# Patient Record
Sex: Male | Born: 1935 | Race: Black or African American | Hispanic: No | Smoking: Never smoker
Health system: Southern US, Community
[De-identification: ages and names within clinical notes are randomized; demographics above are authoritative.]

## PROBLEM LIST (undated history)

## (undated) DIAGNOSIS — Z8546 Personal history of malignant neoplasm of prostate: Secondary | ICD-10-CM

## (undated) DIAGNOSIS — E119 Type 2 diabetes mellitus without complications: Secondary | ICD-10-CM

## (undated) DIAGNOSIS — L97909 Non-pressure chronic ulcer of unspecified part of unspecified lower leg with unspecified severity: Secondary | ICD-10-CM

## (undated) DIAGNOSIS — R5381 Other malaise: Secondary | ICD-10-CM

## (undated) DIAGNOSIS — K219 Gastro-esophageal reflux disease without esophagitis: Secondary | ICD-10-CM

## (undated) DIAGNOSIS — K122 Cellulitis and abscess of mouth: Secondary | ICD-10-CM

## (undated) DIAGNOSIS — Z950 Presence of cardiac pacemaker: Secondary | ICD-10-CM

## (undated) DIAGNOSIS — R972 Elevated prostate specific antigen [PSA]: Secondary | ICD-10-CM

## (undated) DIAGNOSIS — R339 Retention of urine, unspecified: Secondary | ICD-10-CM

## (undated) DIAGNOSIS — R110 Nausea: Secondary | ICD-10-CM

## (undated) DIAGNOSIS — M549 Dorsalgia, unspecified: Secondary | ICD-10-CM

## (undated) DIAGNOSIS — K0500 Acute gingivitis, plaque induced: Secondary | ICD-10-CM

## (undated) DIAGNOSIS — I10 Essential (primary) hypertension: Secondary | ICD-10-CM

## (undated) DIAGNOSIS — L0292 Furuncle, unspecified: Secondary | ICD-10-CM

## (undated) DIAGNOSIS — I219 Acute myocardial infarction, unspecified: Secondary | ICD-10-CM

## (undated) DIAGNOSIS — J309 Allergic rhinitis, unspecified: Secondary | ICD-10-CM

## (undated) DIAGNOSIS — L03317 Cellulitis of buttock: Secondary | ICD-10-CM

## (undated) DIAGNOSIS — Z95810 Presence of automatic (implantable) cardiac defibrillator: Secondary | ICD-10-CM

## (undated) DIAGNOSIS — K5730 Diverticulosis of large intestine without perforation or abscess without bleeding: Secondary | ICD-10-CM

## (undated) DIAGNOSIS — Z86010 Personal history of colonic polyps: Secondary | ICD-10-CM

## (undated) DIAGNOSIS — Q2733 Arteriovenous malformation of digestive system vessel: Secondary | ICD-10-CM

## (undated) DIAGNOSIS — K209 Esophagitis, unspecified: Secondary | ICD-10-CM

## (undated) DIAGNOSIS — F22 Delusional disorders: Secondary | ICD-10-CM

## (undated) DIAGNOSIS — L0231 Cutaneous abscess of buttock: Secondary | ICD-10-CM

## (undated) DIAGNOSIS — N259 Disorder resulting from impaired renal tubular function, unspecified: Secondary | ICD-10-CM

## (undated) DIAGNOSIS — L0293 Carbuncle, unspecified: Secondary | ICD-10-CM

## (undated) DIAGNOSIS — N390 Urinary tract infection, site not specified: Secondary | ICD-10-CM

## (undated) DIAGNOSIS — I2510 Atherosclerotic heart disease of native coronary artery without angina pectoris: Secondary | ICD-10-CM

## (undated) DIAGNOSIS — R5383 Other fatigue: Secondary | ICD-10-CM

## (undated) DIAGNOSIS — I209 Angina pectoris, unspecified: Secondary | ICD-10-CM

## (undated) DIAGNOSIS — I739 Peripheral vascular disease, unspecified: Secondary | ICD-10-CM

## (undated) DIAGNOSIS — I252 Old myocardial infarction: Secondary | ICD-10-CM

## (undated) DIAGNOSIS — D130 Benign neoplasm of esophagus: Secondary | ICD-10-CM

## (undated) DIAGNOSIS — E785 Hyperlipidemia, unspecified: Secondary | ICD-10-CM

## (undated) DIAGNOSIS — B3781 Candidal esophagitis: Secondary | ICD-10-CM

## (undated) DIAGNOSIS — R209 Unspecified disturbances of skin sensation: Secondary | ICD-10-CM

## (undated) DIAGNOSIS — N400 Enlarged prostate without lower urinary tract symptoms: Secondary | ICD-10-CM

## (undated) DIAGNOSIS — J13 Pneumonia due to Streptococcus pneumoniae: Secondary | ICD-10-CM

## (undated) DIAGNOSIS — R300 Dysuria: Secondary | ICD-10-CM

## (undated) DIAGNOSIS — E039 Hypothyroidism, unspecified: Secondary | ICD-10-CM

## (undated) DIAGNOSIS — R079 Chest pain, unspecified: Secondary | ICD-10-CM

## (undated) DIAGNOSIS — I5022 Chronic systolic (congestive) heart failure: Secondary | ICD-10-CM

## (undated) DIAGNOSIS — T18108A Unspecified foreign body in esophagus causing other injury, initial encounter: Secondary | ICD-10-CM

## (undated) DIAGNOSIS — G4710 Hypersomnia, unspecified: Secondary | ICD-10-CM

## (undated) DIAGNOSIS — S98919A Complete traumatic amputation of unspecified foot, level unspecified, initial encounter: Secondary | ICD-10-CM

## (undated) HISTORY — DX: Acute myocardial infarction, unspecified: I21.9

## (undated) HISTORY — DX: Urinary tract infection, site not specified: N39.0

## (undated) HISTORY — DX: Cellulitis of buttock: L03.317

## (undated) HISTORY — DX: Diverticulosis of large intestine without perforation or abscess without bleeding: K57.30

## (undated) HISTORY — DX: Benign prostatic hyperplasia without lower urinary tract symptoms: N40.0

## (undated) HISTORY — DX: Complete traumatic amputation of unspecified foot, level unspecified, initial encounter: S98.919A

## (undated) HISTORY — DX: Peripheral vascular disease, unspecified: I73.9

## (undated) HISTORY — DX: Candidal esophagitis: B37.81

## (undated) HISTORY — DX: Chronic systolic (congestive) heart failure: I50.22

## (undated) HISTORY — DX: Nausea: R11.0

## (undated) HISTORY — DX: Retention of urine, unspecified: R33.9

## (undated) HISTORY — PX: OTHER SURGICAL HISTORY: SHX169

## (undated) HISTORY — PX: CATARACT EXTRACTION: SUR2

## (undated) HISTORY — DX: Dorsalgia, unspecified: M54.9

## (undated) HISTORY — DX: Benign neoplasm of esophagus: D13.0

## (undated) HISTORY — DX: Hypersomnia, unspecified: G47.10

## (undated) HISTORY — DX: Dysuria: R30.0

## (undated) HISTORY — DX: Personal history of malignant neoplasm of prostate: Z85.46

## (undated) HISTORY — DX: Hypothyroidism, unspecified: E03.9

## (undated) HISTORY — DX: Old myocardial infarction: I25.2

## (undated) HISTORY — DX: Disorder resulting from impaired renal tubular function, unspecified: N25.9

## (undated) HISTORY — DX: Pneumonia due to Streptococcus pneumoniae: J13

## (undated) HISTORY — DX: Delusional disorders: F22

## (undated) HISTORY — DX: Arteriovenous malformation of digestive system vessel: Q27.33

## (undated) HISTORY — DX: Cutaneous abscess of buttock: L02.31

## (undated) HISTORY — DX: Elevated prostate specific antigen (PSA): R97.20

## (undated) HISTORY — DX: Unspecified foreign body in esophagus causing other injury, initial encounter: T18.108A

## (undated) HISTORY — DX: Type 2 diabetes mellitus without complications: E11.9

## (undated) HISTORY — DX: Other fatigue: R53.83

## (undated) HISTORY — DX: Esophagitis, unspecified: K20.9

## (undated) HISTORY — DX: Unspecified disturbances of skin sensation: R20.9

## (undated) HISTORY — PX: CORONARY ANGIOPLASTY WITH STENT PLACEMENT: SHX49

## (undated) HISTORY — DX: Personal history of colonic polyps: Z86.010

## (undated) HISTORY — DX: Other malaise: R53.81

## (undated) HISTORY — PX: INSERT / REPLACE / REMOVE PACEMAKER: SUR710

## (undated) HISTORY — DX: Chest pain, unspecified: R07.9

## (undated) HISTORY — DX: Hyperlipidemia, unspecified: E78.5

## (undated) HISTORY — DX: Essential (primary) hypertension: I10

## (undated) HISTORY — DX: Presence of automatic (implantable) cardiac defibrillator: Z95.810

## (undated) HISTORY — DX: Gastro-esophageal reflux disease without esophagitis: K21.9

## (undated) HISTORY — DX: Acute gingivitis, plaque induced: K05.00

## (undated) HISTORY — DX: Carbuncle, unspecified: L02.93

## (undated) HISTORY — DX: Cellulitis and abscess of mouth: K12.2

## (undated) HISTORY — DX: Atherosclerotic heart disease of native coronary artery without angina pectoris: I25.10

## (undated) HISTORY — DX: Furuncle, unspecified: L02.92

## (undated) HISTORY — DX: Allergic rhinitis, unspecified: J30.9

## (undated) HISTORY — DX: Non-pressure chronic ulcer of unspecified part of unspecified lower leg with unspecified severity: L97.909

---

## 1967-11-30 HISTORY — PX: LAPAROTOMY: SHX154

## 1998-03-11 ENCOUNTER — Observation Stay (HOSPITAL_COMMUNITY): Admission: EM | Admit: 1998-03-11 | Discharge: 1998-03-12 | Payer: Self-pay | Admitting: Emergency Medicine

## 1998-06-28 ENCOUNTER — Ambulatory Visit (HOSPITAL_COMMUNITY): Admission: RE | Admit: 1998-06-28 | Discharge: 1998-06-28 | Payer: Self-pay | Admitting: Internal Medicine

## 1998-07-14 ENCOUNTER — Ambulatory Visit (HOSPITAL_COMMUNITY): Admission: RE | Admit: 1998-07-14 | Discharge: 1998-07-14 | Payer: Self-pay | Admitting: Internal Medicine

## 1999-02-15 ENCOUNTER — Encounter: Payer: Self-pay | Admitting: Emergency Medicine

## 1999-02-15 ENCOUNTER — Inpatient Hospital Stay (HOSPITAL_COMMUNITY): Admission: EM | Admit: 1999-02-15 | Discharge: 1999-02-22 | Payer: Self-pay | Admitting: Emergency Medicine

## 1999-02-16 ENCOUNTER — Encounter: Payer: Self-pay | Admitting: Emergency Medicine

## 1999-02-17 ENCOUNTER — Encounter: Payer: Self-pay | Admitting: Internal Medicine

## 1999-03-19 ENCOUNTER — Emergency Department (HOSPITAL_COMMUNITY): Admission: EM | Admit: 1999-03-19 | Discharge: 1999-03-19 | Payer: Self-pay | Admitting: Emergency Medicine

## 1999-04-09 ENCOUNTER — Emergency Department (HOSPITAL_COMMUNITY): Admission: EM | Admit: 1999-04-09 | Discharge: 1999-04-09 | Payer: Self-pay | Admitting: Emergency Medicine

## 1999-04-12 ENCOUNTER — Inpatient Hospital Stay (HOSPITAL_COMMUNITY): Admission: EM | Admit: 1999-04-12 | Discharge: 1999-04-20 | Payer: Self-pay | Admitting: Emergency Medicine

## 1999-04-15 ENCOUNTER — Encounter (HOSPITAL_BASED_OUTPATIENT_CLINIC_OR_DEPARTMENT_OTHER): Payer: Self-pay | Admitting: General Surgery

## 1999-05-18 ENCOUNTER — Inpatient Hospital Stay (HOSPITAL_COMMUNITY): Admission: RE | Admit: 1999-05-18 | Discharge: 1999-05-19 | Payer: Self-pay | Admitting: General Surgery

## 2000-02-11 ENCOUNTER — Encounter: Payer: Self-pay | Admitting: Emergency Medicine

## 2000-02-11 ENCOUNTER — Inpatient Hospital Stay (HOSPITAL_COMMUNITY): Admission: EM | Admit: 2000-02-11 | Discharge: 2000-02-13 | Payer: Self-pay | Admitting: Emergency Medicine

## 2000-05-25 ENCOUNTER — Emergency Department (HOSPITAL_COMMUNITY): Admission: EM | Admit: 2000-05-25 | Discharge: 2000-05-25 | Payer: Self-pay | Admitting: Emergency Medicine

## 2000-06-11 ENCOUNTER — Emergency Department (HOSPITAL_COMMUNITY): Admission: EM | Admit: 2000-06-11 | Discharge: 2000-06-11 | Payer: Self-pay | Admitting: *Deleted

## 2000-06-19 ENCOUNTER — Encounter: Payer: Self-pay | Admitting: *Deleted

## 2000-06-19 ENCOUNTER — Encounter: Payer: Self-pay | Admitting: Cardiology

## 2000-06-19 ENCOUNTER — Ambulatory Visit (HOSPITAL_COMMUNITY): Admission: RE | Admit: 2000-06-19 | Discharge: 2000-06-19 | Payer: Self-pay | Admitting: Cardiology

## 2000-06-19 ENCOUNTER — Inpatient Hospital Stay (HOSPITAL_COMMUNITY): Admission: EM | Admit: 2000-06-19 | Discharge: 2000-06-23 | Payer: Self-pay | Admitting: Internal Medicine

## 2000-06-20 ENCOUNTER — Encounter: Payer: Self-pay | Admitting: Cardiology

## 2000-06-21 ENCOUNTER — Encounter: Payer: Self-pay | Admitting: Internal Medicine

## 2000-07-13 ENCOUNTER — Encounter: Payer: Self-pay | Admitting: Emergency Medicine

## 2000-07-14 ENCOUNTER — Inpatient Hospital Stay (HOSPITAL_COMMUNITY): Admission: EM | Admit: 2000-07-14 | Discharge: 2000-07-14 | Payer: Self-pay | Admitting: Emergency Medicine

## 2000-08-13 ENCOUNTER — Encounter: Payer: Self-pay | Admitting: *Deleted

## 2000-08-13 ENCOUNTER — Emergency Department (HOSPITAL_COMMUNITY): Admission: EM | Admit: 2000-08-13 | Discharge: 2000-08-13 | Payer: Self-pay | Admitting: Emergency Medicine

## 2000-08-15 ENCOUNTER — Inpatient Hospital Stay (HOSPITAL_COMMUNITY): Admission: EM | Admit: 2000-08-15 | Discharge: 2000-08-17 | Payer: Self-pay | Admitting: Emergency Medicine

## 2000-09-25 ENCOUNTER — Emergency Department (HOSPITAL_COMMUNITY): Admission: EM | Admit: 2000-09-25 | Discharge: 2000-09-25 | Payer: Self-pay | Admitting: Emergency Medicine

## 2000-10-25 ENCOUNTER — Encounter: Admission: RE | Admit: 2000-10-25 | Discharge: 2000-10-25 | Payer: Self-pay | Admitting: Urology

## 2000-10-25 ENCOUNTER — Encounter: Payer: Self-pay | Admitting: Urology

## 2000-10-27 ENCOUNTER — Encounter (INDEPENDENT_AMBULATORY_CARE_PROVIDER_SITE_OTHER): Payer: Self-pay | Admitting: *Deleted

## 2000-10-27 ENCOUNTER — Ambulatory Visit (HOSPITAL_BASED_OUTPATIENT_CLINIC_OR_DEPARTMENT_OTHER): Admission: RE | Admit: 2000-10-27 | Discharge: 2000-10-27 | Payer: Self-pay | Admitting: Urology

## 2000-11-29 HISTORY — PX: BELOW KNEE LEG AMPUTATION: SUR23

## 2001-04-01 ENCOUNTER — Emergency Department (HOSPITAL_COMMUNITY): Admission: EM | Admit: 2001-04-01 | Discharge: 2001-04-01 | Payer: Self-pay | Admitting: *Deleted

## 2001-04-04 ENCOUNTER — Encounter (INDEPENDENT_AMBULATORY_CARE_PROVIDER_SITE_OTHER): Payer: Self-pay | Admitting: Specialist

## 2001-04-04 ENCOUNTER — Inpatient Hospital Stay (HOSPITAL_COMMUNITY): Admission: RE | Admit: 2001-04-04 | Discharge: 2001-04-06 | Payer: Self-pay | Admitting: General Surgery

## 2001-04-20 ENCOUNTER — Encounter: Payer: Self-pay | Admitting: Emergency Medicine

## 2001-04-21 ENCOUNTER — Inpatient Hospital Stay (HOSPITAL_COMMUNITY): Admission: EM | Admit: 2001-04-21 | Discharge: 2001-04-24 | Payer: Self-pay | Admitting: Emergency Medicine

## 2001-06-04 ENCOUNTER — Emergency Department (HOSPITAL_COMMUNITY): Admission: EM | Admit: 2001-06-04 | Discharge: 2001-06-04 | Payer: Self-pay | Admitting: Emergency Medicine

## 2001-07-01 ENCOUNTER — Emergency Department (HOSPITAL_COMMUNITY): Admission: EM | Admit: 2001-07-01 | Discharge: 2001-07-01 | Payer: Self-pay | Admitting: Emergency Medicine

## 2001-07-04 ENCOUNTER — Encounter (HOSPITAL_BASED_OUTPATIENT_CLINIC_OR_DEPARTMENT_OTHER): Payer: Self-pay | Admitting: General Surgery

## 2001-07-04 ENCOUNTER — Inpatient Hospital Stay (HOSPITAL_COMMUNITY): Admission: EM | Admit: 2001-07-04 | Discharge: 2001-07-08 | Payer: Self-pay | Admitting: Emergency Medicine

## 2001-07-04 ENCOUNTER — Encounter (INDEPENDENT_AMBULATORY_CARE_PROVIDER_SITE_OTHER): Payer: Self-pay | Admitting: *Deleted

## 2001-07-18 ENCOUNTER — Encounter (INDEPENDENT_AMBULATORY_CARE_PROVIDER_SITE_OTHER): Payer: Self-pay | Admitting: *Deleted

## 2001-07-18 ENCOUNTER — Inpatient Hospital Stay (HOSPITAL_COMMUNITY): Admission: AD | Admit: 2001-07-18 | Discharge: 2001-08-12 | Payer: Self-pay | Admitting: General Surgery

## 2001-07-19 ENCOUNTER — Encounter (HOSPITAL_BASED_OUTPATIENT_CLINIC_OR_DEPARTMENT_OTHER): Payer: Self-pay | Admitting: General Surgery

## 2001-08-12 ENCOUNTER — Inpatient Hospital Stay (HOSPITAL_COMMUNITY)
Admission: RE | Admit: 2001-08-12 | Discharge: 2001-08-18 | Payer: Self-pay | Admitting: Physical Medicine & Rehabilitation

## 2001-08-26 ENCOUNTER — Encounter: Payer: Self-pay | Admitting: Emergency Medicine

## 2001-08-26 ENCOUNTER — Inpatient Hospital Stay (HOSPITAL_COMMUNITY): Admission: EM | Admit: 2001-08-26 | Discharge: 2001-09-01 | Payer: Self-pay | Admitting: Emergency Medicine

## 2001-09-26 ENCOUNTER — Encounter
Admission: RE | Admit: 2001-09-26 | Discharge: 2001-12-25 | Payer: Self-pay | Admitting: Physical Medicine & Rehabilitation

## 2001-10-19 ENCOUNTER — Ambulatory Visit (HOSPITAL_COMMUNITY): Admission: RE | Admit: 2001-10-19 | Discharge: 2001-10-19 | Payer: Self-pay | Admitting: Gastroenterology

## 2001-10-19 ENCOUNTER — Encounter (INDEPENDENT_AMBULATORY_CARE_PROVIDER_SITE_OTHER): Payer: Self-pay | Admitting: *Deleted

## 2002-02-07 ENCOUNTER — Encounter: Payer: Self-pay | Admitting: Cardiology

## 2002-02-07 ENCOUNTER — Ambulatory Visit (HOSPITAL_COMMUNITY): Admission: RE | Admit: 2002-02-07 | Discharge: 2002-02-07 | Payer: Self-pay | Admitting: Cardiology

## 2002-03-01 ENCOUNTER — Inpatient Hospital Stay (HOSPITAL_COMMUNITY): Admission: EM | Admit: 2002-03-01 | Discharge: 2002-03-02 | Payer: Self-pay | Admitting: Emergency Medicine

## 2002-03-01 ENCOUNTER — Encounter: Payer: Self-pay | Admitting: Emergency Medicine

## 2002-03-02 ENCOUNTER — Encounter: Payer: Self-pay | Admitting: Cardiology

## 2002-05-09 ENCOUNTER — Encounter: Payer: Self-pay | Admitting: Emergency Medicine

## 2002-05-09 ENCOUNTER — Inpatient Hospital Stay (HOSPITAL_COMMUNITY): Admission: EM | Admit: 2002-05-09 | Discharge: 2002-05-12 | Payer: Self-pay | Admitting: Emergency Medicine

## 2002-05-11 ENCOUNTER — Encounter: Payer: Self-pay | Admitting: Cardiology

## 2002-06-07 ENCOUNTER — Ambulatory Visit (HOSPITAL_COMMUNITY): Admission: RE | Admit: 2002-06-07 | Discharge: 2002-06-07 | Payer: Self-pay | Admitting: Cardiology

## 2002-07-31 ENCOUNTER — Emergency Department (HOSPITAL_COMMUNITY): Admission: EM | Admit: 2002-07-31 | Discharge: 2002-07-31 | Payer: Self-pay | Admitting: Emergency Medicine

## 2002-07-31 ENCOUNTER — Encounter: Payer: Self-pay | Admitting: Emergency Medicine

## 2002-10-04 ENCOUNTER — Emergency Department (HOSPITAL_COMMUNITY): Admission: EM | Admit: 2002-10-04 | Discharge: 2002-10-04 | Payer: Self-pay

## 2002-10-04 ENCOUNTER — Encounter: Payer: Self-pay | Admitting: Emergency Medicine

## 2003-01-07 ENCOUNTER — Inpatient Hospital Stay (HOSPITAL_COMMUNITY): Admission: EM | Admit: 2003-01-07 | Discharge: 2003-01-11 | Payer: Self-pay | Admitting: Emergency Medicine

## 2003-01-07 ENCOUNTER — Encounter: Payer: Self-pay | Admitting: Emergency Medicine

## 2003-01-09 ENCOUNTER — Encounter: Payer: Self-pay | Admitting: Cardiology

## 2003-04-17 ENCOUNTER — Emergency Department (HOSPITAL_COMMUNITY): Admission: EM | Admit: 2003-04-17 | Discharge: 2003-04-17 | Payer: Self-pay | Admitting: Emergency Medicine

## 2003-08-13 ENCOUNTER — Emergency Department (HOSPITAL_COMMUNITY): Admission: EM | Admit: 2003-08-13 | Discharge: 2003-08-13 | Payer: Self-pay

## 2003-09-02 ENCOUNTER — Encounter: Payer: Self-pay | Admitting: Cardiology

## 2003-09-02 ENCOUNTER — Ambulatory Visit (HOSPITAL_COMMUNITY): Admission: RE | Admit: 2003-09-02 | Discharge: 2003-09-02 | Payer: Self-pay | Admitting: Cardiology

## 2003-09-10 ENCOUNTER — Inpatient Hospital Stay (HOSPITAL_COMMUNITY): Admission: RE | Admit: 2003-09-10 | Discharge: 2003-09-13 | Payer: Self-pay | Admitting: Cardiology

## 2003-10-26 ENCOUNTER — Emergency Department (HOSPITAL_COMMUNITY): Admission: EM | Admit: 2003-10-26 | Discharge: 2003-10-26 | Payer: Self-pay | Admitting: Emergency Medicine

## 2003-11-19 ENCOUNTER — Ambulatory Visit (HOSPITAL_COMMUNITY): Admission: RE | Admit: 2003-11-19 | Discharge: 2003-11-20 | Payer: Self-pay | Admitting: Cardiology

## 2004-07-02 ENCOUNTER — Emergency Department (HOSPITAL_COMMUNITY): Admission: EM | Admit: 2004-07-02 | Discharge: 2004-07-02 | Payer: Self-pay | Admitting: Emergency Medicine

## 2004-07-18 ENCOUNTER — Emergency Department (HOSPITAL_COMMUNITY): Admission: EM | Admit: 2004-07-18 | Discharge: 2004-07-19 | Payer: Self-pay | Admitting: Emergency Medicine

## 2004-09-04 ENCOUNTER — Inpatient Hospital Stay (HOSPITAL_COMMUNITY): Admission: EM | Admit: 2004-09-04 | Discharge: 2004-09-05 | Payer: Self-pay

## 2004-09-22 ENCOUNTER — Ambulatory Visit (HOSPITAL_COMMUNITY): Admission: RE | Admit: 2004-09-22 | Discharge: 2004-09-22 | Payer: Self-pay | Admitting: Gastroenterology

## 2004-09-22 ENCOUNTER — Encounter (INDEPENDENT_AMBULATORY_CARE_PROVIDER_SITE_OTHER): Payer: Self-pay | Admitting: *Deleted

## 2004-09-22 LAB — HM COLONOSCOPY

## 2005-01-28 ENCOUNTER — Ambulatory Visit: Payer: Self-pay | Admitting: Emergency Medicine

## 2005-01-28 ENCOUNTER — Emergency Department (HOSPITAL_COMMUNITY): Admission: EM | Admit: 2005-01-28 | Discharge: 2005-01-29 | Payer: Self-pay | Admitting: Emergency Medicine

## 2005-01-29 DIAGNOSIS — T18108A Unspecified foreign body in esophagus causing other injury, initial encounter: Secondary | ICD-10-CM

## 2005-01-29 HISTORY — DX: Unspecified foreign body in esophagus causing other injury, initial encounter: T18.108A

## 2005-02-10 ENCOUNTER — Ambulatory Visit: Payer: Self-pay | Admitting: Gastroenterology

## 2005-02-16 ENCOUNTER — Emergency Department (HOSPITAL_COMMUNITY): Admission: EM | Admit: 2005-02-16 | Discharge: 2005-02-16 | Payer: Self-pay | Admitting: *Deleted

## 2005-02-19 ENCOUNTER — Encounter (INDEPENDENT_AMBULATORY_CARE_PROVIDER_SITE_OTHER): Payer: Self-pay | Admitting: Specialist

## 2005-02-19 ENCOUNTER — Ambulatory Visit (HOSPITAL_COMMUNITY): Admission: RE | Admit: 2005-02-19 | Discharge: 2005-02-19 | Payer: Self-pay | Admitting: Gastroenterology

## 2005-05-22 ENCOUNTER — Emergency Department (HOSPITAL_COMMUNITY): Admission: EM | Admit: 2005-05-22 | Discharge: 2005-05-22 | Payer: Self-pay | Admitting: Emergency Medicine

## 2005-06-15 ENCOUNTER — Encounter: Admission: RE | Admit: 2005-06-15 | Discharge: 2005-06-15 | Payer: Self-pay | Admitting: General Surgery

## 2005-07-16 ENCOUNTER — Emergency Department (HOSPITAL_COMMUNITY): Admission: EM | Admit: 2005-07-16 | Discharge: 2005-07-16 | Payer: Self-pay | Admitting: Emergency Medicine

## 2005-07-21 ENCOUNTER — Ambulatory Visit: Payer: Self-pay | Admitting: Gastroenterology

## 2005-07-31 ENCOUNTER — Inpatient Hospital Stay (HOSPITAL_COMMUNITY): Admission: EM | Admit: 2005-07-31 | Discharge: 2005-08-04 | Payer: Self-pay | Admitting: Emergency Medicine

## 2005-08-09 ENCOUNTER — Ambulatory Visit (HOSPITAL_COMMUNITY): Admission: RE | Admit: 2005-08-09 | Discharge: 2005-08-09 | Payer: Self-pay | Admitting: *Deleted

## 2006-01-24 ENCOUNTER — Observation Stay (HOSPITAL_COMMUNITY): Admission: EM | Admit: 2006-01-24 | Discharge: 2006-01-25 | Payer: Self-pay | Admitting: Emergency Medicine

## 2006-01-24 ENCOUNTER — Encounter (INDEPENDENT_AMBULATORY_CARE_PROVIDER_SITE_OTHER): Payer: Self-pay | Admitting: Interventional Cardiology

## 2006-03-14 ENCOUNTER — Emergency Department (HOSPITAL_COMMUNITY): Admission: EM | Admit: 2006-03-14 | Discharge: 2006-03-15 | Payer: Self-pay | Admitting: Emergency Medicine

## 2006-04-07 ENCOUNTER — Encounter: Admission: RE | Admit: 2006-04-07 | Discharge: 2006-04-07 | Payer: Self-pay | Admitting: Cardiology

## 2006-04-11 ENCOUNTER — Ambulatory Visit (HOSPITAL_COMMUNITY): Admission: RE | Admit: 2006-04-11 | Discharge: 2006-04-11 | Payer: Self-pay | Admitting: Cardiology

## 2006-05-25 ENCOUNTER — Ambulatory Visit (HOSPITAL_COMMUNITY): Admission: RE | Admit: 2006-05-25 | Discharge: 2006-05-26 | Payer: Self-pay | Admitting: Cardiology

## 2006-10-11 ENCOUNTER — Emergency Department (HOSPITAL_COMMUNITY): Admission: EM | Admit: 2006-10-11 | Discharge: 2006-10-11 | Payer: Self-pay | Admitting: Emergency Medicine

## 2006-10-12 ENCOUNTER — Emergency Department (HOSPITAL_COMMUNITY): Admission: EM | Admit: 2006-10-12 | Discharge: 2006-10-12 | Payer: Self-pay | Admitting: Emergency Medicine

## 2006-12-10 ENCOUNTER — Emergency Department (HOSPITAL_COMMUNITY): Admission: EM | Admit: 2006-12-10 | Discharge: 2006-12-10 | Payer: Self-pay | Admitting: Emergency Medicine

## 2006-12-11 ENCOUNTER — Emergency Department (HOSPITAL_COMMUNITY): Admission: EM | Admit: 2006-12-11 | Discharge: 2006-12-12 | Payer: Self-pay | Admitting: Emergency Medicine

## 2006-12-20 ENCOUNTER — Ambulatory Visit: Payer: Self-pay | Admitting: Gastroenterology

## 2006-12-27 ENCOUNTER — Ambulatory Visit: Payer: Self-pay | Admitting: Psychiatry

## 2006-12-27 ENCOUNTER — Inpatient Hospital Stay (HOSPITAL_COMMUNITY): Admission: AD | Admit: 2006-12-27 | Discharge: 2006-12-28 | Payer: Self-pay | Admitting: Psychiatry

## 2006-12-29 ENCOUNTER — Ambulatory Visit (HOSPITAL_COMMUNITY): Admission: RE | Admit: 2006-12-29 | Discharge: 2006-12-29 | Payer: Self-pay | Admitting: Gastroenterology

## 2006-12-29 ENCOUNTER — Encounter (INDEPENDENT_AMBULATORY_CARE_PROVIDER_SITE_OTHER): Payer: Self-pay | Admitting: *Deleted

## 2006-12-29 DIAGNOSIS — D130 Benign neoplasm of esophagus: Secondary | ICD-10-CM

## 2006-12-29 HISTORY — DX: Benign neoplasm of esophagus: D13.0

## 2007-01-02 ENCOUNTER — Ambulatory Visit: Payer: Self-pay | Admitting: Gastroenterology

## 2007-01-25 ENCOUNTER — Ambulatory Visit: Payer: Self-pay | Admitting: Gastroenterology

## 2007-02-13 ENCOUNTER — Ambulatory Visit: Payer: Self-pay | Admitting: Internal Medicine

## 2007-02-13 LAB — CONVERTED CEMR LAB
ALT: 29 units/L (ref 0–40)
Albumin: 3.8 g/dL (ref 3.5–5.2)
Alkaline Phosphatase: 55 units/L (ref 39–117)
BUN: 19 mg/dL (ref 6–23)
Basophils Absolute: 0.1 10*3/uL (ref 0.0–0.1)
Basophils Relative: 1.5 % — ABNORMAL HIGH (ref 0.0–1.0)
Bilirubin Urine: NEGATIVE
CO2: 31 meq/L (ref 19–32)
Calcium: 9.3 mg/dL (ref 8.4–10.5)
Creatinine, Ser: 1.4 mg/dL (ref 0.4–1.5)
GFR calc Af Amer: 63 mL/min
HDL: 42 mg/dL (ref >39.0)
Hemoglobin, Urine: NEGATIVE
Ketones, ur: NEGATIVE mg/dL
LDL Cholesterol: 89 mg/dL (ref 0–99)
MCHC: 35.4 g/dL (ref 30.0–36.0)
Microalb Creat Ratio: 31.6 mg/g — ABNORMAL HIGH (ref 0.0–30.0)
Microalb, Ur: 6.3 mg/dL — ABNORMAL HIGH (ref 0.0–1.9)
Monocytes Absolute: 0.3 10*3/uL (ref 0.2–0.7)
Monocytes Relative: 8.7 % (ref 3.0–11.0)
PSA: 5.4 ng/mL — ABNORMAL HIGH (ref 0.10–4.00)
Platelets: 155 10*3/uL (ref 150–400)
Potassium: 3.5 meq/L (ref 3.5–5.1)
RDW: 12.2 % (ref 11.5–14.6)
Specific Gravity, Urine: 1.025 (ref 1.000–1.03)
TSH: 3.05 microintl units/mL (ref 0.35–5.50)
Total Bilirubin: 0.9 mg/dL (ref 0.3–1.2)
Total CHOL/HDL Ratio: 3.5
Total Protein: 7.5 g/dL (ref 6.0–8.3)
Triglycerides: 71 mg/dL (ref 0–149)
Urine Glucose: 500 mg/dL — AB
VLDL: 14 mg/dL (ref 0–40)
pH: 6 (ref 5.0–8.0)

## 2007-03-28 ENCOUNTER — Ambulatory Visit: Payer: Self-pay | Admitting: Internal Medicine

## 2007-03-28 LAB — CONVERTED CEMR LAB
BUN: 19 mg/dL (ref 6–23)
CO2: 32 meq/L (ref 19–32)
Calcium: 9.2 mg/dL (ref 8.4–10.5)
Cholesterol: 117 mg/dL (ref 0–200)
Creatinine, Ser: 1.4 mg/dL (ref 0.4–1.5)
Glucose, Bld: 174 mg/dL — ABNORMAL HIGH (ref 70–99)

## 2007-04-06 ENCOUNTER — Ambulatory Visit: Payer: Self-pay | Admitting: Cardiovascular Disease

## 2007-05-18 ENCOUNTER — Ambulatory Visit: Payer: Self-pay | Admitting: Internal Medicine

## 2007-06-08 ENCOUNTER — Ambulatory Visit: Payer: Self-pay | Admitting: Internal Medicine

## 2007-06-11 DIAGNOSIS — I219 Acute myocardial infarction, unspecified: Secondary | ICD-10-CM | POA: Insufficient documentation

## 2007-06-11 DIAGNOSIS — I2510 Atherosclerotic heart disease of native coronary artery without angina pectoris: Secondary | ICD-10-CM

## 2007-06-11 DIAGNOSIS — I10 Essential (primary) hypertension: Secondary | ICD-10-CM | POA: Insufficient documentation

## 2007-06-11 DIAGNOSIS — F22 Delusional disorders: Secondary | ICD-10-CM | POA: Insufficient documentation

## 2007-06-11 DIAGNOSIS — K219 Gastro-esophageal reflux disease without esophagitis: Secondary | ICD-10-CM

## 2007-06-11 DIAGNOSIS — S98919A Complete traumatic amputation of unspecified foot, level unspecified, initial encounter: Secondary | ICD-10-CM

## 2007-06-11 DIAGNOSIS — E785 Hyperlipidemia, unspecified: Secondary | ICD-10-CM

## 2007-06-11 DIAGNOSIS — E119 Type 2 diabetes mellitus without complications: Secondary | ICD-10-CM

## 2007-06-11 HISTORY — DX: Acute myocardial infarction, unspecified: I21.9

## 2007-06-11 HISTORY — DX: Atherosclerotic heart disease of native coronary artery without angina pectoris: I25.10

## 2007-06-11 HISTORY — DX: Type 2 diabetes mellitus without complications: E11.9

## 2007-06-11 HISTORY — DX: Complete traumatic amputation of unspecified foot, level unspecified, initial encounter: S98.919A

## 2007-06-11 HISTORY — DX: Hyperlipidemia, unspecified: E78.5

## 2007-06-11 HISTORY — DX: Delusional disorders: F22

## 2007-06-11 HISTORY — DX: Gastro-esophageal reflux disease without esophagitis: K21.9

## 2007-06-11 HISTORY — DX: Essential (primary) hypertension: I10

## 2007-06-16 ENCOUNTER — Ambulatory Visit: Payer: Self-pay | Admitting: Internal Medicine

## 2007-06-27 ENCOUNTER — Ambulatory Visit: Payer: Self-pay | Admitting: Internal Medicine

## 2007-06-27 LAB — CONVERTED CEMR LAB
Chloride: 104 meq/L (ref 96–112)
Cholesterol: 122 mg/dL (ref 0–200)
GFR calc Af Amer: 76 mL/min
GFR calc non Af Amer: 63 mL/min
Glucose, Bld: 110 mg/dL — ABNORMAL HIGH (ref 70–99)
HDL: 31.9 mg/dL — ABNORMAL LOW (ref >39.0)
LDL Cholesterol: 78 mg/dL (ref 0–99)
Potassium: 3.8 meq/L (ref 3.5–5.1)
Sodium: 139 meq/L (ref 135–145)
Total CHOL/HDL Ratio: 3.8
Triglycerides: 61 mg/dL (ref 0–149)
VLDL: 12 mg/dL (ref 0–40)

## 2007-07-15 ENCOUNTER — Emergency Department (HOSPITAL_COMMUNITY): Admission: EM | Admit: 2007-07-15 | Discharge: 2007-07-15 | Payer: Self-pay | Admitting: Emergency Medicine

## 2007-07-23 ENCOUNTER — Inpatient Hospital Stay (HOSPITAL_COMMUNITY): Admission: EM | Admit: 2007-07-23 | Discharge: 2007-07-26 | Payer: Self-pay | Admitting: Emergency Medicine

## 2007-07-23 ENCOUNTER — Ambulatory Visit: Payer: Self-pay | Admitting: Internal Medicine

## 2007-08-04 ENCOUNTER — Encounter (HOSPITAL_BASED_OUTPATIENT_CLINIC_OR_DEPARTMENT_OTHER): Admission: RE | Admit: 2007-08-04 | Discharge: 2007-08-29 | Payer: Self-pay | Admitting: Internal Medicine

## 2007-08-16 ENCOUNTER — Ambulatory Visit: Payer: Self-pay | Admitting: Vascular Surgery

## 2007-08-25 ENCOUNTER — Ambulatory Visit: Payer: Self-pay | Admitting: Vascular Surgery

## 2007-08-25 ENCOUNTER — Ambulatory Visit (HOSPITAL_COMMUNITY): Admission: RE | Admit: 2007-08-25 | Discharge: 2007-08-25 | Payer: Self-pay | Admitting: Vascular Surgery

## 2007-08-29 ENCOUNTER — Encounter (HOSPITAL_BASED_OUTPATIENT_CLINIC_OR_DEPARTMENT_OTHER): Admission: RE | Admit: 2007-08-29 | Discharge: 2007-11-27 | Payer: Self-pay | Admitting: Surgery

## 2007-08-30 ENCOUNTER — Emergency Department (HOSPITAL_COMMUNITY): Admission: EM | Admit: 2007-08-30 | Discharge: 2007-08-30 | Payer: Self-pay | Admitting: Emergency Medicine

## 2007-09-01 ENCOUNTER — Ambulatory Visit: Payer: Self-pay | Admitting: Internal Medicine

## 2007-09-01 LAB — CONVERTED CEMR LAB
Crystals: NEGATIVE
Hemoglobin, Urine: NEGATIVE
Mucus, UA: NEGATIVE
Total Protein, Urine: NEGATIVE mg/dL
Urine Glucose: 100 mg/dL — AB
Urobilinogen, UA: 1 (ref 0.0–1.0)

## 2007-09-28 ENCOUNTER — Telehealth: Payer: Self-pay | Admitting: Internal Medicine

## 2007-10-04 ENCOUNTER — Ambulatory Visit: Payer: Self-pay | Admitting: Internal Medicine

## 2007-10-04 ENCOUNTER — Emergency Department (HOSPITAL_COMMUNITY): Admission: EM | Admit: 2007-10-04 | Discharge: 2007-10-04 | Payer: Self-pay | Admitting: Emergency Medicine

## 2007-10-24 ENCOUNTER — Encounter: Payer: Self-pay | Admitting: Internal Medicine

## 2007-10-26 ENCOUNTER — Encounter: Payer: Self-pay | Admitting: Internal Medicine

## 2007-10-26 DIAGNOSIS — Q2733 Arteriovenous malformation of digestive system vessel: Secondary | ICD-10-CM

## 2007-10-26 DIAGNOSIS — I252 Old myocardial infarction: Secondary | ICD-10-CM

## 2007-10-26 DIAGNOSIS — Z860100 Personal history of colon polyps, unspecified: Secondary | ICD-10-CM | POA: Insufficient documentation

## 2007-10-26 DIAGNOSIS — B3781 Candidal esophagitis: Secondary | ICD-10-CM

## 2007-10-26 DIAGNOSIS — J309 Allergic rhinitis, unspecified: Secondary | ICD-10-CM | POA: Insufficient documentation

## 2007-10-26 DIAGNOSIS — K5730 Diverticulosis of large intestine without perforation or abscess without bleeding: Secondary | ICD-10-CM

## 2007-10-26 DIAGNOSIS — I739 Peripheral vascular disease, unspecified: Secondary | ICD-10-CM

## 2007-10-26 DIAGNOSIS — K222 Esophageal obstruction: Secondary | ICD-10-CM | POA: Insufficient documentation

## 2007-10-26 DIAGNOSIS — N259 Disorder resulting from impaired renal tubular function, unspecified: Secondary | ICD-10-CM

## 2007-10-26 DIAGNOSIS — R972 Elevated prostate specific antigen [PSA]: Secondary | ICD-10-CM

## 2007-10-26 DIAGNOSIS — E039 Hypothyroidism, unspecified: Secondary | ICD-10-CM | POA: Insufficient documentation

## 2007-10-26 HISTORY — DX: Old myocardial infarction: I25.2

## 2007-10-26 HISTORY — DX: Personal history of colonic polyps: Z86.010

## 2007-10-26 HISTORY — DX: Hypothyroidism, unspecified: E03.9

## 2007-10-26 HISTORY — DX: Elevated prostate specific antigen (PSA): R97.20

## 2007-10-26 HISTORY — DX: Arteriovenous malformation of digestive system vessel: Q27.33

## 2007-10-26 HISTORY — DX: Disorder resulting from impaired renal tubular function, unspecified: N25.9

## 2007-10-26 HISTORY — DX: Personal history of colon polyps, unspecified: Z86.0100

## 2007-10-26 HISTORY — DX: Allergic rhinitis, unspecified: J30.9

## 2007-10-26 HISTORY — DX: Candidal esophagitis: B37.81

## 2007-10-26 HISTORY — DX: Peripheral vascular disease, unspecified: I73.9

## 2007-10-26 HISTORY — DX: Diverticulosis of large intestine without perforation or abscess without bleeding: K57.30

## 2007-10-27 ENCOUNTER — Ambulatory Visit: Payer: Self-pay | Admitting: Internal Medicine

## 2007-10-27 DIAGNOSIS — L97909 Non-pressure chronic ulcer of unspecified part of unspecified lower leg with unspecified severity: Secondary | ICD-10-CM

## 2007-10-27 HISTORY — DX: Non-pressure chronic ulcer of unspecified part of unspecified lower leg with unspecified severity: L97.909

## 2007-10-27 LAB — CONVERTED CEMR LAB
Calcium: 8.9 mg/dL (ref 8.4–10.5)
Cholesterol: 111 mg/dL (ref 0–200)
GFR calc Af Amer: 76 mL/min
GFR calc non Af Amer: 63 mL/min
Glucose, Bld: 162 mg/dL — ABNORMAL HIGH (ref 70–99)
HDL: 29.9 mg/dL — ABNORMAL LOW (ref >39.0)
LDL Cholesterol: 72 mg/dL (ref 0–99)
Sodium: 139 meq/L (ref 135–145)
Total CHOL/HDL Ratio: 3.7

## 2007-11-11 ENCOUNTER — Emergency Department (HOSPITAL_COMMUNITY): Admission: EM | Admit: 2007-11-11 | Discharge: 2007-11-11 | Payer: Self-pay | Admitting: *Deleted

## 2007-11-14 ENCOUNTER — Ambulatory Visit: Payer: Self-pay | Admitting: Internal Medicine

## 2007-11-14 DIAGNOSIS — M549 Dorsalgia, unspecified: Secondary | ICD-10-CM

## 2007-11-14 HISTORY — DX: Dorsalgia, unspecified: M54.9

## 2007-11-20 ENCOUNTER — Encounter: Payer: Self-pay | Admitting: Internal Medicine

## 2007-11-28 ENCOUNTER — Encounter (HOSPITAL_BASED_OUTPATIENT_CLINIC_OR_DEPARTMENT_OTHER): Admission: RE | Admit: 2007-11-28 | Discharge: 2008-02-26 | Payer: Self-pay | Admitting: Internal Medicine

## 2007-12-05 ENCOUNTER — Emergency Department (HOSPITAL_COMMUNITY): Admission: EM | Admit: 2007-12-05 | Discharge: 2007-12-06 | Payer: Self-pay | Admitting: Emergency Medicine

## 2007-12-07 ENCOUNTER — Ambulatory Visit: Payer: Self-pay | Admitting: Internal Medicine

## 2007-12-07 DIAGNOSIS — R339 Retention of urine, unspecified: Secondary | ICD-10-CM

## 2007-12-07 DIAGNOSIS — N390 Urinary tract infection, site not specified: Secondary | ICD-10-CM

## 2007-12-07 HISTORY — DX: Retention of urine, unspecified: R33.9

## 2007-12-07 HISTORY — DX: Urinary tract infection, site not specified: N39.0

## 2007-12-08 ENCOUNTER — Telehealth (INDEPENDENT_AMBULATORY_CARE_PROVIDER_SITE_OTHER): Payer: Self-pay | Admitting: *Deleted

## 2007-12-26 ENCOUNTER — Encounter: Payer: Self-pay | Admitting: Internal Medicine

## 2008-01-14 ENCOUNTER — Emergency Department (HOSPITAL_COMMUNITY): Admission: EM | Admit: 2008-01-14 | Discharge: 2008-01-14 | Payer: Self-pay | Admitting: Emergency Medicine

## 2008-01-22 ENCOUNTER — Ambulatory Visit: Payer: Self-pay | Admitting: Internal Medicine

## 2008-01-25 ENCOUNTER — Encounter: Payer: Self-pay | Admitting: Internal Medicine

## 2008-01-25 ENCOUNTER — Telehealth (INDEPENDENT_AMBULATORY_CARE_PROVIDER_SITE_OTHER): Payer: Self-pay | Admitting: *Deleted

## 2008-02-01 ENCOUNTER — Telehealth (INDEPENDENT_AMBULATORY_CARE_PROVIDER_SITE_OTHER): Payer: Self-pay | Admitting: *Deleted

## 2008-02-05 ENCOUNTER — Encounter: Payer: Self-pay | Admitting: Internal Medicine

## 2008-02-21 ENCOUNTER — Encounter: Payer: Self-pay | Admitting: Internal Medicine

## 2008-03-01 ENCOUNTER — Encounter (HOSPITAL_BASED_OUTPATIENT_CLINIC_OR_DEPARTMENT_OTHER): Admission: RE | Admit: 2008-03-01 | Discharge: 2008-05-10 | Payer: Self-pay | Admitting: Surgery

## 2008-03-21 ENCOUNTER — Encounter: Payer: Self-pay | Admitting: Internal Medicine

## 2008-04-12 ENCOUNTER — Emergency Department (HOSPITAL_COMMUNITY): Admission: EM | Admit: 2008-04-12 | Discharge: 2008-04-13 | Payer: Self-pay | Admitting: Emergency Medicine

## 2008-04-24 ENCOUNTER — Encounter: Payer: Self-pay | Admitting: Internal Medicine

## 2008-05-06 ENCOUNTER — Ambulatory Visit: Payer: Self-pay

## 2008-05-30 ENCOUNTER — Ambulatory Visit: Payer: Self-pay | Admitting: Internal Medicine

## 2008-05-30 DIAGNOSIS — R5381 Other malaise: Secondary | ICD-10-CM

## 2008-05-30 DIAGNOSIS — R209 Unspecified disturbances of skin sensation: Secondary | ICD-10-CM

## 2008-05-30 DIAGNOSIS — R5383 Other fatigue: Secondary | ICD-10-CM

## 2008-05-30 DIAGNOSIS — G4710 Hypersomnia, unspecified: Secondary | ICD-10-CM

## 2008-05-30 HISTORY — DX: Unspecified disturbances of skin sensation: R20.9

## 2008-05-30 HISTORY — DX: Hypersomnia, unspecified: G47.10

## 2008-05-30 HISTORY — DX: Other malaise: R53.81

## 2008-05-30 HISTORY — DX: Other malaise: R53.83

## 2008-06-04 ENCOUNTER — Telehealth: Payer: Self-pay | Admitting: Internal Medicine

## 2008-06-04 LAB — CONVERTED CEMR LAB
ALT: 44 units/L (ref 0–53)
AST: 30 units/L (ref 0–37)
Alkaline Phosphatase: 69 units/L (ref 39–117)
Basophils Relative: 0.6 % (ref 0.0–1.0)
Bilirubin, Direct: 0.1 mg/dL (ref 0.0–0.3)
CO2: 31 meq/L (ref 19–32)
Calcium: 9.3 mg/dL (ref 8.4–10.5)
Glucose, Bld: 183 mg/dL — ABNORMAL HIGH (ref 70–99)
Hemoglobin: 14 g/dL (ref 13.0–17.0)
Lymphocytes Relative: 10.6 % — ABNORMAL LOW (ref 12.0–46.0)
Microalb, Ur: 5.2 mg/dL — ABNORMAL HIGH (ref 0.0–1.9)
Monocytes Relative: 6.8 % (ref 3.0–12.0)
Neutro Abs: 6.9 10*3/uL (ref 1.4–7.7)
Nitrite: NEGATIVE
RBC: 4.45 M/uL (ref 4.22–5.81)
Sodium: 139 meq/L (ref 135–145)
Specific Gravity, Urine: 1.025 (ref 1.000–1.03)
TSH: 46.52 microintl units/mL — ABNORMAL HIGH (ref 0.35–5.50)
Total CHOL/HDL Ratio: 3.1
Total Protein: 7.4 g/dL (ref 6.0–8.3)
pH: 5.5 (ref 5.0–8.0)

## 2008-06-05 ENCOUNTER — Ambulatory Visit: Payer: Self-pay | Admitting: Endocrinology

## 2008-06-05 DIAGNOSIS — E876 Hypokalemia: Secondary | ICD-10-CM

## 2008-06-06 ENCOUNTER — Telehealth: Payer: Self-pay | Admitting: Endocrinology

## 2008-06-10 ENCOUNTER — Emergency Department (HOSPITAL_COMMUNITY): Admission: EM | Admit: 2008-06-10 | Discharge: 2008-06-11 | Payer: Self-pay | Admitting: Emergency Medicine

## 2008-06-10 ENCOUNTER — Encounter (INDEPENDENT_AMBULATORY_CARE_PROVIDER_SITE_OTHER): Payer: Self-pay | Admitting: *Deleted

## 2008-06-11 ENCOUNTER — Encounter: Payer: Self-pay | Admitting: Internal Medicine

## 2008-06-11 ENCOUNTER — Encounter: Payer: Self-pay | Admitting: Gastroenterology

## 2008-06-11 ENCOUNTER — Ambulatory Visit: Payer: Self-pay | Admitting: Gastroenterology

## 2008-06-11 ENCOUNTER — Telehealth: Payer: Self-pay | Admitting: Gastroenterology

## 2008-06-12 ENCOUNTER — Encounter: Payer: Self-pay | Admitting: Gastroenterology

## 2008-06-17 ENCOUNTER — Ambulatory Visit: Payer: Self-pay | Admitting: Internal Medicine

## 2008-06-17 DIAGNOSIS — K209 Esophagitis, unspecified without bleeding: Secondary | ICD-10-CM

## 2008-06-17 HISTORY — DX: Esophagitis, unspecified without bleeding: K20.90

## 2008-06-19 ENCOUNTER — Ambulatory Visit: Payer: Self-pay | Admitting: Gastroenterology

## 2008-06-20 ENCOUNTER — Encounter: Payer: Self-pay | Admitting: Gastroenterology

## 2008-06-21 ENCOUNTER — Telehealth: Payer: Self-pay | Admitting: Gastroenterology

## 2008-07-01 ENCOUNTER — Encounter: Payer: Self-pay | Admitting: Endocrinology

## 2008-07-02 ENCOUNTER — Encounter: Payer: Self-pay | Admitting: Internal Medicine

## 2008-07-02 ENCOUNTER — Encounter: Payer: Self-pay | Admitting: Gastroenterology

## 2008-07-02 ENCOUNTER — Ambulatory Visit (HOSPITAL_COMMUNITY): Admission: RE | Admit: 2008-07-02 | Discharge: 2008-07-02 | Payer: Self-pay | Admitting: Gastroenterology

## 2008-07-03 ENCOUNTER — Encounter: Payer: Self-pay | Admitting: Gastroenterology

## 2008-07-09 ENCOUNTER — Ambulatory Visit: Payer: Self-pay | Admitting: Gastroenterology

## 2008-07-09 ENCOUNTER — Encounter: Payer: Self-pay | Admitting: Internal Medicine

## 2008-07-09 ENCOUNTER — Ambulatory Visit (HOSPITAL_COMMUNITY): Admission: RE | Admit: 2008-07-09 | Discharge: 2008-07-09 | Payer: Self-pay | Admitting: Gastroenterology

## 2008-07-10 ENCOUNTER — Encounter: Payer: Self-pay | Admitting: Gastroenterology

## 2008-07-10 ENCOUNTER — Telehealth: Payer: Self-pay | Admitting: Gastroenterology

## 2008-07-12 ENCOUNTER — Ambulatory Visit: Payer: Self-pay | Admitting: Cardiovascular Disease

## 2008-07-12 ENCOUNTER — Encounter: Payer: Self-pay | Admitting: Nurse Practitioner

## 2008-07-15 ENCOUNTER — Encounter: Payer: Self-pay | Admitting: Gastroenterology

## 2008-07-30 ENCOUNTER — Ambulatory Visit: Payer: Self-pay | Admitting: Internal Medicine

## 2008-07-31 ENCOUNTER — Ambulatory Visit: Payer: Self-pay | Admitting: Internal Medicine

## 2008-08-06 ENCOUNTER — Encounter: Payer: Self-pay | Admitting: Gastroenterology

## 2008-08-06 ENCOUNTER — Telehealth: Payer: Self-pay | Admitting: Gastroenterology

## 2008-08-07 LAB — CONVERTED CEMR LAB
AST: 18 units/L (ref 0–37)
Alkaline Phosphatase: 42 units/L (ref 39–117)
Basophils Absolute: 0 10*3/uL (ref 0.0–0.1)
Chloride: 110 meq/L (ref 96–112)
Cholesterol: 100 mg/dL (ref 0–200)
Eosinophils Absolute: 0 10*3/uL (ref 0.0–0.7)
GFR calc Af Amer: 42 mL/min
GFR calc non Af Amer: 35 mL/min
HDL: 29.7 mg/dL — ABNORMAL LOW (ref >39.0)
Hgb A1c MFr Bld: 9 % — ABNORMAL HIGH (ref 4.6–6.0)
MCHC: 34.6 g/dL (ref 30.0–36.0)
MCV: 89.3 fL (ref 78.0–100.0)
Monocytes Absolute: 0.2 10*3/uL (ref 0.1–1.0)
Neutrophils Relative %: 56.9 % (ref 43.0–77.0)
Platelets: 128 10*3/uL — ABNORMAL LOW (ref 150–400)
Potassium: 4.3 meq/L (ref 3.5–5.1)
RDW: 12.1 % (ref 11.5–14.6)
Total Bilirubin: 0.7 mg/dL (ref 0.3–1.2)
Triglycerides: 54 mg/dL (ref 0–149)
VLDL: 11 mg/dL (ref 0–40)

## 2008-08-21 ENCOUNTER — Ambulatory Visit: Payer: Self-pay | Admitting: Gastroenterology

## 2008-08-21 ENCOUNTER — Ambulatory Visit (HOSPITAL_COMMUNITY): Admission: RE | Admit: 2008-08-21 | Discharge: 2008-08-21 | Payer: Self-pay | Admitting: Gastroenterology

## 2008-09-02 ENCOUNTER — Inpatient Hospital Stay (HOSPITAL_COMMUNITY): Admission: EM | Admit: 2008-09-02 | Discharge: 2008-09-06 | Payer: Self-pay | Admitting: Emergency Medicine

## 2008-09-02 ENCOUNTER — Ambulatory Visit: Payer: Self-pay | Admitting: Internal Medicine

## 2008-09-02 ENCOUNTER — Ambulatory Visit: Payer: Self-pay | Admitting: Cardiology

## 2008-09-03 ENCOUNTER — Encounter: Payer: Self-pay | Admitting: Internal Medicine

## 2008-09-04 ENCOUNTER — Encounter: Payer: Self-pay | Admitting: Internal Medicine

## 2008-09-06 ENCOUNTER — Emergency Department (HOSPITAL_COMMUNITY): Admission: EM | Admit: 2008-09-06 | Discharge: 2008-09-07 | Payer: Self-pay | Admitting: Emergency Medicine

## 2008-09-10 ENCOUNTER — Ambulatory Visit: Payer: Self-pay | Admitting: Internal Medicine

## 2008-09-10 DIAGNOSIS — L0231 Cutaneous abscess of buttock: Secondary | ICD-10-CM

## 2008-09-10 DIAGNOSIS — L03317 Cellulitis of buttock: Secondary | ICD-10-CM

## 2008-09-10 DIAGNOSIS — I509 Heart failure, unspecified: Secondary | ICD-10-CM | POA: Insufficient documentation

## 2008-09-10 HISTORY — DX: Cutaneous abscess of buttock: L02.31

## 2008-09-17 ENCOUNTER — Ambulatory Visit: Payer: Self-pay | Admitting: Internal Medicine

## 2008-09-18 ENCOUNTER — Encounter: Payer: Self-pay | Admitting: Internal Medicine

## 2008-09-23 ENCOUNTER — Ambulatory Visit: Payer: Self-pay

## 2008-10-02 ENCOUNTER — Ambulatory Visit: Payer: Self-pay | Admitting: Internal Medicine

## 2008-10-02 DIAGNOSIS — L0292 Furuncle, unspecified: Secondary | ICD-10-CM | POA: Insufficient documentation

## 2008-10-02 DIAGNOSIS — L0293 Carbuncle, unspecified: Secondary | ICD-10-CM

## 2008-10-02 HISTORY — DX: Furuncle, unspecified: L02.92

## 2008-10-03 ENCOUNTER — Telehealth (INDEPENDENT_AMBULATORY_CARE_PROVIDER_SITE_OTHER): Payer: Self-pay | Admitting: *Deleted

## 2008-10-07 ENCOUNTER — Telehealth (INDEPENDENT_AMBULATORY_CARE_PROVIDER_SITE_OTHER): Payer: Self-pay | Admitting: *Deleted

## 2008-10-08 ENCOUNTER — Telehealth (INDEPENDENT_AMBULATORY_CARE_PROVIDER_SITE_OTHER): Payer: Self-pay | Admitting: *Deleted

## 2008-10-08 ENCOUNTER — Encounter: Payer: Self-pay | Admitting: Internal Medicine

## 2008-10-09 ENCOUNTER — Telehealth: Payer: Self-pay | Admitting: Gastroenterology

## 2008-10-11 ENCOUNTER — Ambulatory Visit: Payer: Self-pay | Admitting: Gastroenterology

## 2008-10-15 ENCOUNTER — Ambulatory Visit: Payer: Self-pay | Admitting: Gastroenterology

## 2008-10-15 ENCOUNTER — Encounter: Payer: Self-pay | Admitting: Gastroenterology

## 2008-10-16 ENCOUNTER — Emergency Department (HOSPITAL_COMMUNITY): Admission: EM | Admit: 2008-10-16 | Discharge: 2008-10-16 | Payer: Self-pay | Admitting: Emergency Medicine

## 2008-10-21 ENCOUNTER — Encounter: Payer: Self-pay | Admitting: Gastroenterology

## 2008-11-05 ENCOUNTER — Encounter: Payer: Self-pay | Admitting: Internal Medicine

## 2008-11-05 ENCOUNTER — Telehealth: Payer: Self-pay | Admitting: Internal Medicine

## 2008-11-05 ENCOUNTER — Telehealth: Payer: Self-pay | Admitting: Infectious Diseases

## 2008-11-05 ENCOUNTER — Ambulatory Visit: Payer: Self-pay | Admitting: Infectious Diseases

## 2008-11-05 LAB — CONVERTED CEMR LAB

## 2008-11-18 ENCOUNTER — Telehealth: Payer: Self-pay | Admitting: Gastroenterology

## 2008-11-25 ENCOUNTER — Emergency Department (HOSPITAL_COMMUNITY): Admission: EM | Admit: 2008-11-25 | Discharge: 2008-11-25 | Payer: Self-pay | Admitting: Emergency Medicine

## 2008-11-26 ENCOUNTER — Ambulatory Visit: Payer: Self-pay | Admitting: Internal Medicine

## 2008-11-26 DIAGNOSIS — R079 Chest pain, unspecified: Secondary | ICD-10-CM

## 2008-11-26 HISTORY — DX: Chest pain, unspecified: R07.9

## 2008-12-17 ENCOUNTER — Ambulatory Visit: Payer: Self-pay | Admitting: Infectious Diseases

## 2008-12-23 ENCOUNTER — Emergency Department (HOSPITAL_COMMUNITY): Admission: EM | Admit: 2008-12-23 | Discharge: 2008-12-24 | Payer: Self-pay | Admitting: Emergency Medicine

## 2008-12-27 ENCOUNTER — Telehealth (INDEPENDENT_AMBULATORY_CARE_PROVIDER_SITE_OTHER): Payer: Self-pay | Admitting: *Deleted

## 2009-01-01 ENCOUNTER — Ambulatory Visit: Payer: Self-pay | Admitting: Internal Medicine

## 2009-01-01 DIAGNOSIS — K122 Cellulitis and abscess of mouth: Secondary | ICD-10-CM | POA: Insufficient documentation

## 2009-01-01 HISTORY — DX: Cellulitis and abscess of mouth: K12.2

## 2009-01-01 LAB — CONVERTED CEMR LAB
BUN: 18 mg/dL (ref 6–23)
Chloride: 106 meq/L (ref 96–112)
Cholesterol: 95 mg/dL (ref 0–200)
Creatinine, Ser: 1.4 mg/dL (ref 0.4–1.5)
GFR calc non Af Amer: 52 mL/min
Glucose, Bld: 153 mg/dL — ABNORMAL HIGH (ref 70–99)
LDL Cholesterol: 54 mg/dL (ref 0–99)
Potassium: 4.5 meq/L (ref 3.5–5.1)
Triglycerides: 45 mg/dL (ref 0–149)
VLDL: 9 mg/dL (ref 0–40)

## 2009-01-06 ENCOUNTER — Telehealth: Payer: Self-pay | Admitting: Internal Medicine

## 2009-01-06 ENCOUNTER — Ambulatory Visit: Payer: Self-pay | Admitting: Gastroenterology

## 2009-01-10 ENCOUNTER — Encounter: Payer: Self-pay | Admitting: Internal Medicine

## 2009-01-13 ENCOUNTER — Ambulatory Visit: Payer: Self-pay | Admitting: Cardiovascular Disease

## 2009-01-13 DIAGNOSIS — I5022 Chronic systolic (congestive) heart failure: Secondary | ICD-10-CM

## 2009-01-13 HISTORY — DX: Chronic systolic (congestive) heart failure: I50.22

## 2009-01-21 ENCOUNTER — Encounter: Payer: Self-pay | Admitting: Endocrinology

## 2009-01-25 ENCOUNTER — Emergency Department (HOSPITAL_COMMUNITY): Admission: EM | Admit: 2009-01-25 | Discharge: 2009-01-25 | Payer: Self-pay | Admitting: Emergency Medicine

## 2009-01-28 ENCOUNTER — Encounter: Payer: Self-pay | Admitting: Internal Medicine

## 2009-01-30 ENCOUNTER — Ambulatory Visit: Payer: Self-pay | Admitting: Internal Medicine

## 2009-02-18 ENCOUNTER — Encounter: Payer: Self-pay | Admitting: Endocrinology

## 2009-03-03 ENCOUNTER — Telehealth: Payer: Self-pay | Admitting: Internal Medicine

## 2009-03-05 ENCOUNTER — Encounter: Payer: Self-pay | Admitting: Endocrinology

## 2009-03-07 ENCOUNTER — Ambulatory Visit: Admission: RE | Admit: 2009-03-07 | Discharge: 2009-05-09 | Payer: Self-pay | Admitting: Radiation Oncology

## 2009-03-10 ENCOUNTER — Encounter: Payer: Self-pay | Admitting: Internal Medicine

## 2009-03-13 ENCOUNTER — Ambulatory Visit: Payer: Self-pay | Admitting: Internal Medicine

## 2009-03-20 ENCOUNTER — Encounter: Payer: Self-pay | Admitting: Endocrinology

## 2009-03-24 ENCOUNTER — Encounter: Payer: Self-pay | Admitting: Internal Medicine

## 2009-03-31 ENCOUNTER — Telehealth: Payer: Self-pay | Admitting: Internal Medicine

## 2009-04-09 ENCOUNTER — Emergency Department (HOSPITAL_COMMUNITY): Admission: EM | Admit: 2009-04-09 | Discharge: 2009-04-09 | Payer: Self-pay | Admitting: Emergency Medicine

## 2009-04-20 ENCOUNTER — Emergency Department (HOSPITAL_COMMUNITY): Admission: EM | Admit: 2009-04-20 | Discharge: 2009-04-20 | Payer: Self-pay | Admitting: Emergency Medicine

## 2009-04-30 ENCOUNTER — Telehealth: Payer: Self-pay | Admitting: Internal Medicine

## 2009-05-26 ENCOUNTER — Ambulatory Visit: Payer: Self-pay | Admitting: Internal Medicine

## 2009-05-27 DIAGNOSIS — Z95810 Presence of automatic (implantable) cardiac defibrillator: Secondary | ICD-10-CM | POA: Insufficient documentation

## 2009-05-27 HISTORY — DX: Presence of automatic (implantable) cardiac defibrillator: Z95.810

## 2009-06-05 ENCOUNTER — Ambulatory Visit: Admission: RE | Admit: 2009-06-05 | Discharge: 2009-07-01 | Payer: Self-pay | Admitting: Radiation Oncology

## 2009-06-11 ENCOUNTER — Telehealth: Payer: Self-pay | Admitting: Internal Medicine

## 2009-06-12 ENCOUNTER — Ambulatory Visit: Payer: Self-pay | Admitting: Internal Medicine

## 2009-06-13 LAB — CONVERTED CEMR LAB
ALT: 18 units/L (ref 0–53)
BUN: 26 mg/dL — ABNORMAL HIGH (ref 6–23)
Basophils Absolute: 0 10*3/uL (ref 0.0–0.1)
Bilirubin, Direct: 0.1 mg/dL (ref 0.0–0.3)
CO2: 29 meq/L (ref 19–32)
Chloride: 104 meq/L (ref 96–112)
Cholesterol: 134 mg/dL (ref 0–200)
Creatinine, Ser: 1.4 mg/dL (ref 0.4–1.5)
Creatinine,U: 250 mg/dL
Eosinophils Absolute: 0 10*3/uL (ref 0.0–0.7)
Eosinophils Relative: 0.9 % (ref 0.0–5.0)
Glucose, Bld: 165 mg/dL — ABNORMAL HIGH (ref 70–99)
HCT: 40.5 % (ref 39.0–52.0)
HDL: 36.8 mg/dL — ABNORMAL LOW (ref >39.00)
Hemoglobin, Urine: NEGATIVE
Hgb A1c MFr Bld: 9.8 % — ABNORMAL HIGH (ref 4.6–6.5)
LDL Cholesterol: 84 mg/dL (ref 0–99)
Lymphs Abs: 1 10*3/uL (ref 0.7–4.0)
MCHC: 34.9 g/dL (ref 30.0–36.0)
MCV: 86.4 fL (ref 78.0–100.0)
Microalb, Ur: 7.3 mg/dL — ABNORMAL HIGH (ref 0.0–1.9)
Monocytes Absolute: 0.4 10*3/uL (ref 0.1–1.0)
Neutrophils Relative %: 65.7 % (ref 43.0–77.0)
Platelets: 135 10*3/uL — ABNORMAL LOW (ref 150.0–400.0)
Potassium: 3.6 meq/L (ref 3.5–5.1)
Pro B Natriuretic peptide (BNP): 67 pg/mL (ref 0.0–100.0)
RDW: 13.7 % (ref 11.5–14.6)
Total Bilirubin: 1 mg/dL (ref 0.3–1.2)
Total CHOL/HDL Ratio: 4
Triglycerides: 64 mg/dL (ref 0.0–149.0)
Urine Glucose: 100 mg/dL
WBC: 4.1 10*3/uL — ABNORMAL LOW (ref 4.5–10.5)

## 2009-07-01 ENCOUNTER — Telehealth (INDEPENDENT_AMBULATORY_CARE_PROVIDER_SITE_OTHER): Payer: Self-pay | Admitting: *Deleted

## 2009-07-19 ENCOUNTER — Emergency Department (HOSPITAL_COMMUNITY): Admission: EM | Admit: 2009-07-19 | Discharge: 2009-07-19 | Payer: Self-pay | Admitting: Emergency Medicine

## 2009-07-30 ENCOUNTER — Ambulatory Visit: Payer: Self-pay | Admitting: Internal Medicine

## 2009-07-30 DIAGNOSIS — Z8546 Personal history of malignant neoplasm of prostate: Secondary | ICD-10-CM

## 2009-07-30 DIAGNOSIS — K0500 Acute gingivitis, plaque induced: Secondary | ICD-10-CM

## 2009-07-30 HISTORY — DX: Personal history of malignant neoplasm of prostate: Z85.46

## 2009-07-30 HISTORY — DX: Acute gingivitis, plaque induced: K05.00

## 2009-07-30 LAB — CONVERTED CEMR LAB
Bilirubin Urine: NEGATIVE
Ketones, ur: NEGATIVE mg/dL
Nitrite: NEGATIVE
Specific Gravity, Urine: 1.02 (ref 1.000–1.030)
Urobilinogen, UA: 0.2 (ref 0.0–1.0)

## 2009-08-08 ENCOUNTER — Telehealth: Payer: Self-pay | Admitting: Internal Medicine

## 2009-08-14 ENCOUNTER — Encounter (INDEPENDENT_AMBULATORY_CARE_PROVIDER_SITE_OTHER): Payer: Self-pay | Admitting: *Deleted

## 2009-08-27 ENCOUNTER — Telehealth: Payer: Self-pay | Admitting: Internal Medicine

## 2009-08-27 ENCOUNTER — Ambulatory Visit: Payer: Self-pay | Admitting: Internal Medicine

## 2009-08-27 DIAGNOSIS — N400 Enlarged prostate without lower urinary tract symptoms: Secondary | ICD-10-CM

## 2009-08-27 DIAGNOSIS — R110 Nausea: Secondary | ICD-10-CM

## 2009-08-27 DIAGNOSIS — R300 Dysuria: Secondary | ICD-10-CM

## 2009-08-27 HISTORY — DX: Benign prostatic hyperplasia without lower urinary tract symptoms: N40.0

## 2009-08-27 HISTORY — DX: Nausea: R11.0

## 2009-08-27 HISTORY — DX: Dysuria: R30.0

## 2009-08-27 LAB — CONVERTED CEMR LAB
BUN: 16 mg/dL (ref 6–23)
CO2: 33 meq/L — ABNORMAL HIGH (ref 19–32)
Calcium: 8.7 mg/dL (ref 8.4–10.5)
Creatinine, Ser: 1.1 mg/dL (ref 0.4–1.5)
GFR calc non Af Amer: 83.14 mL/min (ref >60)
Glucose, Bld: 103 mg/dL — ABNORMAL HIGH (ref 70–99)
Ketones, ur: NEGATIVE mg/dL
LDL Cholesterol: 59 mg/dL (ref 0–99)
Nitrite: NEGATIVE
Specific Gravity, Urine: 1.02 (ref 1.000–1.030)
Total CHOL/HDL Ratio: 3
Urobilinogen, UA: 1 (ref 0.0–1.0)

## 2009-08-29 ENCOUNTER — Telehealth: Payer: Self-pay | Admitting: Internal Medicine

## 2009-09-16 ENCOUNTER — Encounter: Payer: Self-pay | Admitting: Internal Medicine

## 2009-09-23 ENCOUNTER — Encounter (INDEPENDENT_AMBULATORY_CARE_PROVIDER_SITE_OTHER): Payer: Self-pay | Admitting: *Deleted

## 2009-10-01 ENCOUNTER — Telehealth: Payer: Self-pay | Admitting: Cardiovascular Disease

## 2009-10-01 ENCOUNTER — Telehealth: Payer: Self-pay | Admitting: Internal Medicine

## 2009-10-01 ENCOUNTER — Encounter: Payer: Self-pay | Admitting: Internal Medicine

## 2009-10-07 ENCOUNTER — Telehealth: Payer: Self-pay | Admitting: Internal Medicine

## 2009-10-27 ENCOUNTER — Encounter (INDEPENDENT_AMBULATORY_CARE_PROVIDER_SITE_OTHER): Payer: Self-pay | Admitting: *Deleted

## 2009-11-05 ENCOUNTER — Ambulatory Visit: Payer: Self-pay

## 2009-11-05 ENCOUNTER — Encounter: Payer: Self-pay | Admitting: Internal Medicine

## 2009-11-17 ENCOUNTER — Encounter: Payer: Self-pay | Admitting: Internal Medicine

## 2009-12-02 ENCOUNTER — Ambulatory Visit: Payer: Self-pay | Admitting: Internal Medicine

## 2009-12-03 LAB — CONVERTED CEMR LAB
BUN: 20 mg/dL (ref 6–23)
Bilirubin Urine: NEGATIVE
Creatinine, Ser: 2.2 mg/dL — ABNORMAL HIGH (ref 0.4–1.5)
GFR calc non Af Amer: 37.34 mL/min (ref >60)
Hgb A1c MFr Bld: 10.2 % — ABNORMAL HIGH (ref 4.6–6.5)
Microalb Creat Ratio: 182.7 mg/g — ABNORMAL HIGH (ref 0.0–30.0)
Microalb, Ur: 50.7 mg/dL — ABNORMAL HIGH (ref 0.0–1.9)
Nitrite: NEGATIVE
Potassium: 4.3 meq/L (ref 3.5–5.1)
Total CHOL/HDL Ratio: 3
Total Protein, Urine: 100 mg/dL
Triglycerides: 78 mg/dL (ref 0.0–149.0)
Urine Glucose: NEGATIVE mg/dL
pH: 5 (ref 5.0–8.0)

## 2009-12-19 ENCOUNTER — Telehealth: Payer: Self-pay | Admitting: Internal Medicine

## 2009-12-22 ENCOUNTER — Telehealth: Payer: Self-pay | Admitting: Internal Medicine

## 2009-12-30 ENCOUNTER — Ambulatory Visit: Payer: Self-pay | Admitting: Internal Medicine

## 2009-12-30 LAB — CONVERTED CEMR LAB
Bilirubin Urine: NEGATIVE
Nitrite: NEGATIVE
Urobilinogen, UA: 0.2 (ref 0.0–1.0)

## 2009-12-31 ENCOUNTER — Telehealth: Payer: Self-pay | Admitting: Internal Medicine

## 2010-01-01 ENCOUNTER — Ambulatory Visit: Payer: Self-pay | Admitting: Pulmonary Disease

## 2010-01-06 ENCOUNTER — Encounter: Payer: Self-pay | Admitting: Internal Medicine

## 2010-01-09 ENCOUNTER — Telehealth: Payer: Self-pay | Admitting: Internal Medicine

## 2010-01-12 ENCOUNTER — Encounter: Payer: Self-pay | Admitting: Internal Medicine

## 2010-01-15 ENCOUNTER — Inpatient Hospital Stay (HOSPITAL_COMMUNITY): Admission: EM | Admit: 2010-01-15 | Discharge: 2010-01-19 | Payer: Self-pay | Admitting: Emergency Medicine

## 2010-01-23 ENCOUNTER — Ambulatory Visit: Payer: Self-pay | Admitting: Internal Medicine

## 2010-01-23 DIAGNOSIS — J13 Pneumonia due to Streptococcus pneumoniae: Secondary | ICD-10-CM | POA: Insufficient documentation

## 2010-01-23 HISTORY — DX: Pneumonia due to Streptococcus pneumoniae: J13

## 2010-01-28 ENCOUNTER — Telehealth: Payer: Self-pay | Admitting: Internal Medicine

## 2010-01-29 ENCOUNTER — Encounter: Payer: Self-pay | Admitting: Internal Medicine

## 2010-01-30 ENCOUNTER — Encounter: Payer: Self-pay | Admitting: Internal Medicine

## 2010-02-02 ENCOUNTER — Ambulatory Visit: Payer: Self-pay | Admitting: Internal Medicine

## 2010-02-10 ENCOUNTER — Ambulatory Visit: Payer: Self-pay | Admitting: Internal Medicine

## 2010-02-11 ENCOUNTER — Encounter (HOSPITAL_BASED_OUTPATIENT_CLINIC_OR_DEPARTMENT_OTHER): Admission: RE | Admit: 2010-02-11 | Discharge: 2010-05-05 | Payer: Self-pay | Admitting: Internal Medicine

## 2010-02-11 ENCOUNTER — Telehealth: Payer: Self-pay | Admitting: Internal Medicine

## 2010-02-12 ENCOUNTER — Telehealth: Payer: Self-pay | Admitting: Internal Medicine

## 2010-02-18 ENCOUNTER — Ambulatory Visit: Payer: Self-pay | Admitting: Internal Medicine

## 2010-02-18 LAB — CONVERTED CEMR LAB
Specific Gravity, Urine: 1.015 (ref 1.000–1.030)
Urine Glucose: NEGATIVE mg/dL
Urobilinogen, UA: 0.2 (ref 0.0–1.0)

## 2010-03-05 ENCOUNTER — Ambulatory Visit (HOSPITAL_COMMUNITY): Admission: RE | Admit: 2010-03-05 | Discharge: 2010-03-05 | Payer: Self-pay | Admitting: Internal Medicine

## 2010-03-21 ENCOUNTER — Emergency Department (HOSPITAL_COMMUNITY): Admission: EM | Admit: 2010-03-21 | Discharge: 2010-03-21 | Payer: Self-pay | Admitting: Emergency Medicine

## 2010-04-02 ENCOUNTER — Ambulatory Visit (HOSPITAL_COMMUNITY): Admission: RE | Admit: 2010-04-02 | Discharge: 2010-04-02 | Payer: Self-pay | Admitting: Internal Medicine

## 2010-04-03 ENCOUNTER — Ambulatory Visit: Payer: Self-pay | Admitting: Internal Medicine

## 2010-04-07 ENCOUNTER — Emergency Department (HOSPITAL_COMMUNITY): Admission: EM | Admit: 2010-04-07 | Discharge: 2010-04-07 | Payer: Self-pay | Admitting: Emergency Medicine

## 2010-04-08 ENCOUNTER — Encounter: Payer: Self-pay | Admitting: Internal Medicine

## 2010-04-14 ENCOUNTER — Ambulatory Visit: Payer: Self-pay | Admitting: Internal Medicine

## 2010-05-04 ENCOUNTER — Ambulatory Visit: Payer: Self-pay

## 2010-05-04 ENCOUNTER — Encounter: Payer: Self-pay | Admitting: Internal Medicine

## 2010-06-16 ENCOUNTER — Emergency Department (HOSPITAL_COMMUNITY): Admission: EM | Admit: 2010-06-16 | Discharge: 2010-06-16 | Payer: Self-pay | Admitting: Emergency Medicine

## 2010-06-19 ENCOUNTER — Telehealth: Payer: Self-pay | Admitting: Internal Medicine

## 2010-06-22 ENCOUNTER — Telehealth: Payer: Self-pay | Admitting: Internal Medicine

## 2010-06-24 ENCOUNTER — Ambulatory Visit: Payer: Self-pay

## 2010-06-24 ENCOUNTER — Encounter: Payer: Self-pay | Admitting: Internal Medicine

## 2010-07-14 IMAGING — CR DG CHEST 1V PORT
1 series · 1 of 1 positions shown · non-contrast
Comparison: 09/03/2008.

CLINICAL DATA: Chest pain shortness of breath.  Hypertension.

PORTABLE CHEST - 1 VIEW

[AP]
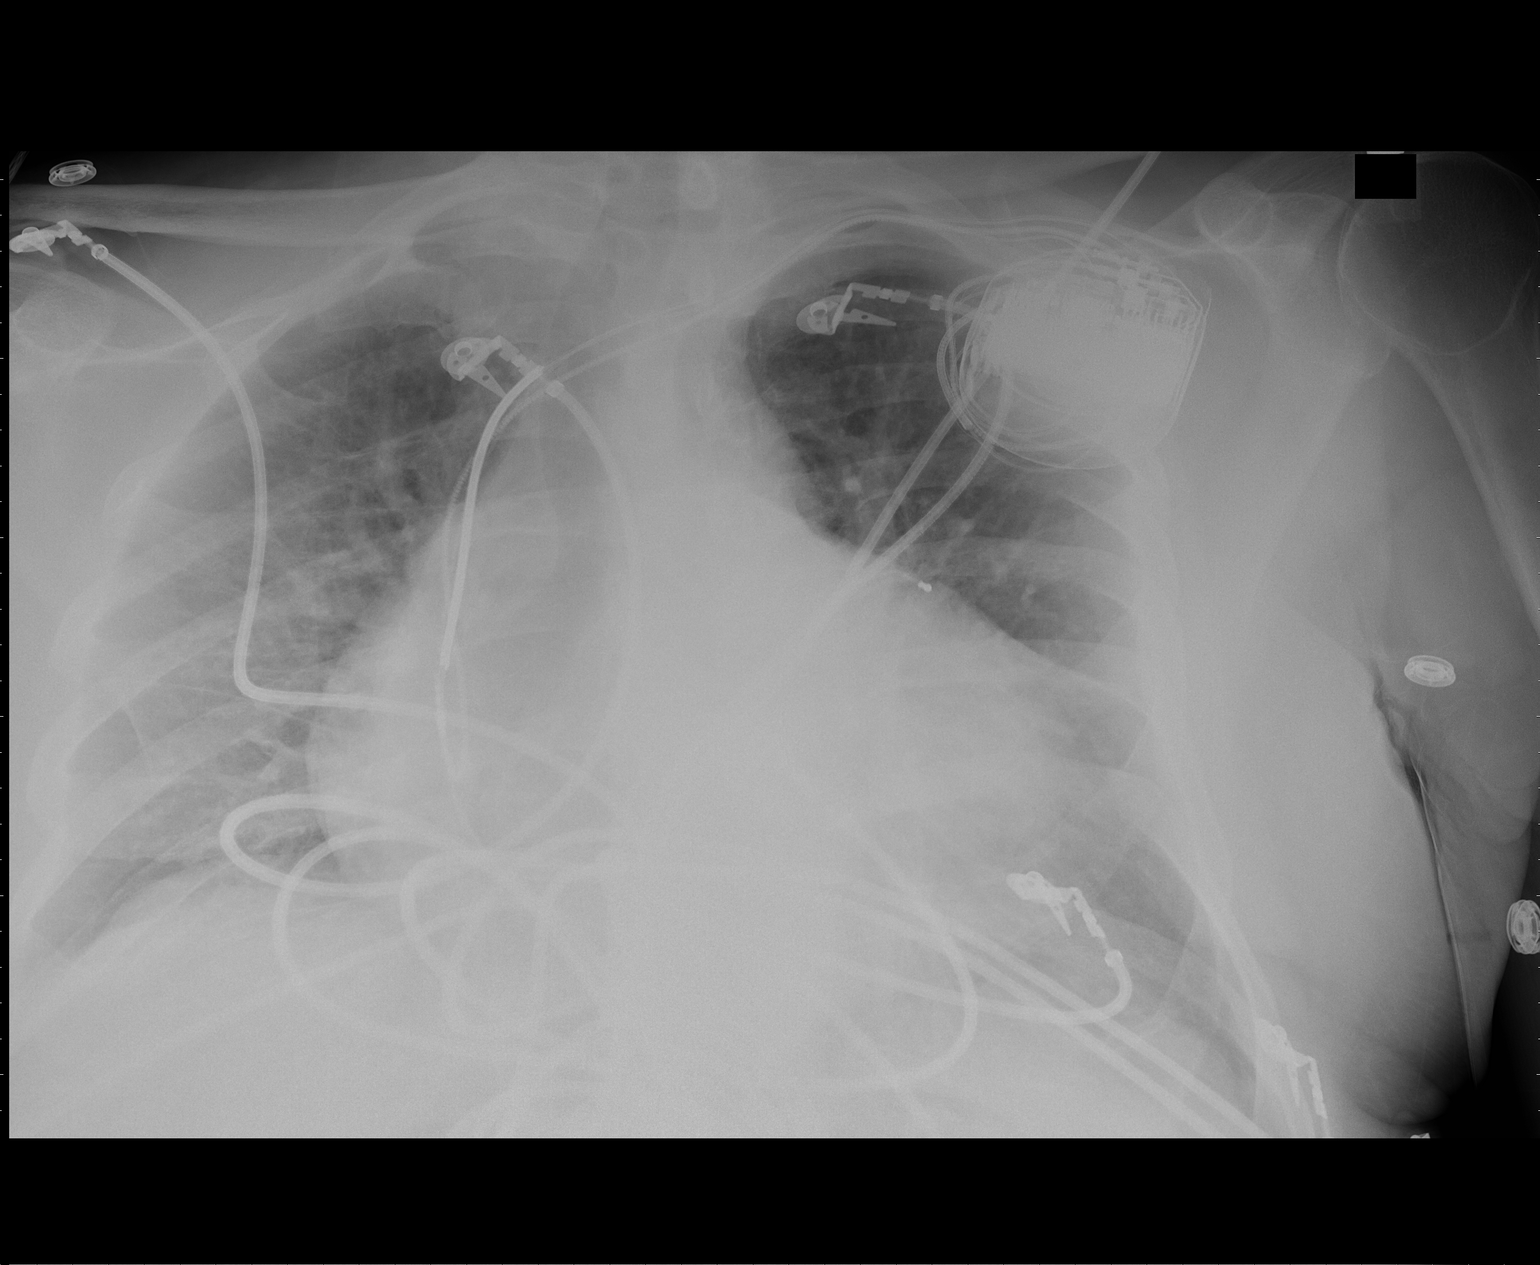

[1 of 1 positions shown; findings below may reference images not displayed]

FINDINGS: Poor inspiration.  Grossly stable enlargement of the
cardiac silhouette.  Stable left subclavian pacer and AICD leads.
Clear lungs.  Mildly prominent pulmonary vasculature.  No pleural
fluid seen.  Unremarkable bones.
IMPRESSION: Stable cardiomegaly with interval mild pulmonary vascular
congestion.

## 2010-07-16 ENCOUNTER — Telehealth: Payer: Self-pay | Admitting: Internal Medicine

## 2010-08-05 ENCOUNTER — Encounter: Payer: Self-pay | Admitting: Internal Medicine

## 2010-08-05 ENCOUNTER — Ambulatory Visit: Payer: Self-pay

## 2010-08-13 ENCOUNTER — Emergency Department (HOSPITAL_COMMUNITY): Admission: EM | Admit: 2010-08-13 | Discharge: 2010-08-13 | Payer: Self-pay | Admitting: Emergency Medicine

## 2010-09-09 ENCOUNTER — Telehealth: Payer: Self-pay | Admitting: Internal Medicine

## 2010-09-24 ENCOUNTER — Encounter: Payer: Self-pay | Admitting: Internal Medicine

## 2010-10-09 ENCOUNTER — Ambulatory Visit (HOSPITAL_COMMUNITY): Admission: RE | Admit: 2010-10-09 | Discharge: 2010-10-09 | Payer: Self-pay | Admitting: General Surgery

## 2010-10-09 ENCOUNTER — Encounter (HOSPITAL_BASED_OUTPATIENT_CLINIC_OR_DEPARTMENT_OTHER)
Admission: RE | Admit: 2010-10-09 | Discharge: 2010-12-29 | Payer: Self-pay | Source: Home / Self Care | Attending: General Surgery | Admitting: General Surgery

## 2010-10-29 ENCOUNTER — Telehealth: Payer: Self-pay | Admitting: Internal Medicine

## 2010-10-30 ENCOUNTER — Ambulatory Visit: Payer: Self-pay | Admitting: Vascular Surgery

## 2010-11-12 ENCOUNTER — Telehealth: Payer: Self-pay | Admitting: Internal Medicine

## 2010-11-18 ENCOUNTER — Ambulatory Visit: Payer: Self-pay | Admitting: Internal Medicine

## 2010-12-20 ENCOUNTER — Encounter: Payer: Self-pay | Admitting: Gastroenterology

## 2010-12-24 ENCOUNTER — Encounter: Payer: Self-pay | Admitting: Internal Medicine
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
## 2010-12-29 NOTE — Letter (Signed)
Summary: Ihor Gully MD  Ihor Gully MD   Imported By: Lester [place] 04/15/2010 09:10:01  _____________________________________________________________________  External Attachment:    Type:   Image     Comment:   External Document

## 2010-12-29 NOTE — Cardiovascular Report (Signed)
Summary: Office Visit   Office Visit   Imported By: Roderic Ovens 08/17/2010 15:41:19  _____________________________________________________________________  External Attachment:    Type:   Image     Comment:   External Document

## 2010-12-29 NOTE — Miscellaneous (Signed)
  Clinical Lists Changes  Observations: Added new observation of ECHOINTERP:  Study Conclusions    - Left ventricle: The cavity size was normal. Wall thickness was     normal. Systolic function was normal. The estimated ejection     fraction was in the range of 60% to 65%. Wall motion was normal;     there were no regional wall motion abnormalities. Left ventricular     diastolic function parameters were normal.   - Aortic valve: There was no stenosis.   - Mitral valve: Trivial regurgitation.   - Right ventricle: The cavity size was normal. Systolic function was     normal.   - Pulmonary arteries: PA systolic pressure 28-32 mmHg.   - Systemic veins: The IVC measured 1.9 cm with normal respirophasic     variation, suggesting RA pressure 6-10 mmHg.   Transthoracic echocardiography. M-mode, complete 2D, spectral   Doppler, and color Doppler. Height: Height: 167.6cm. Height: 66in.   Weight: Weight: 67.4kg. Weight: 148.2lb. Body mass index: BMI:   24kg/m^2. Body surface area: BSA: 1.15m^2. Blood pressure: 117/83.   Patient status: Inpatient. Location: ICU/CCU    --------------------------------------------------------------------  Prepared and Electronically Authenticated by    Marca Ancona, MD   2011-02-10T16:45:55.103 (01/08/2010 11:48)      Echocardiogram  Procedure date:  01/08/2010  Findings:       Study Conclusions    - Left ventricle: The cavity size was normal. Wall thickness was     normal. Systolic function was normal. The estimated ejection     fraction was in the range of 60% to 65%. Wall motion was normal;     there were no regional wall motion abnormalities. Left ventricular     diastolic function parameters were normal.   - Aortic valve: There was no stenosis.   - Mitral valve: Trivial regurgitation.   - Right ventricle: The cavity size was normal. Systolic function was     normal.   - Pulmonary arteries: PA systolic pressure 28-32 mmHg.   - Systemic  veins: The IVC measured 1.9 cm with normal respirophasic     variation, suggesting RA pressure 6-10 mmHg.   Transthoracic echocardiography. M-mode, complete 2D, spectral   Doppler, and color Doppler. Height: Height: 167.6cm. Height: 66in.   Weight: Weight: 67.4kg. Weight: 148.2lb. Body mass index: BMI:   24kg/m^2. Body surface area: BSA: 1.38m^2. Blood pressure: 117/83.   Patient status: Inpatient. Location: ICU/CCU    --------------------------------------------------------------------  Prepared and Electronically Authenticated by    Marca Ancona, MD   2011-02-10T16:45:55.103

## 2010-12-29 NOTE — Progress Notes (Signed)
Summary: pharmacy request  Phone Note From Pharmacy   Caller: [facility] -- Maxine Glenn Summary of Call: pharmacy called requesting previous eRx be re-sent because their system removed it in error. Initial call taken by: Margaret Pyle, CMA,  December 31, 2009 11:38 AM    Prescriptions: ACTOS 45 MG TABS (PIOGLITAZONE HCL) 1 by mouth once daily  #30 x 11   Entered by:   Margaret Pyle, CMA   Authorized by:   Corwin Levins MD   Signed by:   Margaret Pyle, CMA on 12/31/2009   Method used:   Electronically to        [facility] [phone]* (retail)       [address]       [place], [place]  [address]       Ph: [phone]       Fax: [phone]   RxID:   [id number] CEPHALEXIN 500 MG CAPS (CEPHALEXIN) 1po three times a day  #30 x 0   Entered by:   Margaret Pyle, CMA   Authorized by:   Corwin Levins MD   Signed by:   Margaret Pyle, CMA on 12/31/2009   Method used:   Electronically to        [facility] [phone]* (retail)       [address]       [place], [place]  [address]       Ph: [phone]       Fax: [phone]   RxID:   [id number] AVODART 0.5 MG CAPS (DUTASTERIDE) 1po once daily  #30 x 11   Entered by:   Margaret Pyle, CMA   Authorized by:   Corwin Levins MD   Signed by:   Margaret Pyle, CMA on 12/31/2009   Method used:   Electronically to        [facility] [phone]* (retail)       [address]       [place], [place]  [address]       Ph: [phone]       Fax: [phone]   RxID:   [id number] FREESTYLE LITE TEST  STRP (GLUCOSE BLOOD) use as directed  #100 x 11   Entered by:   Margaret Pyle, CMA   Authorized by:   Corwin Levins MD   Signed by:   Margaret Pyle, CMA on 12/31/2009   Method used:   Electronically to        [facility] [phone]* (retail)       [address]       [place], [place]  [address]       Ph: [phone]       Fax: [phone]  RxID:   [id number] JANUVIA 100 MG TABS (SITAGLIPTIN PHOSPHATE) 1po once daily  #30 x 11   Entered by:   Margaret Pyle, CMA   Authorized by:   Corwin Levins MD   Signed by:   Margaret Pyle, CMA on 12/31/2009   Method used:   Electronically to        [facility] [phone]* (retail)       [address]       [place], [place]  [address]       Ph: [phone]       Fax: [phone]   RxID:   [id number] PLAVIX 75 MG TABS (CLOPIDOGREL BISULFATE) Take 1 tablet by mouth once a day  #30 x 11   Entered by:  Margaret Pyle, CMA   Authorized by:   Corwin Levins MD   Signed by:   Margaret Pyle, CMA on 12/31/2009   Method used:   Electronically to        [facility] [phone]* (retail)       [address]       [place], [place]  [address]       Ph: [phone]       Fax: [phone]   RxID:   [id number] KLOR-CON M20 20 MEQ  TBCR (POTASSIUM CHLORIDE CRYS CR) 1 by mouth once daily  #30 x 11   Entered by:   Margaret Pyle, CMA   Authorized by:   Corwin Levins MD   Signed by:   Margaret Pyle, CMA on 12/31/2009   Method used:   Electronically to        [facility] [phone]* (retail)       [address]       [place], [place]  [address]       Ph: [phone]       Fax: [phone]   RxID:   [id number] LEVOTHROID 150 MCG  TABS (LEVOTHYROXINE SODIUM) 1 by mouth once daily  #30 x 11   Entered by:   Margaret Pyle, CMA   Authorized by:   Corwin Levins MD   Signed by:   Margaret Pyle, CMA on 12/31/2009   Method used:   Electronically to        [facility] [phone]* (retail)       [address]       [place], [place]  [address]       Ph: [phone]       Fax: [phone]   RxID:   [id number] AVAPRO 300 MG  TABS (IRBESARTAN) 1 by mouth once daily  #30 x 11   Entered by:   Margaret Pyle, CMA   Authorized by:   Corwin Levins MD   Signed by:   Margaret Pyle, CMA on  12/31/2009   Method used:   Electronically to        [facility] [phone]* (retail)       [address]       [place], [place]  [address]       Ph: [phone]       Fax: [phone]   RxID:   [id number] ISOSORBIDE DINITRATE 30 MG  TABS (ISOSORBIDE DINITRATE) 1 by mouth two times a day  #60 x 11   Entered by:   Margaret Pyle, CMA   Authorized by:   Corwin Levins MD   Signed by:   Margaret Pyle, CMA on 12/31/2009   Method used:   Electronically to        [facility] [phone]* (retail)       [address]       [place], [place]  [address]       Ph: [phone]       Fax: [phone]   RxID:   [id number] SIMVASTATIN 80 MG  TABS (SIMVASTATIN) 1 by mouth at bedtime  #30 x 11   Entered by:   Margaret Pyle, CMA   Authorized by:   Corwin Levins MD   Signed by:   Margaret Pyle, CMA on 12/31/2009   Method used:   Electronically to        [facility] [phone]* (retail)       [address]  [place], [place]  [address]       Ph: [phone]       Fax: [phone]   RxID:   [id number] OMEPRAZOLE 20 MG CPDR (OMEPRAZOLE) 1 by mouth once daily  #30 x 11   Entered by:   Margaret Pyle, CMA   Authorized by:   Corwin Levins MD   Signed by:   Margaret Pyle, CMA on 12/31/2009   Method used:   Electronically to        [facility] [phone]* (retail)       [address]       [place], [place]  [address]       Ph: [phone]       Fax: [phone]   RxID:   [id number] LASIX 40 MG TABS (FUROSEMIDE) 1 by mouth once daily  #30 x 11   Entered by:   Margaret Pyle, CMA   Authorized by:   Corwin Levins MD   Signed by:   Margaret Pyle, CMA on 12/31/2009   Method used:   Electronically to        [facility] [phone]* (retail)       [address]       [place], [place]  [address]       Ph: [phone]       Fax: [phone]   RxID:   [id number] GLUCOTROL XL 10 MG TB24  (GLIPIZIDE) 1 by mouth once daily  #30 x 11   Entered by:   Margaret Pyle, CMA   Authorized by:   Corwin Levins MD   Signed by:   Margaret Pyle, CMA on 12/31/2009   Method used:   Electronically to        [facility] [phone]* (retail)       [address]       [place], [place]  [address]       Ph: [phone]       Fax: [phone]   RxID:   [id number] COREG 25 MG TABS (CARVEDILOL) 1 by mouth two times a day  #60 x 11   Entered by:   Margaret Pyle, CMA   Authorized by:   Corwin Levins MD   Signed by:   Margaret Pyle, CMA on 12/31/2009   Method used:   Electronically to        [facility] [phone]* (retail)       [address]       [place], [place]  [address]       Ph: [phone]       Fax: [phone]   RxID:   [id number]

## 2010-12-29 NOTE — Progress Notes (Signed)
Summary: pt wants to be seen next week   Phone Note Call from Patient Call back at Home Phone [phone]   Caller: Patient Reason for Call: Talk to Nurse, Talk to Doctor Summary of Call: pt was seen in ER last Monday or Tuesday morning for burning and shocks coming from defib wants to see Dr. Ladona Ridgel asap per pt the ER Doc told him nothing was wrong and sent him home in a couple of hours but he still wants to be seen Initial call taken by: Omer Jack,  June 22, 2010 2:41 PM  Follow-up for Phone Call        Called patient...he states he was seen in the ER on 7/21for chest pain but he also thinks that his AICD fired and was not checked when he was there. Made appointment for AICD check on 7/27 at 1030 am with [facility] in the pacer/defib room. Follow-up by: Suzan Garibaldi RN

## 2010-12-29 NOTE — Progress Notes (Signed)
  Phone Note Refill Request Message from:  Fax from Pharmacy on October 29, 2010 11:14 AM  Refills Requested: Medication #1:  ISOSORBIDE DINITRATE 30 MG  TABS 1 by mouth two times a day   Dosage confirmed as above?Dosage Confirmed   Last Refilled: 12/31/2009   Notes: [facility] Initial call taken by: Zella Ball Ewing CMA Duncan Dull),  October 29, 2010 11:15 AM    Prescriptions: ISOSORBIDE DINITRATE 30 MG  TABS (ISOSORBIDE DINITRATE) 1 by mouth two times a day  #60 x 6   Entered by:   Scharlene Gloss CMA (AAMA)   Authorized by:   Corwin Levins MD   Signed by:   Scharlene Gloss CMA (AAMA) on 10/29/2010   Method used:   Faxed to ...       [facility] [address] [phone]* (retail)       [address]       [place], [place]  [address]       Ph: [phone]       Fax: [phone]   RxID:   [id number]
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
## 2010-12-29 NOTE — Progress Notes (Signed)
Summary: Pt request  Phone Note Call from Patient Call back at Home Phone [phone]   Caller: Patient Summary of Call: pt called request Rx for wheel chair repair to [facility] (phone [phone]).  Initial call taken by: Margaret Pyle, CMA,  December 19, 2009 11:50 AM
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
## 2010-12-29 NOTE — Progress Notes (Signed)
Summary: HHRN?  Phone Note Other Incoming   Caller[facility]inton Rump [facility] [phone] Summary of Call: HHRN called to inform MD that pt has +2 edema LT leg and his abd is distended. RN would like to know if MD would like BMP and BNP done. please advise Initial call taken by: Margaret Pyle, CMA,  January 28, 2010 1:59 PM  Follow-up for Phone Call        ok  - for 278.1 Follow-up by: Corwin Levins MD,  January 28, 2010 2:30 PM  Additional Follow-up for Phone Call Additional follow up Details #1::        Amy infomed via VM, told to call back with any further questons or concerns Additional Follow-up by: Margaret Pyle, CMA,  January 29, 2010 8:07 AM

## 2010-12-29 NOTE — Progress Notes (Signed)
Summary: Rx change  Phone Note Call from Patient Call back at Home Phone [phone]   Caller: Patient Call For: Dr [name] Summary of Call: Pt will be out medications tomorrow, pt is panicking as [facility] is telling him he cannot fill so soon but he will be out tomorrow. Pt does not know what medications.Pt wants a call, pt is panicking. Please advise. Initial call taken by: Verdell Face,  December 31, 2009 11:13 AM  Follow-up for Phone Call        pt called to request that his Rx go to [address]. rx's were sent to [facility] Follow-up by: Margaret Pyle, CMA,  December 31, 2009 11:22 AM    Prescriptions: ACTOS 45 MG TABS (PIOGLITAZONE HCL) 1 by mouth once daily  #30 x 11   Entered by:   Margaret Pyle, CMA   Authorized by:   Corwin Levins MD   Signed by:   Margaret Pyle, CMA on 12/31/2009   Method used:   Electronically to        [facility] [phone]* (retail)       [address]       [place], [place]  [address]       Ph: [phone]       Fax: [phone]   RxID:   [id number] CEPHALEXIN 500 MG CAPS (CEPHALEXIN) 1po three times a day  #30 x 0   Entered by:   Margaret Pyle, CMA   Authorized by:   Corwin Levins MD   Signed by:   Margaret Pyle, CMA on 12/31/2009   Method used:   Electronically to        [facility] [phone]* (retail)       [address]       [place], [place]  [address]       Ph: [phone]       Fax: [phone]   RxID:   [id number] AVODART 0.5 MG CAPS (DUTASTERIDE) 1po once daily  #30 x 11   Entered by:   Margaret Pyle, CMA   Authorized by:   Corwin Levins MD   Signed by:   Margaret Pyle, CMA on 12/31/2009   Method used:   Electronically to        [facility] [phone]* (retail)       [address]       [place], [place]  [address]       Ph: [phone]       Fax: [phone]   RxID:   [id number] FREESTYLE LITE TEST  STRP (GLUCOSE  BLOOD) use as directed  #100 x 11   Entered by:   Margaret Pyle, CMA   Authorized by:   Corwin Levins MD   Signed by:   Margaret Pyle, CMA on 12/31/2009   Method used:   Electronically to        [facility] [phone]* (retail)       [address]       [place], [place]  [address]       Ph: [phone]       Fax: [phone]   RxID:   [id number] JANUVIA 100 MG TABS (SITAGLIPTIN PHOSPHATE) 1po once daily  #30 x 11   Entered by:   Margaret Pyle, CMA   Authorized by:   Corwin Levins MD   Signed by:   Margaret Pyle, CMA on 12/31/2009   Method used:  Electronically to        [facility] [phone]* (retail)       [address]       [place], [place]  [address]       Ph: [phone]       Fax: [phone]   RxID:   [id number] PLAVIX 75 MG TABS (CLOPIDOGREL BISULFATE) Take 1 tablet by mouth once a day  #30 x 11   Entered by:   Margaret Pyle, CMA   Authorized by:   Corwin Levins MD   Signed by:   Margaret Pyle, CMA on 12/31/2009   Method used:   Electronically to        [facility] [phone]* (retail)       [address]       [place], [place]  [address]       Ph: [phone]       Fax: [phone]   RxID:   [id number] KLOR-CON M20 20 MEQ  TBCR (POTASSIUM CHLORIDE CRYS CR) 1 by mouth once daily  #30 x 11   Entered by:   Margaret Pyle, CMA   Authorized by:   Corwin Levins MD   Signed by:   Margaret Pyle, CMA on 12/31/2009   Method used:   Electronically to        [facility] [phone]* (retail)       [address]       [place], [place]  [address]       Ph: [phone]       Fax: [phone]   RxID:   [id number] LEVOTHROID 150 MCG  TABS (LEVOTHYROXINE SODIUM) 1 by mouth once daily  #30 x 11   Entered by:   Margaret Pyle, CMA   Authorized by:   Corwin Levins MD   Signed by:   Margaret Pyle, CMA on 12/31/2009   Method used:   Electronically to         [facility] [phone]* (retail)       [address]       [place], [place]  [address]       Ph: [phone]       Fax: [phone]   RxID:   [id number] AVAPRO 300 MG  TABS (IRBESARTAN) 1 by mouth once daily  #30 x 11   Entered by:   Margaret Pyle, CMA   Authorized by:   Corwin Levins MD   Signed by:   Margaret Pyle, CMA on 12/31/2009   Method used:   Electronically to        [facility] [phone]* (retail)       [address]       [place], [place]  [address]       Ph: [phone]       Fax: [phone]   RxID:   [id number] ISOSORBIDE DINITRATE 30 MG  TABS (ISOSORBIDE DINITRATE) 1 by mouth two times a day  #60 x 11   Entered by:   Margaret Pyle, CMA   Authorized by:   Corwin Levins MD   Signed by:   Margaret Pyle, CMA on 12/31/2009   Method used:   Electronically to        [facility] [phone]* (retail)       [address]       [place], [place]  [address]       Ph: [phone]       Fax: [phone]  RxID:   [id number] SIMVASTATIN 80 MG  TABS (SIMVASTATIN) 1 by mouth at bedtime  #30 x 11   Entered by:   Margaret Pyle, CMA   Authorized by:   Corwin Levins MD   Signed by:   Margaret Pyle, CMA on 12/31/2009   Method used:   Electronically to        [facility] [phone]* (retail)       [address]       [place], [place]  [address]       Ph: [phone]       Fax: [phone]   RxID:   [id number] OMEPRAZOLE 20 MG CPDR (OMEPRAZOLE) 1 by mouth once daily  #30 x 11   Entered by:   Margaret Pyle, CMA   Authorized by:   Corwin Levins MD   Signed by:   Margaret Pyle, CMA on 12/31/2009   Method used:   Electronically to        [facility] [phone]* (retail)       [address]       [place], [place]  [address]       Ph: [phone]       Fax: [phone]   RxID:   [id number] LASIX 40 MG TABS (FUROSEMIDE) 1 by mouth once daily  #30 x 11   Entered by:    Margaret Pyle, CMA   Authorized by:   Corwin Levins MD   Signed by:   Margaret Pyle, CMA on 12/31/2009   Method used:   Electronically to        [facility] [phone]* (retail)       [address]       [place], [place]  [address]       Ph: [phone]       Fax: [phone]   RxID:   [id number] GLUCOTROL XL 10 MG TB24 (GLIPIZIDE) 1 by mouth once daily  #30 x 11   Entered by:   Margaret Pyle, CMA   Authorized by:   Corwin Levins MD   Signed by:   Margaret Pyle, CMA on 12/31/2009   Method used:   Electronically to        [facility] [phone]* (retail)       [address]       [place], [place]  [address]       Ph: [phone]       Fax: [phone]   RxID:   [id number] COREG 25 MG TABS (CARVEDILOL) 1 by mouth two times a day  #60 x 11   Entered by:   Margaret Pyle, CMA   Authorized by:   Corwin Levins MD   Signed by:   Margaret Pyle, CMA on 12/31/2009   Method used:   Electronically to        [facility] [phone]* (retail)       [address]       [place], [place]  [address]       Ph: [phone]       Fax: [phone]   RxID:   [id number]

## 2010-12-29 NOTE — Assessment & Plan Note (Signed)
Summary: POST HOSP/NWS   Vital Signs:  Patient profile:   77 year old male Height:      74 inches Weight:      283 pounds BMI:     36.47 O2 Sat:      98 % on Room air Temp:     98.2 degrees F oral Pulse rate:   67 / minute BP sitting:   120 / 72  (left arm) Cuff size:   large  Vitals Entered ByMarland Kitchen Zella Ball Ewing (January 23, 2010 2:58 PM)  O2 Flow:  Room air CC: Post Hosp., refills/RE   Primary Care Provider:  Oliver Barre MD  CC:  Post Hosp. and refills/RE.  History of Present Illness: here post hospn with CAP, d/c'd feb 21 - overall doing well with good med compliance post hospn - finished the avelox and no significant fever, cough, ST, sob and Pt denies CP, sob, doe, wheezing, orthopnea, pnd, worsening LE edema, palps, dizziness or syncope . Pt denies new neuro symptoms such as headache, facial or extremity weakness   Pt denies polydipsia, polyuria, or low sugar symptoms such as shakiness improved with eating.  Overall good compliance with meds, trying to follow low chol, DM diet, wt stable, little excercise however Also had urine cx positive and denies UTI symptoms of pain, freq, urgency.    Current Medications (verified): 1)  Coreg 25 Mg Tabs (Carvedilol) .Marland Kitchen.. 1 By Mouth Two Times A Day 2)  Glucotrol Xl 10 Mg Tb24 (Glipizide) .Marland Kitchen.. 1 By Mouth Once Daily 3)  Lasix 40 Mg Tabs (Furosemide) .Marland Kitchen.. 1 By Mouth Once Daily 4)  Omeprazole 20 Mg Cpdr (Omeprazole) .Marland Kitchen.. 1 By Mouth Once Daily 5)  Simvastatin 80 Mg  Tabs (Simvastatin) .Marland Kitchen.. 1 By Mouth At Bedtime 6)  Isosorbide Dinitrate 30 Mg  Tabs (Isosorbide Dinitrate) .Marland Kitchen.. 1 By Mouth Two Times A Day 7)  Avapro 300 Mg  Tabs (Irbesartan) .Marland Kitchen.. 1 By Mouth Once Daily 8)  Levothroid 150 Mcg  Tabs (Levothyroxine Sodium) .Marland Kitchen.. 1 By Mouth Once Daily 9)  Klor-Con M20 20 Meq  Tbcr (Potassium Chloride Crys Cr) .Marland Kitchen.. 1 By Mouth Once Daily 10)  Plavix 75 Mg Tabs (Clopidogrel Bisulfate) .... Take 1 Tablet By Mouth Once A Day 11)  Januvia 100 Mg Tabs  (Sitagliptin Phosphate) .Marland Kitchen.. 1po Once Daily 12)  Freestyle Lite Test  Strp (Glucose Blood) .... Use As Directed 13)  Avodart 0.5 Mg Caps (Dutasteride) .Marland Kitchen.. 1po Once Daily 14)  Cephalexin 500 Mg Caps (Cephalexin) .Marland Kitchen.. 1po Three Times A Day 15)  Actos 45 Mg Tabs (Pioglitazone Hcl) .Marland Kitchen.. 1 By Mouth Once Daily 16)  12v 33ah J Term Dc Agm [id number] Battery .... Use Asd 17)  Repair of Power Wheelchair .... As Directed  Allergies (verified): 1)  ! Percocet 2)  ! Percodan   Impression & Recommendations:  Problem # 1:  PNEUMONIA, COMMUNITY ACQUIRED, PNEUMOCOCCAL (ICD-481)  The following medications were removed from the medication list:    Cephalexin 500 Mg Caps (Cephalexin) .Marland Kitchen... 1po three times a day  Orders: T-2 View CXR, Same Day (71020.5TC) doing well, will check cxr as per d/c summary recommendation;  no further meds or other w/u needed at this time; f/u any worsening s/s  Problem # 2:  UTI (ICD-599.0)  The following medications were removed from the medication list:    Cephalexin 500 Mg Caps (Cephalexin) .Marland Kitchen... 1po three times a day resolved, f/u any worsening s/s  Problem # 3:  HYPERTENSION (ICD-401.9)  His updated  medication list for this problem includes:    Coreg 25 Mg Tabs (Carvedilol) .Marland Kitchen... 1 by mouth two times a day    Lasix 40 Mg Tabs (Furosemide) .Marland Kitchen... 1 by mouth once daily    Avapro 300 Mg Tabs (Irbesartan) .Marland Kitchen... 1 by mouth once daily  BP today: 120/72 Prior BP: 118/60 (01/01/2010)  Labs Reviewed: K+: 4.3 (12/02/2009) Creat: : 2.2 (12/02/2009)   Chol: 114 (12/02/2009)   HDL: 37.10 (12/02/2009)   LDL: 61 (12/02/2009)   TG: 78.0 (12/02/2009) stable overall by hx and exam, ok to continue meds/tx as is   Problem # 4:  DIABETES MELLITUS, TYPE II (ICD-250.00)  His updated medication list for this problem includes:    Glucotrol Xl 10 Mg Tb24 (Glipizide) .Marland Kitchen... 1 by mouth once daily    Avapro 300 Mg Tabs (Irbesartan) .Marland Kitchen... 1 by mouth once daily    Januvia 100 Mg Tabs  (Sitagliptin phosphate) .Marland Kitchen... 1po once daily    Actos 45 Mg Tabs (Pioglitazone hcl) .Marland Kitchen... 1 by mouth once daily meds recently adjusted - Continue all previous medications as before this visit , f/u labs next visti  Complete Medication List: 1)  Coreg 25 Mg Tabs (Carvedilol) .Marland Kitchen.. 1 by mouth two times a day 2)  Glucotrol Xl 10 Mg Tb24 (Glipizide) .Marland Kitchen.. 1 by mouth once daily 3)  Lasix 40 Mg Tabs (Furosemide) .Marland Kitchen.. 1 by mouth once daily 4)  Omeprazole 20 Mg Cpdr (Omeprazole) .Marland Kitchen.. 1 by mouth once daily 5)  Simvastatin 80 Mg Tabs (Simvastatin) .Marland Kitchen.. 1 by mouth at bedtime 6)  Isosorbide Dinitrate 30 Mg Tabs (Isosorbide dinitrate) .Marland Kitchen.. 1 by mouth two times a day 7)  Avapro 300 Mg Tabs (Irbesartan) .Marland Kitchen.. 1 by mouth once daily 8)  Levothroid 150 Mcg Tabs (Levothyroxine sodium) .Marland Kitchen.. 1 by mouth once daily 9)  Klor-con M20 20 Meq Tbcr (Potassium chloride crys cr) .Marland Kitchen.. 1 by mouth once daily 10)  Plavix 75 Mg Tabs (Clopidogrel bisulfate) .... Take 1 tablet by mouth once a day 11)  Januvia 100 Mg Tabs (Sitagliptin phosphate) .Marland Kitchen.. 1po once daily 12)  Freestyle Lite Test Strp (Glucose blood) .... Use as directed 13)  Avodart 0.5 Mg Caps (Dutasteride) .Marland Kitchen.. 1po once daily 14)  Actos 45 Mg Tabs (Pioglitazone hcl) .Marland Kitchen.. 1 by mouth once daily 15)  12v 33ah J Term Dc Agm [id number] Battery  .... Use asd 16)  Repair of Power Wheelchair  .... As directed  Patient Instructions: 1)  Please go to Radiology in the basement level for your X-Ray today  2)  Continue all previous medications as before this visit  3)  Please schedule a follow-up appointment in 2 months. Prescriptions: KLOR-CON M20 20 MEQ  TBCR (POTASSIUM CHLORIDE CRYS CR) 1 by mouth once daily  #30 x 11   Entered and Authorized by:   Corwin Levins MD   Signed by:   Corwin Levins MD on 01/23/2010   Method used:   Electronically to        [facility] [phone]* (retail)       [address]       [place], [place]  [address]       Ph: [phone]        Fax: [phone]   RxID:   [id number]

## 2010-12-29 NOTE — Miscellaneous (Signed)
Summary: Physician Order/[facility]  Physician Order/[facility]   Imported By: Sherian Rein 02/04/2010 14:51:15  _____________________________________________________________________  External Attachment:    Type:   Image     Comment:   External Document

## 2010-12-29 NOTE — Progress Notes (Signed)
Summary: Wheelchair repair orders  Phone Note Call from Patient Call back at [facility] Phone [phone]   Caller: Patient Summary of Call: pt called requesting MD's nurse called [facility] at [phone] to okay work order for repairs to his wheelchair.  Initial call taken by: Margaret Pyle, CMA,  December 22, 2009 9:08 AM  Follow-up for Phone Call        ok for verbal Follow-up by: Corwin Levins MD,  December 22, 2009 12:56 PM  Additional Follow-up for Phone Call Additional follow up Details #1::        Left message on voicemail to call back to office. Additional Follow-up by: Lucious Groves,  December 22, 2009 3:22 PM    Additional Follow-up for Phone Call Additional follow up Details #2::    spoke with [facility]. [facility] stated that an Rx for "Repair of power wheelchair" will need to be faxed to [phone]. Follow-up by: Margaret Pyle, CMA,  December 23, 2009 10:27 AM  Additional Follow-up for Phone Call Additional follow up Details #3:: Details for Additional Follow-up Action Taken: done hardcopy to [facility] side B - dahlia  Additional Follow-up by: Corwin Levins MD,  December 23, 2009 12:33 PM  New/Updated Medications: * REPAIR OF POWER WHEELCHAIR as directed Prescriptions: REPAIR OF POWER WHEELCHAIR as directed  #1 x 1   Entered and Authorized by:   Corwin Levins MD   Signed by:   Corwin Levins MD on 12/23/2009   Method used:   Print then Give to Patient   RxID:   [id number]  rx faxed to [facility]. Margaret Pyle, CMA  December 23, 2009 12:44 PM

## 2010-12-29 NOTE — Procedures (Signed)
Summary: possible AICD discharge/jr      Allergies Added:   Current Medications (verified): 1)  Coreg 25 Mg Tabs (Carvedilol) .Marland Kitchen.. 1 By Mouth Two Times A Day 2)  Glucotrol Xl 10 Mg Tb24 (Glipizide) .Marland Kitchen.. 1 By Mouth Once Daily 3)  Lasix 40 Mg Tabs (Furosemide) .Marland Kitchen.. 1 By Mouth Once Daily 4)  Omeprazole 20 Mg Cpdr (Omeprazole) .Marland Kitchen.. 1 By Mouth Once Daily 5)  Simvastatin 80 Mg  Tabs (Simvastatin) .Marland Kitchen.. 1 By Mouth At Bedtime 6)  Isosorbide Dinitrate 30 Mg  Tabs (Isosorbide Dinitrate) .Marland Kitchen.. 1 By Mouth Two Times A Day 7)  Avapro 300 Mg  Tabs (Irbesartan) .Marland Kitchen.. 1 By Mouth Once Daily 8)  Levothroid 150 Mcg  Tabs (Levothyroxine Sodium) .Marland Kitchen.. 1 By Mouth Once Daily 9)  Klor-Con M20 20 Meq  Tbcr (Potassium Chloride Crys Cr) .Marland Kitchen.. 1 By Mouth Once Daily 10)  Plavix 75 Mg Tabs (Clopidogrel Bisulfate) .... Take 1 Tablet By Mouth Once A Day 11)  Freestyle Lite Test  Strp (Glucose Blood) .... Use As Directed 12)  12v 33ah J Term Dc Agm [id number] Battery .... Use Asd 13)  Repair of Power Wheelchair .... As Directed 14)  Lantus 100 Unit/ml Soln (Insulin Glargine) .... Uad - 100 Units Subcutaneously Once Daily 15)  Nitrofurantoin Macrocrystal 50 Mg Caps (Nitrofurantoin Macrocrystal) .Marland Kitchen.. 1 By Mouth Once Daily 16)  Avodart 0.5 Mg Caps (Dutasteride) .Marland Kitchen.. 1po Once Daily  Allergies (verified): 1)  ! Percocet 2)  ! Percodan   ICD Specifications Following MD:  [name] Bunting, MD     ICD Vendor:  [employer]tronic     ICD Model Number:  [phone]     ICD Serial Number:  [id number] ICD DOI:  05/25/2006     ICD Implanting MD:  EDMONDS,JOHN  Lead 1:    Location: RA     DOI: 05/25/2001     Model #: 4034     Serial #: [id number]     Status: active Lead 2:    Location: RV     DOI: 05/25/2006     Model #: 5643     Serial #: [id number]     Status: active Lead 3:    Location: LV     DOI: 05/25/2006     Model #: 6606     Serial #: [id number]     Status: active  Indications::  CHF, LV DYSFUNCTION   ICD Follow Up Battery Voltage:   2.82 V     Charge Time:  11.8 seconds     Underlying rhythm:  SR ICD Dependent:  No       ICD Device Measurements Atrium:  Amplitude: 2.8 mV, Impedance: 576 ohms, Threshold: 1.0 V at 0.4 msec Right Ventricle:  Amplitude: 13.6 mV, Impedance: 472 ohms, Threshold: 0.5 V at 0.4 msec Left Ventricle:  Impedance: 736 ohms, Threshold: 2.0 V at 0.8 msec Configuration: LV TIP TO RV COIL Shock Impedance: 47/62 ohms   Episodes MS Episodes:  0     Coumadin:  No Shock:  0     ATP:  0     Nonsustained:  0     Atrial Therapies:  0 Atrial Pacing:  1.4%     Ventricular Pacing:  99.6%  Brady Parameters Mode DDD     Lower Rate Limit:  50     Upper Rate Limit 130 PAV 180     Sensed AV Delay:  140  Tachy Zones VF:  188     VT:  171  Next Cardiology Appt Due:  08/31/2010 Tech Comments:  PT THOUGHT ICD DISCHARGED.  NO EPISODES SINCE LAST CHECK.  NORMAL DEVICE FUNCTION.  TURNED ON 1:1 SVT.  CHANGED LV AMPLITUDE FROM 3.5 TO 3.0 V.  CHANGED MAX LEAD IMPEDANCES FOR LIA.  ROV IN 3 MTHS.  [name]  June 24, 2010 1:41 PM
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
## 2010-12-29 NOTE — Procedures (Signed)
Summary: device/saf      Allergies Added:   Current Medications (verified): 1)  Coreg 25 Mg Tabs (Carvedilol) .Marland Kitchen.. 1 By Mouth Two Times A Day 2)  Glucotrol Xl 10 Mg Tb24 (Glipizide) .Marland Kitchen.. 1 By Mouth Once Daily 3)  Lasix 40 Mg Tabs (Furosemide) .Marland Kitchen.. 1 By Mouth Once Daily 4)  Omeprazole 20 Mg Cpdr (Omeprazole) .Marland Kitchen.. 1 By Mouth Once Daily 5)  Simvastatin 80 Mg  Tabs (Simvastatin) .Marland Kitchen.. 1 By Mouth At Bedtime 6)  Isosorbide Dinitrate 30 Mg  Tabs (Isosorbide Dinitrate) .Marland Kitchen.. 1 By Mouth Two Times A Day 7)  Avapro 300 Mg  Tabs (Irbesartan) .Marland Kitchen.. 1 By Mouth Once Daily 8)  Levothroid 150 Mcg  Tabs (Levothyroxine Sodium) .Marland Kitchen.. 1 By Mouth Once Daily 9)  Klor-Con M20 20 Meq  Tbcr (Potassium Chloride Crys Cr) .Marland Kitchen.. 1 By Mouth Once Daily 10)  Plavix 75 Mg Tabs (Clopidogrel Bisulfate) .... Take 1 Tablet By Mouth Once A Day 11)  Freestyle Lite Test  Strp (Glucose Blood) .... Use As Directed 12)  12v 33ah J Term Dc Agm [id number] Battery .... Use Asd 13)  Repair of Power Wheelchair .... As Directed 14)  Lantus 100 Unit/ml Soln (Insulin Glargine) .... Uad - 100 Units Subcutaneously Once Daily 15)  Nitrofurantoin Macrocrystal 50 Mg Caps (Nitrofurantoin Macrocrystal) .Marland Kitchen.. 1 By Mouth Once Daily 16)  Avodart 0.5 Mg Caps (Dutasteride) .Marland Kitchen.. 1po Once Daily  Allergies (verified): 1)  ! Percocet 2)  ! Percodan   ICD Specifications Following MD:  Lewayne Bunting, MD     ICD Vendor:  [employer]     ICD Model Number:  [phone]     ICD Serial Number:  [id number] ICD DOI:  05/25/2006     ICD Implanting MD:  EDMONDS,JOHN  Lead 1:    Location: RA     DOI: 05/25/2001     Model #: 7846     Serial #: [id number]     Status: active Lead 2:    Location: RV     DOI: 05/25/2006     Model #: 2440     Serial #: [id number]     Status: active Lead 3:    Location: LV     DOI: 05/25/2006     Model #: 4403     Serial #: [id number]     Status: active  Indications::  CHF, LV DYSFUNCTION   ICD Follow Up Remote Check?  No Battery Voltage:   0289 V     Charge Time:  11.2 seconds     Underlying rhythm:  SR ICD Dependent:  No       ICD Device Measurements Atrium:  Amplitude: 2.3 mV, Impedance: 552 ohms, Threshold: 0.5 V at 0.4 msec Right Ventricle:  Amplitude: 13.1 mV, Impedance: 472 ohms, Threshold: 0.5 V at 0.4 msec Left Ventricle:  Impedance: 648 ohms, Threshold: 2.0 V at 0.8 msec Configuration: LV TIP TO RV COIL Shock Impedance: 45/60 ohms   Episodes MS Episodes:  1     Percent Mode Switch:  0     Coumadin:  No Shock:  0     ATP:  0     Nonsustained:  3      Brady Parameters Mode DDD     Lower Rate Limit:  50     Upper Rate Limit 130 PAV 180     Sensed AV Delay:  140  Tachy Zones VF:  188     VT:  171     Next Cardiology  Appt Due:  07/30/2010 Tech Comments:  No parameter changes.  Device function normal.  Optivol and thoracic impedance normal. A-fib 42 seconds, - coumadin. 6949 lead stable, SIC 0.  Updated letter and magnet given to patient along with alert tones demonstrated.   ROV 3 months clinic. Altha Harm, LPN  May 04, 346 2:41 PM

## 2010-12-29 NOTE — Cardiovascular Report (Signed)
Summary: Office Visit   Office Visit   Imported By: Roderic Ovens 05/23/2010 12:02:54  _____________________________________________________________________  External Attachment:    Type:   Image     Comment:   External Document
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
## 2010-12-29 NOTE — Letter (Signed)
Summary: Power Wheelchair DENIED/[employer]  Power Wheelchair DENIED/[employer]   Imported By: Sherian Rein 01/22/2010 09:48:44  _____________________________________________________________________  External Attachment:    Type:   Image     Comment:   External Document
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
## 2010-12-29 NOTE — Progress Notes (Signed)
Summary: O2 req  Phone Note Call from Patient Call back at Home Phone [phone]   Caller: Patient Summary of Call: Pt called requesting referral to have continuous O2 in home. Initial call taken by: Margaret Pyle, CMA,  September 09, 2010 2:29 PM  Follow-up for Phone Call        oxygen was ok at his last visit in may 2011  has he had low oxygen proven by an o2 sat monitor since then? Follow-up by: Corwin Levins MD,  September 09, 2010 2:36 PM  Additional Follow-up for Phone Call Additional follow up Details #1::        I called pt back and attempted to ask for sxs, pt stated he has SOB sometimes, he refused to explain when asked follow up questions. Pt then asked for the date of next appt with JWJ. I advised that he did not have an appt scheduled at this time but he could scheduled if he preferred to explain to MD. Pt said he does not need an appt at this time but he will call back if needed. Additional Follow-up by: Margaret Pyle, CMA,  September 09, 2010 3:42 PM

## 2010-12-29 NOTE — Medication Information (Signed)
Summary: Repair of Power Psychologist, clinical of Power Wheelchair/[employer]   Imported By: Sherian Rein 12/31/2009 09:20:23  _____________________________________________________________________  External Attachment:    Type:   Image     Comment:   External Document

## 2010-12-29 NOTE — Letter (Signed)
Summary: CMN for Below knee prosthesis socks/[facility]  CMN for Below knee prosthesis socks/[facility]   Imported By: Sherian Rein 01/15/2010 11:43:18  _____________________________________________________________________  External Attachment:    Type:   Image     Comment:   External Document

## 2010-12-29 NOTE — Cardiovascular Report (Signed)
Summary: Office Visit   Office Visit   Imported By: Roderic Ovens 12/04/2009 14:25:24  _____________________________________________________________________  External Attachment:    Type:   Image     Comment:   External Document

## 2010-12-29 NOTE — Cardiovascular Report (Signed)
Summary: Office Visit   Office Visit   Imported By: Roderic Ovens 06/30/2010 15:25:04  _____________________________________________________________________  External Attachment:    Type:   Image     Comment:   External Document

## 2010-12-29 NOTE — Procedures (Signed)
Summary: device/saf      Allergies Added:   Current Medications (verified): 1)  Coreg 25 Mg Tabs (Carvedilol) .Marland Kitchen.. 1 By Mouth Two Times A Day 2)  Glucotrol Xl 10 Mg Tb24 (Glipizide) .Marland Kitchen.. 1 By Mouth Once Daily 3)  Lasix 40 Mg Tabs (Furosemide) .Marland Kitchen.. 1 By Mouth Once Daily 4)  Omeprazole 20 Mg Cpdr (Omeprazole) .Marland Kitchen.. 1 By Mouth Once Daily 5)  Simvastatin 80 Mg  Tabs (Simvastatin) .Marland Kitchen.. 1 By Mouth At Bedtime 6)  Isosorbide Dinitrate 30 Mg  Tabs (Isosorbide Dinitrate) .Marland Kitchen.. 1 By Mouth Two Times A Day 7)  Avapro 300 Mg  Tabs (Irbesartan) .Marland Kitchen.. 1 By Mouth Once Daily 8)  Levothroid 150 Mcg  Tabs (Levothyroxine Sodium) .Marland Kitchen.. 1 By Mouth Once Daily 9)  Klor-Con M20 20 Meq  Tbcr (Potassium Chloride Crys Cr) .Marland Kitchen.. 1 By Mouth Once Daily 10)  Plavix 75 Mg Tabs (Clopidogrel Bisulfate) .... Take 1 Tablet By Mouth Once A Day 11)  Freestyle Lite Test  Strp (Glucose Blood) .... Use As Directed 12)  12v 33ah J Term Dc Agm [id number] Battery .... Use Asd 13)  Repair of Power Wheelchair .... As Directed 14)  Lantus 100 Unit/ml Soln (Insulin Glargine) .... Uad - 100 Units Subcutaneously Once Daily 15)  Nitrofurantoin Macrocrystal 50 Mg Caps (Nitrofurantoin Macrocrystal) .Marland Kitchen.. 1 By Mouth Once Daily 16)  Avodart 0.5 Mg Caps (Dutasteride) .Marland Kitchen.. 1po Once Daily  Allergies (verified): 1)  ! Percocet 2)  ! Percodan   ICD Specifications Following MD:  Lewayne Bunting, MD     ICD Vendor:  [employer]     ICD Model Number:  [phone]     ICD Serial Number:  [id number] ICD DOI:  05/25/2006     ICD Implanting MD:  EDMONDS,JOHN  Lead 1:    Location: RA     DOI: 05/25/2001     Model #: 7846     Serial #: [id number]     Status: active Lead 2:    Location: RV     DOI: 05/25/2006     Model #: 2440     Serial #: [id number]     Status: active Lead 3:    Location: LV     DOI: 05/25/2006     Model #: [id number]     Serial #: [id number]     Status: active  Indications::  CHF, LV DYSFUNCTION   ICD Follow Up Battery Voltage:  2.78 V     Charge  Time:  11.8 seconds     Underlying rhythm:  SR ICD Dependent:  No       ICD Device Measurements Atrium:  Amplitude: 2.4 mV, Impedance: 576 ohms, Threshold: 1.0 V at 0.4 msec Right Ventricle:  Amplitude: 13.1 mV, Impedance: 488 ohms, Threshold: 0.5 V at 0.4 msec Left Ventricle:  Impedance: 672 ohms, Threshold: 2.0 V at 0.8 msec Configuration: LV TIP TO RV COIL Shock Impedance: 44/60 ohms   Episodes MS Episodes:  1     Percent Mode Switch:  <0.1%     Coumadin:  No Shock:  0     ATP:  0     Nonsustained:  2     Atrial Pacing:  2.3%     Ventricular Pacing:  99.2%  Brady Parameters Mode DDD     Lower Rate Limit:  50     Upper Rate Limit 130 PAV 180     Sensed AV Delay:  140  Tachy Zones VF:  188     VT:  171     Next Cardiology Appt Due:  10/29/2010 Tech Comments:  INCREASE IN OPTIVOL FROM 08-02-10 AND ONGOING.  PT HAS NO COMPLAINTS OF SOB AND WT GAIN.  PT DENIES ANY MISSED DOSES OF FLUID PILL. NORMAL DEVICE FUNCTION.  CHANGED LV OUTPUT FROM 3.5 TO 3.0 V.  ROV IN 3 MTHS W/DEVICE CLINIC. [name]  August 05, 2010 3:14 PM
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
## 2010-12-29 NOTE — Miscellaneous (Signed)
Summary: Plan of Care & Treatment/[facility]  Plan of Care & Treatment/[facility]   Imported By: Sherian Rein 04/07/2010 10:56:26  _____________________________________________________________________  External Attachment:    Type:   Image     Comment:   External Document

## 2010-12-29 NOTE — Progress Notes (Signed)
Summary: Urologist  Phone Note Call from Patient Call back at [facility] Phone [phone]   Caller: Patient Summary of Call: Pt called requesting referral to a different Urologist. Pt states that he is not confident in current Urologist, Dr Vernie Ammons at [facility]. Initial call taken by: Margaret Pyle, CMA,  July 16, 2010 9:40 AM  Follow-up for Phone Call        I think he can request to see a different urologist on his own;  please ask pt to do this;    if we need to be involved, let me know Follow-up by: Corwin Levins MD,  July 16, 2010 1:09 PM  Additional Follow-up for Phone Call Additional follow up Details #1::        left message on machine for pt to return my call Margaret Pyle, CMA  July 16, 2010 2:27 PM   left message on machine for pt to return my call  Margaret Pyle, CMA  July 20, 2010 1:29 PM  left message on machine for pt to return my call, closing phone note until further contact from pt Additional Follow-up by: Margaret Pyle, CMA,  July 21, 2010 8:43 AM

## 2010-12-29 NOTE — Progress Notes (Signed)
Summary: FYI  Phone Note Other Incoming   CallerTresa Endo RN @ [facility] [phone] Summary of Call: RN called to inform MD that pt had appt with her today @1p . Pt was not home when RN arrived but she left a note on pt's door. This was an appt that was pre-scheduled and pt was informed. Initial call taken by: Margaret Pyle, CMA,  February 12, 2010 1:57 PM  Follow-up for Phone Call        noted Follow-up by: Corwin Levins MD,  February 12, 2010 1:59 PM

## 2010-12-29 NOTE — Miscellaneous (Signed)
Summary: Plan of Care & Treatment/[facility]  Plan of Care & Treatment/[facility]   Imported By: Sherian Rein 02/09/2010 09:35:42  _____________________________________________________________________  External Attachment:    Type:   Image     Comment:   External Document
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
## 2010-12-29 NOTE — Letter (Signed)
Summary: [facility]  [facility]   Imported By: Sherian Rein 01/13/2010 08:01:29  _____________________________________________________________________  External Attachment:    Type:   Image     Comment:   External Document
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
## 2010-12-29 NOTE — Progress Notes (Signed)
Summary: [facility] report?  Phone Note Call from Patient Call back at Home Phone [phone]   Caller: Patient Summary of Call: Pt called requesting a copy of report from recent stay at [facility] to be picked up. Pt also wanted to know if MD reviewed these notes. I attempted to print from E-chart but I can not do it. Can MD please review and print this report for pt?  Initial call taken by: Margaret Pyle, CMA,  June 19, 2010 11:59 AM  Follow-up for Phone Call        I am not sure to which "report" her refers;    there are no physician dictations on Echart since march 2011 Follow-up by: Corwin Levins MD,  June 19, 2010 12:48 PM  Additional Follow-up for Phone Call Additional follow up Details #1::        pt states that he wanted a copy of ER report from Oct 2009 to compare with last ER visit. Pt states he needed the name of Attending physician from that ER visit. Report printed and put upfront for pt pick up.  Additional Follow-up by: Margaret Pyle, CMA,  June 19, 2010 1:25 PM

## 2010-12-31 NOTE — Letter (Signed)
Summary: Implantable Device Instructions  Architectural technologist, Main Office  [address]   [place], [place] [address]   Phone: [phone]  Fax: [phone]      Implantable Device Instructions  You are scheduled for:  _____ Generator Change  on 02/06/11 with Dr. Ladona Ridgel.  1.  Please arrive at the Short Stay Center at [facility] at 7:30am on the day of your procedure.  2.  Do not eat or drink the night before your procedure.  3.  Complete lab work on 01/01/11.  .  You do not have to be fasting.  4.  Do NOT take these medications for the morning of your procedure:  Glucotrol, Lasix, hold Plavix  for 2 days, 1/2 Lantus dose.  5.  Plan for an overnight stay.  Bring your insurance cards and a list of your medications.  6.  Wash your chest and neck with antibacterial soap (any brand) the evening before and the morning of your procedure.  Rinse well.    *If you have ANY questions after you get home, please call the office [phone]. Anselm Pancoast  *Every attempt is made to prevent procedures from being rescheduled.  Due to the nauture of Electrophysiology, rescheduling can happen.  The physician is always aware and directs the staff when this occurs.

## 2010-12-31 NOTE — Progress Notes (Signed)
Summary: Hydrologist Note Call from Patient Call back at Home Phone [phone]   Caller: Patient Call For: [name] Levins MD Summary of Call: Pt needs repairs to wheelchair, [facility] [phone] told pt to call primary care M.D. Initial call taken by: Verdell Face,  November 12, 2010 10:34 AM  Follow-up for Phone Call        I believe this has been done at least once before.  I seem to recall we received documentation to request this in the past.  I would not o/w know how to address this Follow-up by: [name] Levins MD,  November 12, 2010 12:55 PM  Additional Follow-up for Phone Call Additional follow up Details #1::        Per pt, supplier has already come out and began repairing his wheelchair Additional Follow-up by: Margaret Pyle, CMA,  November 12, 2010 1:40 PM
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
## 2011-01-01 ENCOUNTER — Encounter: Payer: Self-pay | Admitting: Internal Medicine

## 2011-01-01 ENCOUNTER — Other Ambulatory Visit: Payer: Self-pay

## 2011-01-01 ENCOUNTER — Other Ambulatory Visit (INDEPENDENT_AMBULATORY_CARE_PROVIDER_SITE_OTHER): Payer: 59

## 2011-01-01 DIAGNOSIS — I509 Heart failure, unspecified: Secondary | ICD-10-CM

## 2011-01-01 DIAGNOSIS — Z95810 Presence of automatic (implantable) cardiac defibrillator: Secondary | ICD-10-CM

## 2011-01-01 LAB — CBC WITH DIFFERENTIAL/PLATELET
Basophils Absolute: 0 10*3/uL (ref 0.0–0.1)
Basophils Relative: 0.4 % (ref 0.0–3.0)
Eosinophils Absolute: 0.1 10*3/uL (ref 0.0–0.7)
HCT: 39.6 % (ref 39.0–52.0)
Hemoglobin: 13.4 g/dL (ref 13.0–17.0)
Lymphocytes Relative: 30 % (ref 12.0–46.0)
Lymphs Abs: 1 10*3/uL (ref 0.7–4.0)
MCHC: 33.8 g/dL (ref 30.0–36.0)
MCV: 87.8 fl (ref 78.0–100.0)
Monocytes Absolute: 0.4 10*3/uL (ref 0.1–1.0)
Neutro Abs: 1.9 10*3/uL (ref 1.4–7.7)
RBC: 4.51 Mil/uL (ref 4.22–5.81)
RDW: 14.8 % — ABNORMAL HIGH (ref 11.5–14.6)

## 2011-01-01 LAB — BASIC METABOLIC PANEL
CO2: 32 mEq/L (ref 19–32)
Chloride: 101 mEq/L (ref 96–112)
GFR: 63.25 mL/min (ref >60.00)
Glucose, Bld: 289 mg/dL — ABNORMAL HIGH (ref 70–99)
Potassium: 4.1 mEq/L (ref 3.5–5.1)
Sodium: 139 mEq/L (ref 135–145)

## 2011-01-01 NOTE — Medication Information (Signed)
Summary: [facility]  [facility]   Imported By: Sherian Rein 09/28/2010 07:33:14  _____________________________________________________________________  External Attachment:    Type:   Image     Comment:   External Document  Appended Document: [facility] Pt called to advise that he has since changed his mind and will no longer be getting Diabetic supplies through this company

## 2011-01-04 ENCOUNTER — Telehealth: Payer: Self-pay | Admitting: Internal Medicine

## 2011-01-05 ENCOUNTER — Telehealth: Payer: Self-pay | Admitting: Internal Medicine

## 2011-01-06 ENCOUNTER — Telehealth: Payer: Self-pay | Admitting: Internal Medicine

## 2011-01-08 ENCOUNTER — Ambulatory Visit (HOSPITAL_COMMUNITY)
Admission: RE | Admit: 2011-01-08 | Discharge: 2011-01-08 | Disposition: A | Discharge status: Home / Self Care | Payer: Medicare Other | Source: Ambulatory Visit | Attending: Internal Medicine | Admitting: Internal Medicine

## 2011-01-08 DIAGNOSIS — I5022 Chronic systolic (congestive) heart failure: Secondary | ICD-10-CM

## 2011-01-08 DIAGNOSIS — I2589 Other forms of chronic ischemic heart disease: Secondary | ICD-10-CM

## 2011-01-08 DIAGNOSIS — Z4502 Encounter for adjustment and management of automatic implantable cardiac defibrillator: Secondary | ICD-10-CM | POA: Insufficient documentation

## 2011-01-08 DIAGNOSIS — I509 Heart failure, unspecified: Secondary | ICD-10-CM | POA: Insufficient documentation

## 2011-01-08 LAB — SURGICAL PCR SCREEN
MRSA, PCR: NEGATIVE
Staphylococcus aureus: NEGATIVE

## 2011-01-08 LAB — GLUCOSE, CAPILLARY: Glucose-Capillary: 173 mg/dL — ABNORMAL HIGH (ref 70–99)

## 2011-01-09 ENCOUNTER — Emergency Department (HOSPITAL_COMMUNITY)
Admission: EM | Admit: 2011-01-09 | Discharge: 2011-01-09 | Disposition: A | Discharge status: Home / Self Care | Payer: Medicare Other | Attending: Emergency Medicine | Admitting: Emergency Medicine

## 2011-01-09 DIAGNOSIS — Z95810 Presence of automatic (implantable) cardiac defibrillator: Secondary | ICD-10-CM | POA: Insufficient documentation

## 2011-01-09 DIAGNOSIS — I10 Essential (primary) hypertension: Secondary | ICD-10-CM | POA: Insufficient documentation

## 2011-01-09 DIAGNOSIS — R079 Chest pain, unspecified: Secondary | ICD-10-CM | POA: Insufficient documentation

## 2011-01-09 DIAGNOSIS — F22 Delusional disorders: Secondary | ICD-10-CM | POA: Insufficient documentation

## 2011-01-09 DIAGNOSIS — R609 Edema, unspecified: Secondary | ICD-10-CM | POA: Insufficient documentation

## 2011-01-09 DIAGNOSIS — H18419 Arcus senilis, unspecified eye: Secondary | ICD-10-CM | POA: Insufficient documentation

## 2011-01-09 DIAGNOSIS — I2510 Atherosclerotic heart disease of native coronary artery without angina pectoris: Secondary | ICD-10-CM | POA: Insufficient documentation

## 2011-01-09 DIAGNOSIS — I252 Old myocardial infarction: Secondary | ICD-10-CM | POA: Insufficient documentation

## 2011-01-09 DIAGNOSIS — E039 Hypothyroidism, unspecified: Secondary | ICD-10-CM | POA: Insufficient documentation

## 2011-01-09 DIAGNOSIS — S88119A Complete traumatic amputation at level between knee and ankle, unspecified lower leg, initial encounter: Secondary | ICD-10-CM | POA: Insufficient documentation

## 2011-01-09 DIAGNOSIS — G8918 Other acute postprocedural pain: Secondary | ICD-10-CM | POA: Insufficient documentation

## 2011-01-09 DIAGNOSIS — E785 Hyperlipidemia, unspecified: Secondary | ICD-10-CM | POA: Insufficient documentation

## 2011-01-09 DIAGNOSIS — N390 Urinary tract infection, site not specified: Secondary | ICD-10-CM | POA: Insufficient documentation

## 2011-01-09 DIAGNOSIS — K219 Gastro-esophageal reflux disease without esophagitis: Secondary | ICD-10-CM | POA: Insufficient documentation

## 2011-01-09 DIAGNOSIS — E119 Type 2 diabetes mellitus without complications: Secondary | ICD-10-CM | POA: Insufficient documentation

## 2011-01-09 DIAGNOSIS — Z8546 Personal history of malignant neoplasm of prostate: Secondary | ICD-10-CM | POA: Insufficient documentation

## 2011-01-09 LAB — POCT I-STAT, CHEM 8
Calcium, Ion: 1.02 mmol/L — ABNORMAL LOW (ref 1.12–1.32)
Chloride: 103 mEq/L (ref 96–112)
Creatinine, Ser: 1.3 mg/dL (ref 0.4–1.5)
Glucose, Bld: 312 mg/dL — ABNORMAL HIGH (ref 70–99)
Potassium: 3.6 mEq/L (ref 3.5–5.1)

## 2011-01-09 LAB — URINALYSIS, ROUTINE W REFLEX MICROSCOPIC
Nitrite: NEGATIVE
Protein, ur: NEGATIVE mg/dL
Specific Gravity, Urine: 1.033 — ABNORMAL HIGH (ref 1.005–1.030)
Urobilinogen, UA: 0.2 mg/dL (ref 0.0–1.0)

## 2011-01-09 LAB — DIFFERENTIAL
Basophils Absolute: 0 10*3/uL (ref 0.0–0.1)
Basophils Relative: 0 % (ref 0–1)
Eosinophils Absolute: 0.1 10*3/uL (ref 0.0–0.7)
Monocytes Relative: 10 % (ref 3–12)
Neutrophils Relative %: 68 % (ref 43–77)

## 2011-01-09 LAB — RAPID URINE DRUG SCREEN, HOSP PERFORMED
Amphetamines: NOT DETECTED
Benzodiazepines: POSITIVE — AB
Tetrahydrocannabinol: NOT DETECTED

## 2011-01-09 LAB — URINE MICROSCOPIC-ADD ON

## 2011-01-09 LAB — CBC
HCT: 40.2 % (ref 39.0–52.0)
Hemoglobin: 13.8 g/dL (ref 13.0–17.0)
MCHC: 34.3 g/dL (ref 30.0–36.0)
MCV: 84.5 fL (ref 78.0–100.0)

## 2011-01-09 LAB — POCT CARDIAC MARKERS
CKMB, poc: 1.8 ng/mL (ref 1.0–8.0)
Troponin i, poc: <0.05 ng/mL (ref 0.00–0.09)

## 2011-01-10 LAB — URINE CULTURE

## 2011-01-11 ENCOUNTER — Ambulatory Visit: Payer: 59 | Admitting: Internal Medicine

## 2011-01-14 NOTE — Progress Notes (Signed)
Summary: Rx refill req  Phone Note Refill Request Message from:  Fax from Pharmacy on January 04, 2011 10:08 AM  Refills Requested: Medication #1:  PLAVIX 75 MG TABS Take 1 tablet by mouth once a day   Dosage confirmed as above?Dosage Confirmed   Supply Requested: 3 months   Notes: fax [phone]  Method Requested: Fax to Local Pharmacy Initial call taken by: Margaret Pyle, CMA,  January 04, 2011 10:08 AM    Prescriptions: PLAVIX 75 MG TABS (CLOPIDOGREL BISULFATE) Take 1 tablet by mouth once a day  #30 x 11   Entered by:   Margaret Pyle, CMA   Authorized by:   Corwin Levins MD   Signed by:   Margaret Pyle, CMA on 01/04/2011   Method used:   Electronically to        [facility] [phone]* (retail)       [address]       [place], [place]  [address]       Ph: [phone]       Fax: [phone]   RxID:   [id number]   already done Dec 31, 2010 Corwin Levins MD  January 04, 2011 11:42 AM   Rx done 12/31/2009, Rx refilled as req Margaret Pyle, CMA  January 04, 2011 11:51 AM

## 2011-01-14 NOTE — Op Note (Signed)
  [name], [name]                  ACCOUNT NO.:  [id number]  MEDICAL RECORD NO.:  [id number]           PATIENT TYPE:  O  LOCATION:  [facility]                         FACILITY:  [facility]  PHYSICIAN:  Doylene Canning. Ladona Ridgel, MD    DATE OF BIRTH:  1929-04-02  DATE OF PROCEDURE:  01/08/2011 DATE OF DISCHARGE:  01/08/2011                              OPERATIVE REPORT   PROCEDURE PERFORMED:  Removal of a previously implanted BiV ICD, which reached ERI, insertion of the body pacemaker.  INTRODUCTION:  The patient is a very pleasant 77 year old male with an ischemic cardiomyopathy, congestive heart failure status post BiV ICD insertion in the past.  He has never had defibrillator therapy.  He has a 6949 lead placed and he is referred now for removal of his previously implanted defibrillator and insertion of a new BiV pacemaker.  PROCEDURE:  After informed consent was obtained, the patient was taken to the diagnostic EP lab in a fasting state.  After usual preparation and draping, intravenous fentanyl and midazolam was given for sedation. 30 mL of lidocaine was infiltrated into the left infraclavicular region. A 7-cm incision was carried out over this region.  Electrocautery was utilized to dissect down to the ICD pocket.  Generator was removed with gentle traction.  The leads were evaluated and found to be working satisfactorily.  The patient did have a 6949 defibrillation lead.  After the pocket was irrigated with gentamicin irrigation, the new Medtronic BiV pacemaker, serial number [id number] was connected to the rate sense portion of the pacing lead, which was placed in the LV port and the LV lead was placed in the RV port, atrial lead was placed in the atrial port.  At this point, the shocking coils were capped and the device was placed back in the subcutaneous pocket.  Electrocautery was utilized to assure hemostasis.  The pocket was irrigated with gentamicin.  The incision closed with 2-0 and  3-0 Vicryl.  Benzoin and Steri-Strips were painted on the skin.  Pressure dressing applied and the patient was returned to his room in a satisfactory condition.  COMPLICATIONS:  There were no immediate procedure complications.  RESULTS:  This demonstrates successful removal of previously implanted BiV ICD and insertion of a new BiV pacemaker without immediate procedural complication.  It should be noted that the patient's LV and RV leads were swapped in position as the patient had a 6949 defibrillator lead with the obvious concern of potential rate sense lead fracture in the future.  Of note, however, the device is working normally.     Doylene Canning. Ladona Ridgel, MD     GWT/MEDQ  D:  01/08/2011  T:  01/09/2011  Job:  [id number]  Electronically Signed by Lewayne Bunting MD on 01/14/2011 05:44:08 PM
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
## 2011-01-14 NOTE — Progress Notes (Signed)
Summary: Calling regarding procedure for Friday   Phone Note Call from Patient Call back at Home Phone [phone]   Caller: Patient Summary of Call: Calling regarding proceure on Friday need to R/S procedure Initial call taken by: Judie Grieve,  January 05, 2011 8:11 AM  Follow-up for Phone Call        spoke with pt is requesting to have a gen change only not down grade to a Bi-V PPM He is going to inform the doctor as well Dennis Bast, RN, BSN  January 05, 2011 9:24 AM

## 2011-01-18 ENCOUNTER — Ambulatory Visit: Payer: 59

## 2011-01-20 ENCOUNTER — Encounter: Payer: Self-pay | Admitting: Internal Medicine

## 2011-01-20 ENCOUNTER — Ambulatory Visit (INDEPENDENT_AMBULATORY_CARE_PROVIDER_SITE_OTHER): Payer: Medicare Other

## 2011-01-20 DIAGNOSIS — I495 Sick sinus syndrome: Secondary | ICD-10-CM

## 2011-01-20 DIAGNOSIS — I509 Heart failure, unspecified: Secondary | ICD-10-CM

## 2011-02-04 NOTE — Procedures (Signed)
Summary: wound check.pacer.[name] rs pt's appt per [name]/mt      Allergies Added:   Current Medications (verified): 1)  Coreg 25 Mg Tabs (Carvedilol) .Marland Kitchen.. 1 By Mouth Two Times A Day 2)  Glucotrol Xl 10 Mg Tb24 (Glipizide) .Marland Kitchen.. 1 By Mouth Once Daily 3)  Lasix 40 Mg Tabs (Furosemide) .Marland Kitchen.. 1 By Mouth Once Daily 4)  Omeprazole 20 Mg Cpdr (Omeprazole) .Marland Kitchen.. 1 By Mouth Once Daily 5)  Simvastatin 80 Mg  Tabs (Simvastatin) .Marland Kitchen.. 1 By Mouth At Bedtime 6)  Isosorbide Dinitrate 30 Mg  Tabs (Isosorbide Dinitrate) .Marland Kitchen.. 1 By Mouth Two Times A Day 7)  Avapro 300 Mg  Tabs (Irbesartan) .Marland Kitchen.. 1 By Mouth Once Daily 8)  Levothroid 150 Mcg  Tabs (Levothyroxine Sodium) .Marland Kitchen.. 1 By Mouth Once Daily 9)  Klor-Con M20 20 Meq  Tbcr (Potassium Chloride Crys Cr) .Marland Kitchen.. 1 By Mouth Once Daily 10)  Plavix 75 Mg Tabs (Clopidogrel Bisulfate) .... Take 1 Tablet By Mouth Once A Day 11)  Freestyle Lite Test  Strp (Glucose Blood) .... Use As Directed 12)  12v 33ah J Term Dc Agm ZOXW96-04V Battery .... Use Asd 13)  Repair of Power Wheelchair .... As Directed 14)  Lantus 100 Unit/ml Soln (Insulin Glargine) .... Uad - 100 Units Subcutaneously Once Daily 15)  Nitrofurantoin Macrocrystal 50 Mg Caps (Nitrofurantoin Macrocrystal) .Marland Kitchen.. 1 By Mouth Once Daily 16)  Avodart 0.5 Mg Caps (Dutasteride) .Marland Kitchen.. 1po Once Daily  Allergies (verified): 1)  ! Percocet 2)  ! Percodan  Tech Comments:  see paceart report. [name]  January 20, 2011 9:56 AM  ICD Specifications Following MD:  [name] Bunting, MD     ICD Vendor:  Medtronic     ICD Model Number:  [id number]     ICD Serial Number:  [id number] ICD DOI:  05/25/2006     ICD Implanting MD:  [name]  Lead 1:    Location: RA     DOI: 05/25/2001     Model #: [id number]     Serial #: [id number]     Status: active Lead 2:    Location: RV     DOI: 05/25/2006     Model #: [id number]     Serial #: [id number]     Status: active Lead 3:    Location: LV     DOI: 05/25/2006     Model #: [id number]     Serial #:  [id number]     Status: active  Indications::  CHF, LV DYSFUNCTION   ICD Follow Up ICD Dependent:  No       ICD Device Measurements Configuration: LV TIP TO RV COIL  Episodes Coumadin:  No  Brady Parameters Mode DDD     Lower Rate Limit:  50     Upper Rate Limit 130 PAV 180     Sensed AV Delay:  140  Tachy Zones VF:  188     VT:  171

## 2011-02-09 ENCOUNTER — Telehealth: Payer: Self-pay | Admitting: Internal Medicine

## 2011-02-09 ENCOUNTER — Telehealth (INDEPENDENT_AMBULATORY_CARE_PROVIDER_SITE_OTHER): Payer: Self-pay | Admitting: *Deleted

## 2011-02-09 NOTE — Cardiovascular Report (Signed)
Summary: Office Visit   Office Visit   Imported By: Roderic Ovens 02/03/2011 12:23:10  _____________________________________________________________________  External Attachment:    Type:   Image     Comment:   External Document

## 2011-02-11 LAB — POCT I-STAT, CHEM 8
BUN: 26 mg/dL — ABNORMAL HIGH (ref 6–23)
Chloride: 98 mEq/L (ref 96–112)
Potassium: 4.8 mEq/L (ref 3.5–5.1)
Sodium: 136 mEq/L (ref 135–145)
TCO2: 32 mmol/L (ref 0–100)

## 2011-02-11 LAB — GLUCOSE, CAPILLARY
Glucose-Capillary: 276 mg/dL — ABNORMAL HIGH (ref 70–99)
Glucose-Capillary: 343 mg/dL — ABNORMAL HIGH (ref 70–99)

## 2011-02-13 LAB — POCT CARDIAC MARKERS
CKMB, poc: 3 ng/mL (ref 1.0–8.0)
Myoglobin, poc: 162 ng/mL (ref 12–200)
Troponin i, poc: <0.05 ng/mL (ref 0.00–0.09)

## 2011-02-16 LAB — POCT CARDIAC MARKERS
Myoglobin, poc: 175 ng/mL (ref 12–200)
Troponin i, poc: <0.05 ng/mL (ref 0.00–0.09)

## 2011-02-16 LAB — GC/CHLAMYDIA PROBE AMP, GENITAL
Chlamydia, DNA Probe: NEGATIVE
GC Probe Amp, Genital: NEGATIVE

## 2011-02-16 LAB — COMPREHENSIVE METABOLIC PANEL
ALT: 36 U/L (ref 0–53)
AST: 31 U/L (ref 0–37)
Calcium: 8.4 mg/dL (ref 8.4–10.5)
GFR calc Af Amer: 50 mL/min — ABNORMAL LOW (ref >60)
Sodium: 135 mEq/L (ref 135–145)
Total Protein: 6.5 g/dL (ref 6.0–8.3)

## 2011-02-16 LAB — URINALYSIS, ROUTINE W REFLEX MICROSCOPIC
Bilirubin Urine: NEGATIVE
Bilirubin Urine: NEGATIVE
Glucose, UA: >1000 mg/dL — AB
Ketones, ur: NEGATIVE mg/dL
Specific Gravity, Urine: 1.025 (ref 1.005–1.030)
Urobilinogen, UA: 0.2 mg/dL (ref 0.0–1.0)
pH: 5 (ref 5.0–8.0)

## 2011-02-16 LAB — GLUCOSE, CAPILLARY: Glucose-Capillary: 239 mg/dL — ABNORMAL HIGH (ref 70–99)

## 2011-02-16 LAB — DIFFERENTIAL
Eosinophils Absolute: 0.1 10*3/uL (ref 0.0–0.7)
Eosinophils Relative: 2 % (ref 0–5)
Lymphs Abs: 1 10*3/uL (ref 0.7–4.0)
Monocytes Relative: 9 % (ref 3–12)

## 2011-02-16 LAB — CBC
MCHC: 35 g/dL (ref 30.0–36.0)
RBC: 4.68 MIL/uL (ref 4.22–5.81)
RDW: 14.3 % (ref 11.5–15.5)

## 2011-02-16 LAB — URINE CULTURE: Colony Count: NO GROWTH

## 2011-02-16 LAB — URINE MICROSCOPIC-ADD ON

## 2011-02-16 NOTE — Progress Notes (Signed)
Summary: ROI Received   Patient signed release, I put it in the [employer]. Patient would like to be called to pick up the records when they are ready. Debby Freiberg  February 09, 2011 12:57 PM

## 2011-02-19 ENCOUNTER — Telehealth: Payer: Self-pay

## 2011-02-19 LAB — DIFFERENTIAL
Basophils Absolute: 0 10*3/uL (ref 0.0–0.1)
Basophils Relative: 0 % (ref 0–1)
Eosinophils Absolute: 0 10*3/uL (ref 0.0–0.7)
Eosinophils Relative: 0 % (ref 0–5)
Lymphocytes Relative: 4 % — ABNORMAL LOW (ref 12–46)
Lymphocytes Relative: 6 % — ABNORMAL LOW (ref 12–46)
Lymphs Abs: 0.6 10*3/uL — ABNORMAL LOW (ref 0.7–4.0)
Monocytes Absolute: 0.5 10*3/uL (ref 0.1–1.0)
Monocytes Absolute: 0.6 10*3/uL (ref 0.1–1.0)
Neutro Abs: 7.4 10*3/uL (ref 1.7–7.7)
Neutrophils Relative %: 90 % — ABNORMAL HIGH (ref 43–77)

## 2011-02-19 LAB — GLUCOSE, CAPILLARY
Glucose-Capillary: 123 mg/dL — ABNORMAL HIGH (ref 70–99)
Glucose-Capillary: 129 mg/dL — ABNORMAL HIGH (ref 70–99)
Glucose-Capillary: 131 mg/dL — ABNORMAL HIGH (ref 70–99)
Glucose-Capillary: 131 mg/dL — ABNORMAL HIGH (ref 70–99)
Glucose-Capillary: 137 mg/dL — ABNORMAL HIGH (ref 70–99)
Glucose-Capillary: 138 mg/dL — ABNORMAL HIGH (ref 70–99)
Glucose-Capillary: 141 mg/dL — ABNORMAL HIGH (ref 70–99)
Glucose-Capillary: 144 mg/dL — ABNORMAL HIGH (ref 70–99)
Glucose-Capillary: 152 mg/dL — ABNORMAL HIGH (ref 70–99)
Glucose-Capillary: 182 mg/dL — ABNORMAL HIGH (ref 70–99)

## 2011-02-19 LAB — CBC
HCT: 36.9 % — ABNORMAL LOW (ref 39.0–52.0)
HCT: 39.3 % (ref 39.0–52.0)
Hemoglobin: 13.3 g/dL (ref 13.0–17.0)
Hemoglobin: 14.5 g/dL (ref 13.0–17.0)
MCHC: 34 g/dL (ref 30.0–36.0)
MCHC: 34.4 g/dL (ref 30.0–36.0)
MCV: 89.8 fL (ref 78.0–100.0)
Platelets: 101 10*3/uL — ABNORMAL LOW (ref 150–400)
Platelets: 119 10*3/uL — ABNORMAL LOW (ref 150–400)
RBC: 4.74 MIL/uL (ref 4.22–5.81)
RDW: 13.8 % (ref 11.5–15.5)
RDW: 13.9 % (ref 11.5–15.5)
RDW: 14 % (ref 11.5–15.5)
RDW: 14.2 % (ref 11.5–15.5)
WBC: 9.1 10*3/uL (ref 4.0–10.5)

## 2011-02-19 LAB — URINALYSIS, ROUTINE W REFLEX MICROSCOPIC
Glucose, UA: 250 mg/dL — AB
Nitrite: NEGATIVE
Specific Gravity, Urine: 1.021 (ref 1.005–1.030)
pH: 5.5 (ref 5.0–8.0)

## 2011-02-19 LAB — POCT I-STAT, CHEM 8
BUN: 17 mg/dL (ref 6–23)
Calcium, Ion: 1.01 mmol/L — ABNORMAL LOW (ref 1.12–1.32)
Chloride: 104 mEq/L (ref 96–112)
HCT: 44 % (ref 39.0–52.0)
Potassium: 3.6 mEq/L (ref 3.5–5.1)

## 2011-02-19 LAB — CULTURE, RESPIRATORY W GRAM STAIN: Culture: NORMAL

## 2011-02-19 LAB — BASIC METABOLIC PANEL
BUN: 11 mg/dL (ref 6–23)
BUN: 12 mg/dL (ref 6–23)
BUN: 8 mg/dL (ref 6–23)
CO2: 28 mEq/L (ref 19–32)
Calcium: 8 mg/dL — ABNORMAL LOW (ref 8.4–10.5)
Calcium: 8 mg/dL — ABNORMAL LOW (ref 8.4–10.5)
Chloride: 103 mEq/L (ref 96–112)
Creatinine, Ser: 0.99 mg/dL (ref 0.4–1.5)
Creatinine, Ser: 1.15 mg/dL (ref 0.4–1.5)
GFR calc Af Amer: >60 mL/min (ref >60)
GFR calc non Af Amer: >60 mL/min (ref >60)
GFR calc non Af Amer: >60 mL/min (ref >60)
GFR calc non Af Amer: >60 mL/min (ref >60)
Glucose, Bld: 111 mg/dL — ABNORMAL HIGH (ref 70–99)
Glucose, Bld: 111 mg/dL — ABNORMAL HIGH (ref 70–99)
Glucose, Bld: 125 mg/dL — ABNORMAL HIGH (ref 70–99)
Glucose, Bld: 135 mg/dL — ABNORMAL HIGH (ref 70–99)
Potassium: 3.1 mEq/L — ABNORMAL LOW (ref 3.5–5.1)
Potassium: 3.3 mEq/L — ABNORMAL LOW (ref 3.5–5.1)
Potassium: 3.5 mEq/L (ref 3.5–5.1)
Sodium: 137 mEq/L (ref 135–145)

## 2011-02-19 LAB — CULTURE, BLOOD (ROUTINE X 2): Culture: NO GROWTH

## 2011-02-19 LAB — URINE MICROSCOPIC-ADD ON

## 2011-02-19 LAB — URINE CULTURE

## 2011-02-19 LAB — EXPECTORATED SPUTUM ASSESSMENT W GRAM STAIN, RFLX TO RESP C

## 2011-02-19 LAB — LACTIC ACID, PLASMA: Lactic Acid, Venous: 1.4 mmol/L (ref 0.5–2.2)

## 2011-02-19 LAB — BRAIN NATRIURETIC PEPTIDE: Pro B Natriuretic peptide (BNP): 78 pg/mL (ref 0.0–100.0)

## 2011-02-19 NOTE — Telephone Encounter (Signed)
Pt last seen here by me may 2011  Needs OV

## 2011-02-19 NOTE — Telephone Encounter (Signed)
Pt called stating he has been experiencing phantom leg pain of the leg that had been amputated. Pt is requesting Rx for pain meds, please advise

## 2011-02-20 NOTE — Telephone Encounter (Signed)
Pt advised that he is no longer in pain and will call back to make OV if needed.

## 2011-02-23 ENCOUNTER — Emergency Department (HOSPITAL_COMMUNITY)
Admission: EM | Admit: 2011-02-23 | Discharge: 2011-02-23 | Disposition: A | Discharge status: Home / Self Care | Payer: Medicare Other | Attending: Emergency Medicine | Admitting: Emergency Medicine

## 2011-02-23 ENCOUNTER — Emergency Department (HOSPITAL_COMMUNITY): Payer: Medicare Other

## 2011-02-23 DIAGNOSIS — I252 Old myocardial infarction: Secondary | ICD-10-CM | POA: Insufficient documentation

## 2011-02-23 DIAGNOSIS — E119 Type 2 diabetes mellitus without complications: Secondary | ICD-10-CM | POA: Insufficient documentation

## 2011-02-23 DIAGNOSIS — Z8546 Personal history of malignant neoplasm of prostate: Secondary | ICD-10-CM | POA: Insufficient documentation

## 2011-02-23 DIAGNOSIS — Z950 Presence of cardiac pacemaker: Secondary | ICD-10-CM | POA: Insufficient documentation

## 2011-02-23 DIAGNOSIS — R109 Unspecified abdominal pain: Secondary | ICD-10-CM | POA: Insufficient documentation

## 2011-02-23 DIAGNOSIS — S88119A Complete traumatic amputation at level between knee and ankle, unspecified lower leg, initial encounter: Secondary | ICD-10-CM | POA: Insufficient documentation

## 2011-02-23 DIAGNOSIS — I10 Essential (primary) hypertension: Secondary | ICD-10-CM | POA: Insufficient documentation

## 2011-02-23 DIAGNOSIS — I2510 Atherosclerotic heart disease of native coronary artery without angina pectoris: Secondary | ICD-10-CM | POA: Insufficient documentation

## 2011-02-23 DIAGNOSIS — N390 Urinary tract infection, site not specified: Secondary | ICD-10-CM | POA: Insufficient documentation

## 2011-02-23 DIAGNOSIS — E039 Hypothyroidism, unspecified: Secondary | ICD-10-CM | POA: Insufficient documentation

## 2011-02-23 LAB — CBC
Hemoglobin: 14 g/dL (ref 13.0–17.0)
MCH: 28.3 pg (ref 26.0–34.0)
MCV: 84.2 fL (ref 78.0–100.0)
RBC: 4.94 MIL/uL (ref 4.22–5.81)
WBC: 4.9 10*3/uL (ref 4.0–10.5)

## 2011-02-23 LAB — COMPREHENSIVE METABOLIC PANEL
Albumin: 3.4 g/dL — ABNORMAL LOW (ref 3.5–5.2)
Alkaline Phosphatase: 69 U/L (ref 39–117)
BUN: 20 mg/dL (ref 6–23)
Calcium: 8.5 mg/dL (ref 8.4–10.5)
Glucose, Bld: 241 mg/dL — ABNORMAL HIGH (ref 70–99)
Potassium: 4.1 mEq/L (ref 3.5–5.1)
Total Protein: 6.8 g/dL (ref 6.0–8.3)

## 2011-02-23 LAB — URINE MICROSCOPIC-ADD ON

## 2011-02-23 LAB — DIFFERENTIAL
Eosinophils Absolute: 0.1 10*3/uL (ref 0.0–0.7)
Eosinophils Relative: 1 % (ref 0–5)
Lymphocytes Relative: 21 % (ref 12–46)
Lymphs Abs: 1 10*3/uL (ref 0.7–4.0)
Monocytes Relative: 9 % (ref 3–12)

## 2011-02-23 LAB — URINALYSIS, ROUTINE W REFLEX MICROSCOPIC
Glucose, UA: >1000 mg/dL — AB
Ketones, ur: NEGATIVE mg/dL
Protein, ur: NEGATIVE mg/dL
pH: 5.5 (ref 5.0–8.0)

## 2011-02-23 LAB — LIPASE, BLOOD: Lipase: 20 U/L (ref 11–59)

## 2011-02-24 LAB — URINE CULTURE
Colony Count: 30000
Culture  Setup Time: 201203271622

## 2011-02-25 NOTE — Progress Notes (Signed)
Summary: question re pt condition   Phone Note From Other Clinic   Caller: case manager dorothy knight [phone] Summary of Call: has concern re pt condition. Initial call taken by: Roe Coombs,  February 09, 2011 2:56 PM  Follow-up for Phone Call        lmom for case manager to call me back Dennis Bast, RN, BSN  February 09, 2011 3:39 PM lmom for case manager to call me back again Dennis Bast, RN, BSN  February 15, 2011 3:26 PM

## 2011-02-26 ENCOUNTER — Ambulatory Visit: Payer: Medicare Other | Admitting: Internal Medicine

## 2011-03-01 ENCOUNTER — Other Ambulatory Visit (INDEPENDENT_AMBULATORY_CARE_PROVIDER_SITE_OTHER): Payer: Medicare Other

## 2011-03-01 ENCOUNTER — Ambulatory Visit (INDEPENDENT_AMBULATORY_CARE_PROVIDER_SITE_OTHER): Payer: Medicare Other | Admitting: Internal Medicine

## 2011-03-01 ENCOUNTER — Encounter: Payer: Self-pay | Admitting: Internal Medicine

## 2011-03-01 DIAGNOSIS — E119 Type 2 diabetes mellitus without complications: Secondary | ICD-10-CM

## 2011-03-01 DIAGNOSIS — Z0000 Encounter for general adult medical examination without abnormal findings: Secondary | ICD-10-CM

## 2011-03-01 DIAGNOSIS — N390 Urinary tract infection, site not specified: Secondary | ICD-10-CM

## 2011-03-01 LAB — BASIC METABOLIC PANEL
CO2: 25 mEq/L (ref 19–32)
Calcium: 8.5 mg/dL (ref 8.4–10.5)
Creatinine, Ser: 2 mg/dL — ABNORMAL HIGH (ref 0.4–1.5)

## 2011-03-01 LAB — CBC WITH DIFFERENTIAL/PLATELET
Basophils Relative: 0.2 % (ref 0.0–3.0)
Eosinophils Relative: 0.6 % (ref 0.0–5.0)
Hemoglobin: 14.1 g/dL (ref 13.0–17.0)
Lymphocytes Relative: 19.9 % (ref 12.0–46.0)
MCHC: 33.4 g/dL (ref 30.0–36.0)
Monocytes Relative: 10.7 % (ref 3.0–12.0)
Neutro Abs: 3.2 10*3/uL (ref 1.4–7.7)
RBC: 4.78 Mil/uL (ref 4.22–5.81)
WBC: 4.7 10*3/uL (ref 4.5–10.5)

## 2011-03-01 LAB — LIPID PANEL
HDL: 38.4 mg/dL — ABNORMAL LOW (ref >39.00)
LDL Cholesterol: 52 mg/dL (ref 0–99)
Total CHOL/HDL Ratio: 3
Triglycerides: 54 mg/dL (ref 0.0–149.0)

## 2011-03-01 LAB — HEPATIC FUNCTION PANEL
Albumin: 3.6 g/dL (ref 3.5–5.2)
Bilirubin, Direct: 0.1 mg/dL (ref 0.0–0.3)
Total Protein: 7.1 g/dL (ref 6.0–8.3)

## 2011-03-01 NOTE — Assessment & Plan Note (Addendum)
Overall doing well, age appropriate education and counseling updated, referrals for preventative services and immunizations addressed, dietary and smoking counseling addressed, most recent labs and ECG reviewed.  I have personally reviewed and have noted: 1) the patient's medical and social history 2) The pt's use of alcohol, tobacco, and illicit drugs 3) The patient's current medications and supplements 4) Functional ability including ADL's, fall risk, home safety risk, hearing and visual impairment 5) Diet and physical activities 6) Evidence for depression or mood disorder 7) The patient's height, weight, and BMI have been recorded in the chart I have made referrals, and provided counseling and education based on review of the above Declines tetanus and pneumovax today, and ecg
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
## 2011-03-01 NOTE — Patient Instructions (Signed)
Continue all other medications as before, including the antibiotic (cipro) Please go to LAB in the [facility] for the blood and/or urine tests to be done today Please call the number on the Blue [facility] (the [employer] [facility]) for results of testing in 2-3 days You will be contacted regarding the referral for: Dr Ottelin/urology Please return in 6 mo

## 2011-03-01 NOTE — Assessment & Plan Note (Signed)
stable overall by hx and exam, most recent lab reviewed with pt, and pt to continue medical treatment as before , to check labs today

## 2011-03-01 NOTE — Assessment & Plan Note (Addendum)
Recurrent, pt requests referral back to Dr Ottelin/urology - has the copay now, clincially improved symptosm adn exam benign today - to finish current antibx

## 2011-03-05 ENCOUNTER — Other Ambulatory Visit: Payer: Self-pay | Admitting: Internal Medicine

## 2011-03-06 LAB — CBC
HCT: 40.9 % (ref 39.0–52.0)
MCV: 86.6 fL (ref 78.0–100.0)
Platelets: 135 10*3/uL — ABNORMAL LOW (ref 150–400)
RBC: 4.73 MIL/uL (ref 4.22–5.81)
WBC: 3.8 10*3/uL — ABNORMAL LOW (ref 4.0–10.5)

## 2011-03-06 LAB — COMPREHENSIVE METABOLIC PANEL
AST: 19 U/L (ref 0–37)
Albumin: 3.2 g/dL — ABNORMAL LOW (ref 3.5–5.2)
Alkaline Phosphatase: 44 U/L (ref 39–117)
CO2: 28 mEq/L (ref 19–32)
Chloride: 104 mEq/L (ref 96–112)
Creatinine, Ser: 1.34 mg/dL (ref 0.4–1.5)
GFR calc Af Amer: >60 mL/min (ref >60)
GFR calc non Af Amer: 52 mL/min — ABNORMAL LOW (ref >60)
Potassium: 3.7 mEq/L (ref 3.5–5.1)
Total Bilirubin: 0.7 mg/dL (ref 0.3–1.2)

## 2011-03-06 LAB — URINE MICROSCOPIC-ADD ON

## 2011-03-06 LAB — DIFFERENTIAL
Basophils Absolute: 0 10*3/uL (ref 0.0–0.1)
Basophils Relative: 0 % (ref 0–1)
Eosinophils Absolute: 0 10*3/uL (ref 0.0–0.7)
Eosinophils Relative: 1 % (ref 0–5)
Monocytes Absolute: 0.3 10*3/uL (ref 0.1–1.0)

## 2011-03-06 LAB — URINALYSIS, ROUTINE W REFLEX MICROSCOPIC
Bilirubin Urine: NEGATIVE
Ketones, ur: NEGATIVE mg/dL
Nitrite: NEGATIVE
Protein, ur: NEGATIVE mg/dL

## 2011-03-06 LAB — GLUCOSE, CAPILLARY: Glucose-Capillary: 130 mg/dL — ABNORMAL HIGH (ref 70–99)

## 2011-03-09 LAB — URINE CULTURE
Colony Count: NO GROWTH
Culture: NO GROWTH

## 2011-03-09 LAB — URINALYSIS, ROUTINE W REFLEX MICROSCOPIC
Ketones, ur: NEGATIVE mg/dL
Leukocytes, UA: NEGATIVE
Nitrite: NEGATIVE
Protein, ur: NEGATIVE mg/dL
pH: 5.5 (ref 5.0–8.0)

## 2011-03-09 LAB — COMPREHENSIVE METABOLIC PANEL
ALT: 24 U/L (ref 0–53)
Albumin: 3.3 g/dL — ABNORMAL LOW (ref 3.5–5.2)
Calcium: 9.4 mg/dL (ref 8.4–10.5)
GFR calc Af Amer: 54 mL/min — ABNORMAL LOW (ref >60)
Glucose, Bld: 221 mg/dL — ABNORMAL HIGH (ref 70–99)
Potassium: 4 mEq/L (ref 3.5–5.1)
Sodium: 141 mEq/L (ref 135–145)
Total Protein: 6.4 g/dL (ref 6.0–8.3)

## 2011-03-09 LAB — CBC
MCHC: 34 g/dL (ref 30.0–36.0)
Platelets: 143 10*3/uL — ABNORMAL LOW (ref 150–400)
RDW: 13.8 % (ref 11.5–15.5)

## 2011-03-09 LAB — POCT CARDIAC MARKERS
Myoglobin, poc: 282 ng/mL (ref 12–200)
Troponin i, poc: <0.05 ng/mL (ref 0.00–0.09)

## 2011-03-09 LAB — DIFFERENTIAL
Eosinophils Absolute: 0.1 10*3/uL (ref 0.0–0.7)
Lymphs Abs: 1 10*3/uL (ref 0.7–4.0)
Monocytes Absolute: 0.4 10*3/uL (ref 0.1–1.0)
Monocytes Relative: 9 % (ref 3–12)
Neutrophils Relative %: 68 % (ref 43–77)

## 2011-03-09 LAB — HEMOCCULT GUIAC POC 1CARD (OFFICE): Fecal Occult Bld: NEGATIVE

## 2011-03-15 LAB — POCT I-STAT, CHEM 8
BUN: 19 mg/dL (ref 6–23)
Calcium, Ion: 1.14 mmol/L (ref 1.12–1.32)
Chloride: 102 meq/L (ref 96–112)
Creatinine, Ser: 1.3 mg/dL (ref 0.4–1.5)
Glucose, Bld: 236 mg/dL — ABNORMAL HIGH (ref 70–99)
HCT: 49 % (ref 39.0–52.0)
Hemoglobin: 16.7 g/dL (ref 13.0–17.0)
Potassium: 3.6 meq/L (ref 3.5–5.1)
Sodium: 140 meq/L (ref 135–145)
TCO2: 28 mmol/L (ref 0–100)

## 2011-03-15 LAB — DIFFERENTIAL
Basophils Absolute: 0 10*3/uL (ref 0.0–0.1)
Lymphocytes Relative: 24 % (ref 12–46)
Neutro Abs: 2.7 10*3/uL (ref 1.7–7.7)
Neutrophils Relative %: 67 % (ref 43–77)

## 2011-03-15 LAB — GLUCOSE, CAPILLARY
Glucose-Capillary: 181 mg/dL — ABNORMAL HIGH (ref 70–99)
Glucose-Capillary: 206 mg/dL — ABNORMAL HIGH (ref 70–99)
Glucose-Capillary: 228 mg/dL — ABNORMAL HIGH (ref 70–99)
Glucose-Capillary: 236 mg/dL — ABNORMAL HIGH (ref 70–99)
Glucose-Capillary: 279 mg/dL — ABNORMAL HIGH (ref 70–99)

## 2011-03-15 LAB — URINE MICROSCOPIC-ADD ON

## 2011-03-15 LAB — HEPATIC FUNCTION PANEL
ALT: 26 U/L (ref 0–53)
Indirect Bilirubin: 0.7 mg/dL (ref 0.3–0.9)
Total Protein: 7.4 g/dL (ref 6.0–8.3)

## 2011-03-15 LAB — URINALYSIS, ROUTINE W REFLEX MICROSCOPIC
Glucose, UA: >1000 mg/dL — AB
Hgb urine dipstick: NEGATIVE
Protein, ur: 100 mg/dL — AB

## 2011-03-15 LAB — CBC
Platelets: 151 10*3/uL (ref 150–400)
RDW: 14.3 % (ref 11.5–15.5)

## 2011-03-15 LAB — RAPID URINE DRUG SCREEN, HOSP PERFORMED
Barbiturates: NOT DETECTED
Benzodiazepines: NOT DETECTED
Cocaine: NOT DETECTED
Opiates: NOT DETECTED

## 2011-03-15 LAB — ETHANOL

## 2011-03-16 LAB — CBC
MCHC: 33.6 g/dL (ref 30.0–36.0)
MCV: 88.5 fL (ref 78.0–100.0)
Platelets: 124 10*3/uL — ABNORMAL LOW (ref 150–400)
RBC: 4.8 MIL/uL (ref 4.22–5.81)
WBC: 4.4 10*3/uL (ref 4.0–10.5)

## 2011-03-16 LAB — COMPREHENSIVE METABOLIC PANEL
ALT: 17 U/L (ref 0–53)
AST: 15 U/L (ref 0–37)
Albumin: 3.6 g/dL (ref 3.5–5.2)
Calcium: 8.8 mg/dL (ref 8.4–10.5)
Creatinine, Ser: 1.46 mg/dL (ref 0.4–1.5)
GFR calc Af Amer: 57 mL/min — ABNORMAL LOW (ref >60)
Sodium: 137 mEq/L (ref 135–145)

## 2011-03-16 LAB — DIFFERENTIAL
Eosinophils Absolute: 0 10*3/uL (ref 0.0–0.7)
Eosinophils Relative: 1 % (ref 0–5)
Lymphocytes Relative: 21 % (ref 12–46)
Lymphs Abs: 0.9 10*3/uL (ref 0.7–4.0)
Monocytes Absolute: 0.4 10*3/uL (ref 0.1–1.0)
Monocytes Relative: 8 % (ref 3–12)

## 2011-03-16 LAB — GLUCOSE, CAPILLARY: Glucose-Capillary: 282 mg/dL — ABNORMAL HIGH (ref 70–99)

## 2011-03-19 ENCOUNTER — Emergency Department (HOSPITAL_COMMUNITY): Payer: Medicare Other

## 2011-03-19 ENCOUNTER — Emergency Department (HOSPITAL_COMMUNITY)
Admission: EM | Admit: 2011-03-19 | Discharge: 2011-03-19 | Disposition: A | Discharge status: Home / Self Care | Payer: Medicare Other | Attending: Emergency Medicine | Admitting: Emergency Medicine

## 2011-03-19 DIAGNOSIS — K219 Gastro-esophageal reflux disease without esophagitis: Secondary | ICD-10-CM | POA: Insufficient documentation

## 2011-03-19 DIAGNOSIS — Z950 Presence of cardiac pacemaker: Secondary | ICD-10-CM | POA: Insufficient documentation

## 2011-03-19 DIAGNOSIS — E785 Hyperlipidemia, unspecified: Secondary | ICD-10-CM | POA: Insufficient documentation

## 2011-03-19 DIAGNOSIS — I10 Essential (primary) hypertension: Secondary | ICD-10-CM | POA: Insufficient documentation

## 2011-03-19 DIAGNOSIS — E119 Type 2 diabetes mellitus without complications: Secondary | ICD-10-CM | POA: Insufficient documentation

## 2011-03-19 DIAGNOSIS — H5509 Other forms of nystagmus: Secondary | ICD-10-CM | POA: Insufficient documentation

## 2011-03-19 DIAGNOSIS — R51 Headache: Secondary | ICD-10-CM | POA: Insufficient documentation

## 2011-03-19 DIAGNOSIS — E039 Hypothyroidism, unspecified: Secondary | ICD-10-CM | POA: Insufficient documentation

## 2011-03-19 DIAGNOSIS — Z79899 Other long term (current) drug therapy: Secondary | ICD-10-CM | POA: Insufficient documentation

## 2011-03-19 DIAGNOSIS — R42 Dizziness and giddiness: Secondary | ICD-10-CM | POA: Insufficient documentation

## 2011-03-19 DIAGNOSIS — Z8546 Personal history of malignant neoplasm of prostate: Secondary | ICD-10-CM | POA: Insufficient documentation

## 2011-03-19 DIAGNOSIS — I252 Old myocardial infarction: Secondary | ICD-10-CM | POA: Insufficient documentation

## 2011-03-19 DIAGNOSIS — I2510 Atherosclerotic heart disease of native coronary artery without angina pectoris: Secondary | ICD-10-CM | POA: Insufficient documentation

## 2011-03-19 DIAGNOSIS — Z794 Long term (current) use of insulin: Secondary | ICD-10-CM | POA: Insufficient documentation

## 2011-03-19 LAB — BASIC METABOLIC PANEL
BUN: 14 mg/dL (ref 6–23)
Creatinine, Ser: 1.13 mg/dL (ref 0.4–1.5)
GFR calc non Af Amer: >60 mL/min (ref >60)
Glucose, Bld: 155 mg/dL — ABNORMAL HIGH (ref 70–99)
Potassium: 3.1 mEq/L — ABNORMAL LOW (ref 3.5–5.1)

## 2011-03-19 LAB — DIFFERENTIAL
Eosinophils Absolute: 0.1 10*3/uL (ref 0.0–0.7)
Eosinophils Relative: 3 % (ref 0–5)
Lymphocytes Relative: 37 % (ref 12–46)
Lymphs Abs: 1.2 10*3/uL (ref 0.7–4.0)
Monocytes Absolute: 0.3 10*3/uL (ref 0.1–1.0)
Monocytes Relative: 9 % (ref 3–12)

## 2011-03-19 LAB — CBC
HCT: 40 % (ref 39.0–52.0)
MCH: 28.1 pg (ref 26.0–34.0)
MCV: 83.2 fL (ref 78.0–100.0)
RDW: 13.3 % (ref 11.5–15.5)
WBC: 3.3 10*3/uL — ABNORMAL LOW (ref 4.0–10.5)

## 2011-03-22 ENCOUNTER — Emergency Department (HOSPITAL_COMMUNITY)
Admission: EM | Admit: 2011-03-22 | Discharge: 2011-03-22 | Disposition: A | Discharge status: Home / Self Care | Payer: Medicare Other | Attending: Emergency Medicine | Admitting: Emergency Medicine

## 2011-03-22 DIAGNOSIS — E119 Type 2 diabetes mellitus without complications: Secondary | ICD-10-CM | POA: Insufficient documentation

## 2011-03-22 DIAGNOSIS — I252 Old myocardial infarction: Secondary | ICD-10-CM | POA: Insufficient documentation

## 2011-03-22 DIAGNOSIS — Z8546 Personal history of malignant neoplasm of prostate: Secondary | ICD-10-CM | POA: Insufficient documentation

## 2011-03-22 DIAGNOSIS — N390 Urinary tract infection, site not specified: Secondary | ICD-10-CM | POA: Insufficient documentation

## 2011-03-22 DIAGNOSIS — E860 Dehydration: Secondary | ICD-10-CM | POA: Insufficient documentation

## 2011-03-22 DIAGNOSIS — I10 Essential (primary) hypertension: Secondary | ICD-10-CM | POA: Insufficient documentation

## 2011-03-22 DIAGNOSIS — N289 Disorder of kidney and ureter, unspecified: Secondary | ICD-10-CM | POA: Insufficient documentation

## 2011-03-22 DIAGNOSIS — S88119A Complete traumatic amputation at level between knee and ankle, unspecified lower leg, initial encounter: Secondary | ICD-10-CM | POA: Insufficient documentation

## 2011-03-22 DIAGNOSIS — Z950 Presence of cardiac pacemaker: Secondary | ICD-10-CM | POA: Insufficient documentation

## 2011-03-22 DIAGNOSIS — R42 Dizziness and giddiness: Secondary | ICD-10-CM | POA: Insufficient documentation

## 2011-03-22 DIAGNOSIS — I2510 Atherosclerotic heart disease of native coronary artery without angina pectoris: Secondary | ICD-10-CM | POA: Insufficient documentation

## 2011-03-22 LAB — URINALYSIS, ROUTINE W REFLEX MICROSCOPIC
Glucose, UA: 500 mg/dL — AB
Ketones, ur: NEGATIVE mg/dL
Protein, ur: NEGATIVE mg/dL

## 2011-03-22 LAB — POCT I-STAT, CHEM 8
BUN: 34 mg/dL — ABNORMAL HIGH (ref 6–23)
Calcium, Ion: 1.17 mmol/L (ref 1.12–1.32)
Creatinine, Ser: 1.6 mg/dL — ABNORMAL HIGH (ref 0.4–1.5)
Hemoglobin: 15.3 g/dL (ref 13.0–17.0)
TCO2: 30 mmol/L (ref 0–100)

## 2011-03-23 LAB — URINE CULTURE
Culture  Setup Time: 201204240140
Culture: NO GROWTH

## 2011-03-24 ENCOUNTER — Ambulatory Visit (INDEPENDENT_AMBULATORY_CARE_PROVIDER_SITE_OTHER): Payer: Medicare Other | Admitting: Internal Medicine

## 2011-03-24 ENCOUNTER — Emergency Department (HOSPITAL_COMMUNITY): Payer: Medicare Other

## 2011-03-24 ENCOUNTER — Encounter: Payer: Self-pay | Admitting: Internal Medicine

## 2011-03-24 ENCOUNTER — Emergency Department (HOSPITAL_COMMUNITY)
Admission: EM | Admit: 2011-03-24 | Discharge: 2011-03-24 | Disposition: A | Discharge status: Home / Self Care | Payer: Medicare Other | Attending: Emergency Medicine | Admitting: Emergency Medicine

## 2011-03-24 DIAGNOSIS — Z0000 Encounter for general adult medical examination without abnormal findings: Secondary | ICD-10-CM

## 2011-03-24 DIAGNOSIS — I2510 Atherosclerotic heart disease of native coronary artery without angina pectoris: Secondary | ICD-10-CM | POA: Insufficient documentation

## 2011-03-24 DIAGNOSIS — N400 Enlarged prostate without lower urinary tract symptoms: Secondary | ICD-10-CM

## 2011-03-24 DIAGNOSIS — R0789 Other chest pain: Secondary | ICD-10-CM | POA: Insufficient documentation

## 2011-03-24 DIAGNOSIS — E119 Type 2 diabetes mellitus without complications: Secondary | ICD-10-CM

## 2011-03-24 DIAGNOSIS — Z8546 Personal history of malignant neoplasm of prostate: Secondary | ICD-10-CM | POA: Insufficient documentation

## 2011-03-24 DIAGNOSIS — E785 Hyperlipidemia, unspecified: Secondary | ICD-10-CM | POA: Insufficient documentation

## 2011-03-24 DIAGNOSIS — Z95810 Presence of automatic (implantable) cardiac defibrillator: Secondary | ICD-10-CM | POA: Insufficient documentation

## 2011-03-24 DIAGNOSIS — I10 Essential (primary) hypertension: Secondary | ICD-10-CM

## 2011-03-24 DIAGNOSIS — N390 Urinary tract infection, site not specified: Secondary | ICD-10-CM

## 2011-03-24 DIAGNOSIS — E039 Hypothyroidism, unspecified: Secondary | ICD-10-CM | POA: Insufficient documentation

## 2011-03-24 DIAGNOSIS — I252 Old myocardial infarction: Secondary | ICD-10-CM | POA: Insufficient documentation

## 2011-03-24 LAB — CBC
HCT: 39.4 % (ref 39.0–52.0)
Hemoglobin: 13.1 g/dL (ref 13.0–17.0)
RBC: 4.71 MIL/uL (ref 4.22–5.81)
WBC: 3.6 10*3/uL — ABNORMAL LOW (ref 4.0–10.5)

## 2011-03-24 LAB — DIFFERENTIAL
Basophils Absolute: 0 10*3/uL (ref 0.0–0.1)
Basophils Relative: 0 % (ref 0–1)
Eosinophils Absolute: 0.1 10*3/uL (ref 0.0–0.7)
Eosinophils Relative: 2 % (ref 0–5)

## 2011-03-24 LAB — BASIC METABOLIC PANEL
Calcium: 8.3 mg/dL — ABNORMAL LOW (ref 8.4–10.5)
GFR calc Af Amer: 55 mL/min — ABNORMAL LOW (ref >60)
GFR calc non Af Amer: 46 mL/min — ABNORMAL LOW (ref >60)
Sodium: 140 mEq/L (ref 135–145)

## 2011-03-24 LAB — POCT CARDIAC MARKERS
CKMB, poc: 3 ng/mL (ref 1.0–8.0)
Myoglobin, poc: 147 ng/mL (ref 12–200)
Troponin i, poc: <0.05 ng/mL (ref 0.00–0.09)
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
## 2011-03-24 NOTE — Assessment & Plan Note (Signed)
Most recent cx neg, he will simply finish the next 2 days cipro,  to f/u any worsening symptoms or concerns

## 2011-03-24 NOTE — Patient Instructions (Signed)
Continue all other medications as before - and finish the cipro as you have Please keep your appointments with your specialists as you have planned Please return in 6 mo with Lab testing done 3-5 days before, if possible

## 2011-03-24 NOTE — Assessment & Plan Note (Signed)
Has some ongoing prostatism adn possible OAB symtpoms, but declines further tx for now until sees urology in [facility]

## 2011-03-24 NOTE — Assessment & Plan Note (Signed)
Recent poor control now improved per pt - Continue all other medications as before, and diet,  to f/u any worsening symptoms or concerns

## 2011-03-24 NOTE — Assessment & Plan Note (Signed)
stable overall by hx and exam, most recent lab reviewed with pt, and pt to continue medical treatment as before  BP Readings from Last 3 Encounters:  03/24/11 116/62  03/01/11 110/60  12/24/10 120/68

## 2011-03-25 ENCOUNTER — Telehealth: Payer: Self-pay | Admitting: Internal Medicine

## 2011-03-25 ENCOUNTER — Ambulatory Visit (INDEPENDENT_AMBULATORY_CARE_PROVIDER_SITE_OTHER): Payer: Medicare Other | Admitting: *Deleted

## 2011-03-25 ENCOUNTER — Encounter: Payer: Medicare Other | Admitting: *Deleted

## 2011-03-25 DIAGNOSIS — I5022 Chronic systolic (congestive) heart failure: Secondary | ICD-10-CM

## 2011-03-25 NOTE — Telephone Encounter (Signed)
Pt went to er on yesterday , c/o  swelling around pacer site. Was advise to call the office today.

## 2011-03-25 NOTE — Telephone Encounter (Signed)
Patient was seen in ED for swelling @ device site.   He will be seen here in the office today @ 3:30pm

## 2011-03-26 ENCOUNTER — Telehealth: Payer: Self-pay | Admitting: Internal Medicine

## 2011-03-26 NOTE — Telephone Encounter (Signed)
Pt came in yesterday for an appt. But Dr. Ladona Ridgel not in.  Next appt 5-23 but ER doctor told him he needed to be seen as soon as possible because of pacemaker.

## 2011-03-26 NOTE — Telephone Encounter (Signed)
Pt calling stating he needs to see dr taylor about his device--pt did come in 03/25/11 and dr taylor saw his wound site and advised he would watch site and pt should keep may appoint--i advised pt of the above, but he states "THAT'S NOT THE RIGHT WAY"--pt was also seen by pacer clinic at this time--I advised pt if he doesn't  agree he may go to nearest ED-pt agrees--nt

## 2011-03-30 ENCOUNTER — Other Ambulatory Visit: Payer: Self-pay | Admitting: Internal Medicine

## 2011-04-06 ENCOUNTER — Emergency Department (HOSPITAL_COMMUNITY): Payer: Medicare Other

## 2011-04-06 ENCOUNTER — Inpatient Hospital Stay (HOSPITAL_COMMUNITY)
Admission: EM | Admit: 2011-04-06 | Discharge: 2011-04-08 | DRG: 293 | Disposition: A | Discharge status: Home / Self Care | Payer: Medicare Other | Attending: Internal Medicine | Admitting: Internal Medicine

## 2011-04-06 DIAGNOSIS — I5033 Acute on chronic diastolic (congestive) heart failure: Principal | ICD-10-CM | POA: Diagnosis present

## 2011-04-06 DIAGNOSIS — K219 Gastro-esophageal reflux disease without esophagitis: Secondary | ICD-10-CM | POA: Diagnosis present

## 2011-04-06 DIAGNOSIS — E1149 Type 2 diabetes mellitus with other diabetic neurological complication: Secondary | ICD-10-CM | POA: Diagnosis present

## 2011-04-06 DIAGNOSIS — I2589 Other forms of chronic ischemic heart disease: Secondary | ICD-10-CM | POA: Diagnosis present

## 2011-04-06 DIAGNOSIS — R0602 Shortness of breath: Secondary | ICD-10-CM

## 2011-04-06 DIAGNOSIS — I509 Heart failure, unspecified: Secondary | ICD-10-CM | POA: Diagnosis present

## 2011-04-06 DIAGNOSIS — N319 Neuromuscular dysfunction of bladder, unspecified: Secondary | ICD-10-CM | POA: Diagnosis present

## 2011-04-06 DIAGNOSIS — E876 Hypokalemia: Secondary | ICD-10-CM | POA: Diagnosis present

## 2011-04-06 DIAGNOSIS — I959 Hypotension, unspecified: Secondary | ICD-10-CM | POA: Diagnosis present

## 2011-04-06 DIAGNOSIS — Z95810 Presence of automatic (implantable) cardiac defibrillator: Secondary | ICD-10-CM

## 2011-04-06 DIAGNOSIS — R079 Chest pain, unspecified: Secondary | ICD-10-CM

## 2011-04-06 DIAGNOSIS — I2510 Atherosclerotic heart disease of native coronary artery without angina pectoris: Secondary | ICD-10-CM | POA: Diagnosis present

## 2011-04-06 DIAGNOSIS — S88119A Complete traumatic amputation at level between knee and ankle, unspecified lower leg, initial encounter: Secondary | ICD-10-CM

## 2011-04-06 DIAGNOSIS — Z7982 Long term (current) use of aspirin: Secondary | ICD-10-CM

## 2011-04-06 DIAGNOSIS — I10 Essential (primary) hypertension: Secondary | ICD-10-CM | POA: Diagnosis present

## 2011-04-06 DIAGNOSIS — E039 Hypothyroidism, unspecified: Secondary | ICD-10-CM | POA: Diagnosis present

## 2011-04-06 DIAGNOSIS — Z794 Long term (current) use of insulin: Secondary | ICD-10-CM

## 2011-04-06 LAB — DIFFERENTIAL
Basophils Absolute: 0 10*3/uL (ref 0.0–0.1)
Basophils Relative: 0 % (ref 0–1)
Eosinophils Relative: 2 % (ref 0–5)
Monocytes Absolute: 0.4 10*3/uL (ref 0.1–1.0)
Monocytes Relative: 10 % (ref 3–12)

## 2011-04-06 LAB — CBC
HCT: 39.3 % (ref 39.0–52.0)
Hemoglobin: 12.8 g/dL — ABNORMAL LOW (ref 13.0–17.0)
MCH: 27.7 pg (ref 26.0–34.0)
MCH: 27 pg (ref 26.0–34.0)
MCHC: 32.8 g/dL (ref 30.0–36.0)
MCHC: 32.1 g/dL (ref 30.0–36.0)
Platelets: 104 10*3/uL — ABNORMAL LOW (ref 150–400)
RDW: 13.8 % (ref 11.5–15.5)
RDW: 13.6 % (ref 11.5–15.5)

## 2011-04-06 LAB — BASIC METABOLIC PANEL
Calcium: 9.4 mg/dL (ref 8.4–10.5)
Creatinine, Ser: 1.61 mg/dL — ABNORMAL HIGH (ref 0.4–1.5)
GFR calc Af Amer: 50 mL/min — ABNORMAL LOW (ref >60)
GFR calc non Af Amer: 42 mL/min — ABNORMAL LOW (ref >60)
Glucose, Bld: 336 mg/dL — ABNORMAL HIGH (ref 70–99)
Sodium: 136 mEq/L (ref 135–145)

## 2011-04-06 LAB — POCT CARDIAC MARKERS
CKMB, poc: 2 ng/mL (ref 1.0–8.0)
Myoglobin, poc: 119 ng/mL (ref 12–200)
Myoglobin, poc: 144 ng/mL (ref 12–200)

## 2011-04-06 LAB — CARDIAC PANEL(CRET KIN+CKTOT+MB+TROPI)
CK, MB: 5.3 ng/mL — ABNORMAL HIGH (ref 0.3–4.0)
Relative Index: 3.2 — ABNORMAL HIGH (ref 0.0–2.5)
Total CK: 168 U/L (ref 7–232)
Total CK: 165 U/L (ref 7–232)
Troponin I: <0.3 ng/mL (ref <0.30)
Troponin I: <0.3 ng/mL (ref <0.30)

## 2011-04-06 LAB — CK TOTAL AND CKMB (NOT AT ARMC)
CK, MB: 6 ng/mL — ABNORMAL HIGH (ref 0.3–4.0)
Total CK: 178 U/L (ref 7–232)

## 2011-04-06 LAB — GLUCOSE, CAPILLARY
Glucose-Capillary: 142 mg/dL — ABNORMAL HIGH (ref 70–99)
Glucose-Capillary: 83 mg/dL (ref 70–99)
Glucose-Capillary: 166 mg/dL — ABNORMAL HIGH (ref 70–99)

## 2011-04-06 LAB — TROPONIN I: Troponin I: <0.3 ng/mL (ref <0.30)

## 2011-04-06 LAB — MRSA PCR SCREENING: MRSA by PCR: NEGATIVE

## 2011-04-06 NOTE — H&P (Signed)
NAMEVIRGLE, [name]                  ACCOUNT NO.:  [id number]  MEDICAL RECORD NO.:  [id number]           PATIENT TYPE:  E  LOCATION:  [facility]                         FACILITY:  [facility]  PHYSICIAN:  Zacarias Pontes, MD       DATE OF BIRTH:  1929-04-05  DATE OF ADMISSION:  04/06/2011 DATE OF DISCHARGE:                             HISTORY & PHYSICAL   PRIMARY CARDIOLOGIST:  Doylene Canning. Ladona Ridgel, MD  PRIMARY CARE PHYSICIAN:  Corwin Levins, MD  PATIENT LOCATION:  Evaluated in the emergency room.  CHIEF COMPLAINT:  Shortness of breath and chest pain.  HISTORY OF PRESENT ILLNESS:  [name] is an 77 year old gentleman with a longstanding ischemic cardiomyopathy, type 2 diabetes mellitus, chronic kidney disease, and several other medical comorbidities who presents with shortness of breath, together with chest pain.  He reports 2-3 days of increased shortness of breath and orthopnea with episodic left chest tightness and pain.  He has had progressive difficulty sleeping comfortably on account of shortness of breath and his exertional tolerance has been significantly diminished.  Over the course of the last 2-3 days, he additionally experienced left-sided chest tightness, which he reports as constant and unrelated to exertion.  These complaints are in addition to left chest discomfort and pain over the last several months in and around around the site of his pacemaker generator.  His pacemaker and ICD generator was last replaced a few months ago and since then, he has felt discomfort and has reported swelling chronically in that area.  Of note, he denies fevers or any drainage from that site.  Though he has a scheduled appointment to see his cardiologist later in the month, he felt it was necessary to present for a more urgent evaluation in the emergency room this evening.  Based on the constellation of his symptoms, he is admitted for further evaluation and management.  PAST MEDICAL HISTORY: 1.  Ischemic cardiomyopathy with an echocardiogram in October 2009     demonstrating an ejection fraction of 60% with moderate left     ventricular hypertrophy and mild mitral regurgitation. 2. Coronary artery disease with catheterization in 2007 demonstrating     multivessel disease and prior PCIs. 3. Congestive heart failure. 4. Type 2 diabetes mellitus, requiring insulin. 5. Hypertension. 6. Hypothyroidism. 7. Right BKA. 8. Left Charcot foot. 9. Neurogenic bladder secondary to diabetes mellitus. 10.Gastroesophageal reflux disease. 11.History of pneumonia, requiring hospitalization approximately 1     year ago.  SOCIAL HISTORY:  He lives alone and is a nonsmoker.  He denies alcohol or drug use.  FAMILY HISTORY:  Noncontributory to this presentation.  REVIEW OF SYSTEMS:  As per HPI, otherwise is comprehensively negative.  ALLERGIES:  PERCOCET.  MEDICATIONS: 1. Aspirin 325 mg daily. 2. Clopidogrel 75 mg daily. 3. Isosorbide dinitrate 30 mg p.o. b.i.d. 4. Simvastatin 80 mg daily. 5. Klor-Con 20 mEq daily. 6. Coreg 25 mg p.o. b.i.d. 7. Lasix 40 mg p.o. daily. 8. Synthroid 150 mcg p.o. daily. 9. Avapro 300 mg daily. 10.Prilosec 20 mg daily. 11.Macrodantin 100 mg daily. 12.Avodart 0.5 mg daily. 13.Lantus insulin, dose is  not specified.  PHYSICAL EXAMINATION:  Comprehensive cardiovascular exam was performed. VITAL SIGNS:  The patient's blood pressure is 147/75 with a heart rate of 65.  He is breathing comfortably at a respiratory rate of 15, satting 100% on 2 liters nasal cannula. GENERAL:  He is alert and oriented x3 and is in no acute distress.  He does not appear to be using accessory muscles for breathing. HEENT:  Notable for a supple neck with no masses or lymphadenopathy. His JVP exam is challenging given his body habitus and I cannot accurately pinpoint the level of his JVP on exam. CARDIAC:  Notable for normal S1 and S2 with no murmurs, rubs, or gallops.  Heart sounds  are somewhat distant. LUNGS:  Notable for lungs that are clear to auscultation bilaterally with some scattered bibasilar crackles. ABDOMEN:  Obese but otherwise soft, nontender, mildly distended with positive bowel sounds and no appreciated bruits. EXTREMITIES:  Notable for a right BKA with no open wounds or sores.  The left lower extremity is notable for 1+ pitting lower extremity edema. NEUROLOGICAL:  He is alert and oriented x3 with no focal neurologic deficits detected on exam.  LABORATORY EVALUATION:  His white count is 3000, hematocrit 39, platelets 101,000.  His sodium is 136, potassium 3.6, chloride 100, bicarb 30, BUN 22, creatinine 1.6 with an elevated glucose of 336.  His pro BNP is elevated at 916.  Initial set of cardiac enzymes is negative.  His ECG shows an RV-paced rhythm.  Chest x-ray shows no focal infiltrates or evidence of frank pulmonary edema.  IMPRESSION:  This is an 77 year old gentleman with an ischemic cardiomyopathy and coronary artery disease as well as a permanent pacemaker and AICD generator replacement a few months ago who presents with symptoms and exam suggestive of volume overload and congestive heart failure exacerbation.  Though his V-paced ECG is not a helpful guide in assessing for ischmeia, I have a low suspicion for acute coronary syndrome and instead think this is more a volume-driven process.  PLAN:  Based on the above impression, we will admit [name] so that he can complete an enzymathatic rule out.  Given my low suspicion for an acute coronary syndrome, I will not initiate systemic anticoagulation, but will continue him on his chronic dual-antiplatelet therapy.  His symptoms over the last several days highlighted by his shortness of breath and his orthopnea, have more to do with what I think is a volume driven process.  We will initiate intravenous diuresis with close monitoring of his electrolytes and renal function given his  admission BUN of 22 and his creatinine of 1.6.  After a single dose of IV diuretic in the emergency room, he reports his breathing improved.  I do think he still has excess volume and that further optimization in the hospital is warranted.  From the standpoint of his soreness and tightness in and around the site of his pacemaker generator, I see no evidence of acute pathology in this area.  There is no evident cellulitis or even any evident swelling in the area. Skin is intact with no evidence of wound compromise. No further imagine testing is necessary on this front.   Looking back through his medical records, it appears that [name] has longstanding insulin-dependent diabetes with variable dosing regimens instituted in the past.  His admission glucose of 336 is clearly in need of better control, and we will initiate both sliding scale coverage as well as a standing dose of long-acting insulin  in the form of Lantus 20 units per day.  Further adjustments can be made accordingly.  He would benefit from additional diabetic teaching.  I explained the plan to the patient and tried to answers his questions to the best of my ability.          ______________________________ Zacarias Pontes, MD     DM/MEDQ  D:  04/06/2011  T:  04/06/2011  Job:  [id number]  Electronically Signed by Zacarias Pontes MD on 04/06/2011 04:24:44 PM

## 2011-04-07 DIAGNOSIS — I5031 Acute diastolic (congestive) heart failure: Secondary | ICD-10-CM

## 2011-04-07 LAB — COMPREHENSIVE METABOLIC PANEL
ALT: 16 U/L (ref 0–53)
Albumin: 3.3 g/dL — ABNORMAL LOW (ref 3.5–5.2)
Alkaline Phosphatase: 59 U/L (ref 39–117)
Calcium: 9.2 mg/dL (ref 8.4–10.5)
Glucose, Bld: 132 mg/dL — ABNORMAL HIGH (ref 70–99)
Potassium: 3.2 mEq/L — ABNORMAL LOW (ref 3.5–5.1)
Sodium: 138 mEq/L (ref 135–145)
Total Protein: 6.9 g/dL (ref 6.0–8.3)

## 2011-04-07 LAB — CBC
HCT: 42.8 % (ref 39.0–52.0)
MCHC: 33.2 g/dL (ref 30.0–36.0)
Platelets: 103 10*3/uL — ABNORMAL LOW (ref 150–400)
RDW: 13.5 % (ref 11.5–15.5)
WBC: 4.4 10*3/uL (ref 4.0–10.5)

## 2011-04-07 LAB — MAGNESIUM: Magnesium: 2.1 mg/dL (ref 1.5–2.5)

## 2011-04-07 LAB — GLUCOSE, CAPILLARY
Glucose-Capillary: 85 mg/dL (ref 70–99)
Glucose-Capillary: 165 mg/dL — ABNORMAL HIGH (ref 70–99)
Glucose-Capillary: 156 mg/dL — ABNORMAL HIGH (ref 70–99)

## 2011-04-07 LAB — PHOSPHORUS: Phosphorus: 3.2 mg/dL (ref 2.3–4.6)

## 2011-04-08 DIAGNOSIS — I5033 Acute on chronic diastolic (congestive) heart failure: Secondary | ICD-10-CM

## 2011-04-08 LAB — BASIC METABOLIC PANEL
BUN: 20 mg/dL (ref 6–23)
CO2: 34 mEq/L — ABNORMAL HIGH (ref 19–32)
Calcium: 8.8 mg/dL (ref 8.4–10.5)
Creatinine, Ser: 1.26 mg/dL (ref 0.4–1.5)
Glucose, Bld: 136 mg/dL — ABNORMAL HIGH (ref 70–99)
Sodium: 138 mEq/L (ref 135–145)

## 2011-04-13 NOTE — Letter (Signed)
August 30, 2007   [name], M.D.  [address]  [address]  [place], [place]  [address]   Re:  [name], [name]              DOB:  Aug 24, 1929   Dear [name],   I recently did an arteriogram on [name].  Unfortunately, he has  unreconstructable arterial occlusive disease in his left lower  extremity.  Hopefully, the wound will not progress in his left first  metatarsal area overtime.  Hopefully, local wound care will either help  this to heal spontaneously or prevent any further progression.  If the  wound does progress over time, becomes infected or has extreme pain, the  only option for him would be an amputation.   Please let me know if I can be of further assistance in his care.   Sincerely.   [name]. Fields, MD  Electronically Signed   CEF/MEDQ  D:  08/30/2007  T:  08/31/2007  Job:  [id number]

## 2011-04-13 NOTE — H&P (Signed)
HISTORY AND PHYSICAL EXAMINATION   August 16, 2007   Re:  [name], BELLEW D                  DOB:  1929-08-10   The patient is a 77 year old male [facility] Mills [facility]  He has a 1 month history of a nonhealing ulcer  over his left first metatarsal head.  He has a satellite ulcer next to  this.  This has come up recently.   PAST MEDICAL HISTORY:  Significant for diabetes, coronary artery  disease, previous stroke, AICD placement, he has previously had a right  below-knee amputation by Dr. Lurene Shadow and amputation of toes 2-5 of the  left foot approximately 6 years ago.  He currently has no rest pain in  the left foot.  He does have a history of neuropathy.  His cardiologists  are Dr. Tonny Bollman and Dr. Lewayne Bunting with [facility].  He had a myocardial infarction in 1995.  He has a history of  hypertension, elevated cholesterol, congestive heart failure, and  diabetes.   PAST SURGICAL HISTORY:  As listed above.   FAMILY HISTORY:  Remarkable for diabetes in his mother and his father  had congestive failure.   SOCIAL HISTORY:  He is single and has 2 children.  He is a nonsmoker,  nonconsumer of alcohol.   REVIEW OF SYSTEMS:  He is 6 foot 2 inches, 267 pounds.  He denies recent  chest pain.  He has no history of COPD, GI bleeding, DVT or syncopal  episodes.  He does have some decreased visual acuity as well as multiple  joint and arthritis pain.  He had a stroke in 2005 which left him with  some left arm residual numbness.  He denies any history of renal  insufficiency.   MEDICATIONS:  1. Aspirin 325 mg daily.  2. Coreg 25 mg twice daily.  3. Avapro 300 mg daily.  4. Lipitor 40 mg nightly.  5. Protonix 40 mg daily.  6. Imdur 120 mg daily.  7. Glipizide XL 10 mg daily.  8. Synthroid 125 mcg daily.  9. Lantus insulin 30 units in the morning.  10.He was recently on Augmentin for his foot ulcer.   ALLERGIES:  HE  HAS ADVERSE REACTIONS OF NAUSEA AND VOMITING TO PERCOCET  AND PERCODAN.   PHYSICAL EXAMINATION:  Vital signs:  Blood pressure is 102/66 in the  left arm, heart rate 65 and regular.  HEENT:  Unremarkable.  Neck:  He  has 2+ carotid pulses without bruit.  Chest:  Clear to auscultation.  Cardiac:  Regular rate and rhythm without murmur.  Extremities:  He has  1+ radial pulses bilaterally.  He has 1+ femoral pulses bilaterally.  He  has a well-healed right below-knee amputation.  He has no palpable  popliteal or pedal pulses in the left foot.  Toes 2-5 have been  amputated in the left foot.  He has some Charcot changes to the right  first toe.  There are 2 metatarsal ulcers on the plantar aspect of left  foot over the first metatarsal head.  These have a pale base.  There is  minimal evidence of granulation.  There is no purulent drainage.  He  currently is wearing an off loading shoe.  He had recent ABIs performed  by Dr. Tanda Rockers at the [facility]; these were 0.77 on the left  side.   I agree with Dr.  Tanda Rockers that the patient has an ulcer which is  multifactorial in origin.  It is primarily a neuropathic ulcer but he  may also have some component of venous insufficiency which has  contributed to this over time.  He does have some chronic stasis changes  in his left lower extremity.   I believe the best management is as you are currently doing, with off  loading the left foot hopefully these ulcers will heal.  However I do  believe his ABIs are low enough that we should consider arteriography as  he may have some calcification that would mask more severe lower  extremity occlusive disease.  We have scheduled this for August 25, 2007.  If at that time he has a lower extremity problem which is  amenable to percutaneous treatment with an angioplasty or stent we would  also consider that at that time.  If it would require a major bypass we  will probably continue to observe the  wound for a while to see if would  heal spontaneously.  I  discussed all of this as well as the risks, benefits, and possible  complications of the procedure with the patient today.  We will proceed  with arteriography on August 25, 2007.   [name]. Fields, MD  Electronically Signed   CEF/MEDQ  [name]:  08/16/2007  T:  08/17/2007  Job:  [id number]   cc:   Jake Shark A. Tanda Rockers, M.[name].  Corwin Levins, MD

## 2011-04-13 NOTE — Assessment & Plan Note (Signed)
[facility]                         ELECTROPHYSIOLOGY OFFICE NOTE   NAME:[name], [name] MINOTTI                         MRN:          [id number]  DATE:07/30/2008                            DOB:          07-02-1929    [name] returns today for followup.  He is a very pleasant man with an  ischemic cardiomyopathy and congestive heart failure, who also has  psychiatric problems with a multitude of delusions.  The patient today  remains convinced that the government is trying to get him and that they  are watching him.  Despite this, he denies chest pain and denies  shortness of breath.  He does spend most of his time in a wheelchair  which is motorized.   MEDICATIONS:  1. Omeprazole 20 mg 2 tablets daily.  2. Simvastatin 80 mg daily.  3. Avapro 300 a day.  4. Furosemide 40 a day.  5. carvedilol 25 twice a day.  6. Glipizide 10 a day.  7. Plavix 75 a day.  8. Potassium 20 mEq daily.  9. Lantus insulin.  10.Imdur 60 mg daily.   PHYSICAL EXAMINATION:  GENERAL:  He is a pleasant, but clearly  delusional man in no distress.  VITAL SIGNS:  Blood pressure was 85/50, the pulse 60 and regular, and  respirations were 18.  NECK:  No jugular venous distention.  LUNGS:  Clear bilaterally to auscultation.  No wheezes, rales, or  rhonchi are present.  CARDIOVASCULAR:  Regular rate and rhythm.  Normal S1 and S2.  No  murmurs, rubs, or gallops.  EXTREMITIES:  No edema.   Interrogation of his defibrillator demonstrates [employer].  The  P and R waves were 2 and 12 respectively.  The impedance 608 in the A,  520 in the RV, 664 in the LV.  The threshold was 1.4 volts in the atrium  and the right ventricle and 2.5 in the left ventricle.  The battery  voltage was 3 volts.  Underlying rhythm was sinus at 65.   IMPRESSION:  1. Ischemic cardiomyopathy.  2. Congestive heart failure.  3. Status post biventricular implantable cardioverter-defibrillator  insertion.   DISCUSSION:  Overall, [name] is stable.  His defibrillator is working  normally.  We will plan on seeing him back for ICD followup in 1 year.     Doylene Canning. Ladona Ridgel, MD  Electronically Signed    GWT/MedQ  DD: 07/30/2008  DT: 07/31/2008  Job #: [id number]

## 2011-04-13 NOTE — Assessment & Plan Note (Signed)
[facility]   NAME:  KRAMER, HANRAHAN                  ACCOUNT NO.:  [id number]   MEDICAL RECORD NO.:  [id number]      DATE OF BIRTH:  August 01, 1929   PHYSICIAN:  Theresia Majors. Tanda Rockers, M.D. VISIT DATE:  08/30/2007                                   OFFICE VISIT   SUBJECTIVE:  Mr. Brandl is a 77 year old man who we are following for  Wagner II diabetic foot ulcer involving the left hallux.  In the  interim, he has been evaluated by Dr. Fabienne Bruns and has undergone  an arteriogram which has demonstrated severe occlusive disease at the  tibial level and has been deemed non-reconstructable.  The patient  continues to complain of scant drainage from the toe, but he has had no  fever and no excessive pain.  He continues to dress the wound with a  white sock and use antiseptic soap.  He was seen in the emergency room  earlier today complaining of weakness.   OBJECTIVE:  His blood pressure is 87/72, respirations 16, pulse rate 64,  temperature 97.7.  Inspection of the left lower extremity shows that the  ulcer has deteriorated and now extends into the periosteum.  We have  cultured this wound and we have increased the classification to a Wagner  3.   ASSESSMENT:  A Wagner grade 3 diabetic foot ulcer.   PLAN:  The patient appears to be lethargic during this exam.  We have  ascertained that he is driving himself to and from the clinic.  We have  insisted that he be seen in the emergency room prior to being allowed to  drive his car; he is technically hypotensive in the clinic.   Pending the outcome of his ER evaluation, he may be permitted to return  home or his primary care physician will be notified.  We will recommend  that we proceed with screening for the possibility of utilizing  hyperbaric oxygen in an attempt to get the [facility] grade 3 diabetic foot  ulcer to heal.  We will review this disposition again pending the  patient's emergency room evaluation.  The patient was  transported to the  emergency room by the wound care staff in stable condition.      Harold A. Tanda Rockers, M.D.  Electronically Signed     HAN/MEDQ  D:  08/30/2007  T:  08/31/2007  Job:  [id number]

## 2011-04-13 NOTE — H&P (Signed)
[name], [name]                  ACCOUNT NO.:  [id number]   MEDICAL RECORD NO.:  [id number]          PATIENT TYPE:  INP   LOCATION:  1536                         FACILITY:  [facility]   PHYSICIAN:  Deirdre Peer. Polite, M.D. DATE OF BIRTH:  12/11/1928   DATE OF ADMISSION:  07/22/2007  DATE OF DISCHARGE:                              HISTORY & PHYSICAL   CHIEF COMPLAINT:  Left foot ulcer with drainage.   HISTORY OF PRESENT ILLNESS:  A 77 year old male with multiple medical  problems came to the ED after his foot was looked at by a friend and it  appeared to have worsening drainage.  Patient denied any fever or  chills.  He did state that he came to the ED about a week ago and was  told he had an ulcer, had been put on antibiotics. Review of the E-chart  shows he had been treated with Bactrim and had an x-ray at that time  without any signs of osteo.  Patient has not seen his primary M.D.  since, however, he states he does have an appointment coming up in the  next week or so.  Prior to a week or so ago when he came to the ED  stating that he did not have any ulcer in his foot.  He does have a  history of peripheral vascular disease, he is status post right below  knee amputation and multiple toe amputations on his left foot.  He also  has diabetes, hypertension, coronary disease with ischemic  cardiomyopathy, status post AICD in 2007.  He also has a history of  Graves' disease.  In the ED he was evaluated.  He is afebrile.  He is  hemodynamically stable.  He had an x-ray which showed post surgical and  degenerative changes about the forefoot without signs of osteo.  CBC  without leukocytosis.  BMET within normal limits.  We were called for  evaluation and admission as the patient had purulent foul smelling  drainage from that foot.   MEDICATIONS:  Patient is unaware of his medications.  Per ER sheet,  shows he is on  1. Aspirin 325 daily.  2. Coreg 25 mg b.i.d.  3. Avapro 200 mg daily.  4. Lipitor 40 mg daily.  5. Protonix daily.  6. Imdur 120 mg.  7. Glipizide unknown dose.   ALLERGIES:  States he is allergic to [facility].   PAST MEDICAL HISTORY:  As stated above.   PAST SURGICAL HISTORY:  1. Right BKA in 2002.  2. Left foot fourth toe amputation in 2002.  3. Patient has had cardiac catheterization in the past.  4. Status post AICD placement.   SOCIAL HISTORY:  Denies alcohol, tobacco or drugs.   FAMILY HISTORY:  Mother had diabetes.  Father had heart problems.  He  had four brothers and four sisters.  One brother deceased from diabetic  complications.  One deceased from heart trouble, status post MI.  One  sister deceased from breast CA.   REVIEW OF SYSTEMS:  As stated in the HPI.   PHYSICAL EXAMINATION:  GENERAL APPEARANCE:  Patient is alert and  oriented, in no apparent distress.  HEENT:  Unremarkable.  CHEST:  Clear.  CARDIOVASCULAR:  Regular.  ABDOMEN:  Obese, no hepatosplenomegaly.  EXTREMITIES:  Right BKA.  Patient does have a stump shaper in place on  the left foot.  Post surgical changes, status post amputation of second,  third and fifth toe.  Patient has an ulceration at the base of his first  toe, indurated at the time of my evaluation, no purulent drainage could  be appreciated, however, the ED physician states that purulent drainage  was present at the time of her evaluation.  NEUROLOGIC:  Significant for neuropathy in the lower extremities.   ASSESSMENT:  1. Left foot ulcer with purulent drainage.  2. Diabetes.  3. Hypertension.  4. Coronary disease with left ventricular dysfunction status post      automatic implanted defibrillator.  5. History of Graves'.  6. Obesity.   RECOMMENDATIONS:  Patient to be admitted to a medicine floor bed.  Will  continue with IV antibiotics, will quickly change to p.o. antibiotics.  Will obtain follow-up CBC.  No osteo is noted on x-ray.  Will consider  MRI if clinical course dictates.  Patient will  need some surgical  debridement.  This potentially could be done at a wound care center.  However, will follow up in the a.m. and make that determination at that  time.      Deirdre Peer. Polite, M.D.  Electronically Signed     RDP/MEDQ  D:  07/23/2007  T:  07/23/2007  Job:  [id number]

## 2011-04-13 NOTE — Consult Note (Signed)
NAMEMADOX, CORKINS                  ACCOUNT NO.:  [id number]   MEDICAL RECORD NO.:  [id number]          PATIENT TYPE:  EMS   LOCATION:  MAJO                         FACILITY:  [facility]   PHYSICIAN:  Bevelyn Buckles. Bensimhon, MDDATE OF BIRTH:  17-Oct-1929   DATE OF CONSULTATION:  DATE OF DISCHARGE:                                 CONSULTATION   DICTATED BY:  [name], ANP   PRIMARY CARDIOLOGIST:  Veverly Fells. Excell Seltzer, MD   PRIMARY CARE PHYSICIAN:  Corwin Levins, MD   PATIENT PROFILE:  A 77 year old African American male, with history of  CAD, mixed ischemic and nonischemic cardiomyopathy status post Bi-VICD,  presents to the ED with a several hour history of intermittent,  fleeting, sharp, shooting left shoulder pain.   PROBLEMS:  1. CAD.      a.     Status post anteroseptal MI in 1996, with PTCA of the LAD.      b.     February 12, 2000 - PCI and stenting of the mid and distal RCA,       placement of a 4.0 x 18 mm TETRA bare metal stent in the mid RCA       and a 2.5 x 18 mm TETRA bare metal stent in the distal RCA.      c.     June 22, 2000 - PTCA of the distal RCA secondary to in-stent       restenosis.      d.     September 12, 2003 - PCI and stenting of the ostial and       proximal LAD, with placement of a 3.0 x 33 mm CYPHER drug-eluting       stent.      e.     November 19, 2003 - PCI and stenting of the proximal OM2,       with a 3.0 x 13 mm CYPHER drug-eluting stent.      f.     Apr 11, 2006 - Cardiac catheterization, left main normal,       LAD patent, left circumflex subtotal occlusion distally, ramus       intermedius 80%, OM1 80% and superficial branch RCA 30% to 40%       distal, PL 70%.  He was medically managed.  2. Mixed ischemic and dilated/hypertensive cardiomyopathy.      a.     A 2D echocardiogram, February 2007, EF 40% to 50% without       wall motion abnormalities.      b.     Status post MedTronic Concerto biventricular ICD May 25, 2006.  3.  Hypertension.  4. History of syncope.  5. Diabetes mellitus.  6. GERD.  7. Graves disease status post a radioactive iodine ablation 2006.  8. Hyperlipidemia.  9. Status post right BKA.  10.Status post left distal foot amputation.  11.Peripheral vascular disease.  12.History of left foot ulcers.   HISTORY OF PRESENT ILLNESS:  A 77 year old African American male with  history of CAD and mixed ischemic  and nonischemic cardiomyopathy, who  was in his usual state of health until early this a.m., when at 12 a.m.,  he noted brief, sharp, shooting left shoulder and left upper chest pain  above his ICD site, lasting 1-3 seconds and resolving spontaneously.  Symptoms recurred every few hours during the night, prompting him to  call into the office this morning.  He was advised to come here to the  ED and during my interview, he did have a brief episode of shooting pain  in his upper left chest and left shoulder, which resolved spontaneously  without any rhythm disturbance noted on the monitor during the  discomfort.  His vital signs have remained stable.  He says I know that  this pain is coming from this defibrillator, but I do not want to get  you involved in it.  I noted that in prior evaluation, by Dr. Excell Seltzer in  May of 2008, the patient believed that he was involved in some sort of  government conspiracy and coverup and that the government tracks him via  his ICD.  He is suggesting that the pain is secondary to this coverup.   ALLERGIES:  1. PERCOCET CAUSES NAUSEA.  2. PERCODAN CAUSES NAUSEA.   MEDICATIONS:  Home:  1. Coreg 25 mg b.i.d.  2. Avapro 300 mg every day.  3. Plavix 75 every day.  4. Glipizide XL 10 mg every day.  5. Synthroid 125 mcg every day.  6. Lipitor 40 mg every day.  7. Lasix 40 mg every day.  8. Imdur 120 mg every day.  9. Aspirin 325 mg every day.  10.Lantus 30 units q. a.m.   FAMILY HISTORY:  Mother died of diabetes at age 52.  Father died of an  MI at 93.   There is no coronary disease in the siblings.   SOCIAL HISTORY:  Lives in [place] with a friend.  He is retired from  a cigarette factory, although he has never smoked himself.  He denies  any alcohol or drugs.  He is not routinely exercising.   REVIEW OF SYSTEMS:  Positive for chronic dyspnea on exertion at 5200  yards.  This is unchanged.  He has stable 1 pillow orthopnea and denies  any PND, dizziness, syncope, edema or early satiety.  He is diabetic.  Otherwise all systems reviewed are negative.   PHYSICAL EXAMINATION:  VITAL SIGNS:  Temperature 98.1, heart rate 68,  respirations 20, blood pressure 121/67, pulse ox 97% on 3 liters.  GENERAL:  A pleasant African American male in no acute distress.  Awake,  alert and oriented x3.  NECK:  No bruits or JVD.  LUNGS:  Respirations regular and unlabored.  Clear to auscultation.  CARDIAC:  Regular S1, S2.  No S3, S4 or murmurs.  ABDOMEN:  Obese, soft, nontender, nondistended.  Bowel sounds present x4  extremities.  SKIN:  Warm and dry.  EXTREMITIES:  No clubbing, cyanosis or edema.  Dorsalis pedis, posterior  tibial pulses 1+ on the left.  He has a right BKA.   LABORATORY DATA:  Chest x-ray is pending.  EKG shows a paced rhythm at  67 beats per minute.  Hemoglobin 12.6.  Hematocrit 37.6.  WBC 4.8.  Platelets 146.  Sodium 135.  Potassium 3.6.  chloride 102.  CO2 29.  BUN  17.  Creatinine 1.4.  Glucose 223.  PT 13.8.  INR 1.0.  CK 180.  MB 3.7.  Troponin-I 0.04.   ASSESSMENT AND PLAN:  1.  Left shoulder pain - sharp, shooting, fleeting, atypical pain,      different from previous angina.  There seems to be a psych      component to his pain.  He is suggesting that the government is      tracking him and hurting him through his ICD.  Defer to primary      care with regards to this previously documented paranoia.  His      cardiac markers are negative, and we will follow up his chest x-      ray.  Also interrogate his device to ensure  proper functioning and      providing that that looks okay, we will plan to discharge him from      the ED.  2. Mixed ischemic and nonischemic cardiomyopathy.  He is euvolemic on      exam.  Continue beta blocker and ARB.  3. Coronary artery disease.  He denies any angina.  He has stable,      chronic dyspnea on exertion.  See number 1.  Continue beta blocker,      aspirin, statin and nitrate.  4. Diabetes mellitus.  Continue home Lantus.      Bevelyn Buckles. Bensimhon, MD  Electronically Signed     DRB/MEDQ  D:  10/04/2007  T:  10/04/2007  Job:  [id number]

## 2011-04-13 NOTE — Assessment & Plan Note (Signed)
[facility]   NAME:  [name], [name]                  ACCOUNT NO.:  [id number]   MEDICAL RECORD NO.:  [id number]      DATE OF BIRTH:  1929-08-10   PHYSICIAN:  Theresia Majors. Tanda Rockers, M.D. VISIT DATE:  05/01/2008                                   OFFICE VISIT   SUBJECTIVE:  Mr. Cathey is a 77 year old man who we have treated with a  Wagner III diabetic foot ulcer.  The patient was last seen on Apr 24, 2008.  He was discharged from active care in the Wound Center at that  time.  He was asked to return in one week to confirm that he did have  appropriate footwear.  He continues to be ambulatory.  He is present  with his footwear.   OBJECTIVE:  The patient has a custom shoe with an insert that appears to  be adequate.  The wound is 100% re-epithelialized with no evidence of  interim breakdown.  The patient is discharged.  He has been instructed  to keep his followup with his primary care doctor, Dr. Melvyn Novas.  We have  given him an opportunity to ask questions.  He expresses gratitude for  having been seen in the clinic. He is discharged.      Harold A. Tanda Rockers, M.D.  Electronically Signed     HAN/MEDQ  D:  05/01/2008  T:  05/02/2008  Job:  [id number]

## 2011-04-13 NOTE — Assessment & Plan Note (Signed)
[facility]   NAME:  [name], [name] NO.:  [id number]   MEDICAL RECORD NO.:  [id number]      DATE OF BIRTH:  1929/01/25   PHYSICIAN:  Maxwell Caul, M.D.      VISIT DATE:                                   OFFICE VISIT   [name] is a gentleman who has a Wagner's grade 3 diabetic foot ulcer  over his left first metatarsal head.  He has declined hyperbaric oxygen  after being approved for this.  He is also apparently not eligible for  home health based on his non-homebound status.  Previously he was using  a moist to moist dressing and a Darco healing wedge.  When I saw him  last week we applied Silverseal wound gel, an ABD pad, off-loaded him in  his Darco healing sandal.   He returns today having no undue pain, fever or malodor.  His  temperature is 97.8, pulse 74, respirations 16, blood pressure is  126/74.  His wound measures 1.5 x 0.9.  It is granulated but not  epithelializing.  It still appears somewhat dry.  We applied Silver  Shield gel and an ABD pad and have been offloading him with his modified  Darco healing sandal.  We will see him back in a week's time.  He did  talk to me about an Art gallery manager from the [employer].  However,  I am not sure he would qualify based on his functional upper extremity  strength.   We will see about the wound supplies for him.           ______________________________  Maxwell Caul, M.D.     MGR/MEDQ  D:  12/15/2007  T:  12/15/2007  Job:  [id number]

## 2011-04-13 NOTE — Assessment & Plan Note (Signed)
[facility]   NAME:  [name], [name]                  ACCOUNT NO.:  [id number]   MEDICAL RECORD NO.:  [id number]      DATE OF BIRTH:  1929/11/01   PHYSICIAN:  Theresia Majors. Tanda Rockers, M.D. VISIT DATE:  02/20/2008                                   OFFICE VISIT   SUBJECTIVE:  [name] is a 77 year old man whom we have followed for  Wagner 3 diabetic foot ulcer involving the left hallux.  In the interim  the patient has been treated with an offloading healing sandal and local  care using a silver gel.  He continues to be ambulatory.  There has been  no fever, pain, or malodor.   OBJECTIVE:  Blood pressure is 114/69, respirations 16, pulse rate 65,  temperature 97.4, capillary blood glucose is 170 mg percent.  Inspection of his left foot shows that there is trace edema.  Pedal  pulses indeterminate and consistent with his known level of arterial  insufficiency the foot shows the postoperative changes of multiple  transmetatarsal digital amputations.  The hallux deformity is self  evident with a clean punched out ulcer on the volar aspect of the foot  approximating the met head.  This wound has decreased slightly.  There  is no evidence of ascending infection or extensive necrosis and no  debridement was needed.   ASSESSMENT:  A Wagner 3 diabetic foot ulcer with inadequate offloading.   PLAN:  We have referred the patient for orthotics to fit him with an  offloading healing sandal.  He will continue his local hygiene with use  of the silver gel, antiseptic soap and dressing.  We will reevaluate him  in 2 weeks.      Harold A. Tanda Rockers, M.D.  Electronically Signed     HAN/MEDQ  D:  02/20/2008  T:  02/20/2008  Job:  [id number]

## 2011-04-13 NOTE — Assessment & Plan Note (Signed)
[facility]   NAME:  [name], [name]                  ACCOUNT NO.:  [id number]   MEDICAL RECORD NO.:  [id number]      DATE OF BIRTH:  [birth year]   PHYSICIAN:  Theresia Majors. Tanda Rockers, M.D. VISIT DATE:  11/16/2007                                   OFFICE VISIT   SUBJECTIVE:  [name] is a 77 year old man whom we are following for  Wagner grade 3 diabetic foot ulcer.  The patient has been offered  hyperbaric oxygen treatment but has deferred initiation of that  treatment due to multiple personal and legal issues.  In the interim he  continues to utilize a moist-moist dressing technique with the  application of a bulky dressing and an offloading Darco healing wedge  sandal.  He continues to be under the care of his internist with  reasonable control of his diabetes.   OBJECTIVE:  Blood pressure is 168/76, respirations 18, pulse rate 63,  temperature is 98, capillary blood glucoses 135 mg percent.  Inspection of the left volar hallux shows that the wound is clean.  There is an extensive amount of radial callus.  There are areas of  necrosis with undermining.  An excisional debridement was performed  utilizing a 10 blade with resection of callus, skin, necrotic material,  subcutaneous tissue.  Minimum hemorrhage was controlled with direct  pressure.   ASSESSMENT:  Wagner 3 diabetic foot ulcer.   PLAN:  We will continue the moist-moist dressing and the offloading  healing sandal.  We will proceed with hyperbaric oxygen therapy  contingent upon the patient's informed consent and willingness to comply  with the requirements of attendance. We will re-evaluate him in 2 weeks  prn.      Harold A. Tanda Rockers, M.D.  Electronically Signed     HAN/MEDQ  D:  11/16/2007  T:  11/16/2007  Job:  [id number]

## 2011-04-13 NOTE — Letter (Signed)
Apr 06, 2007    Corwin Levins, MD  [address]  [place], [place] [address]   RE:  NEFTALI, ABAIR  MRN:  [id number]  /  DOB:  15-Nov-1929   Dear Dr. [name]:   It was my pleasure to see Torence Palmeri as an outpatient at the [facility]  Cardiology Office on Apr 06, 2007.   As you know, Mr. Viloria is a 77 year old gentleman with a history of  ischemic cardiomyopathy who presents today to establish care.   Mr. Frenette has previously been followed by [facility].  I believe  Dr. Mayford Knife has been his regular cardiologist, and Dr. Amil Amen has  performed a biventricular ICD implantation.  His history is somewhat  complex, and I do not have many records in the office today.  From  records at [facility], it appears he has undergone multiple percutaneous  interventions in the past.  He has had myocardial infarctions in the  past as well.  His most recent catheterization was performed in May  2007, and demonstrated a widely patent LAD, 80% intermediate branch  stenosis, branch vessel disease in the left circumflex of approximately  80%, and nonobstructive disease in the right coronary artery with a 70%  stenosis in the posterolateral branch of that vessel.  His LV EF is  known to be severely reduced with a description of an EF 20% in 1 of the  notes.  He underwent biventricular ICD placement on May 25, 2006.   Mr. Baranowski really does not have any acute complaints today.  He denies  chest pain, dyspnea, orthopnea, PND, edema, or syncope.  His ICD has not  discharged.   However, Mr. Majerus is convinced that the [place] Government is out to get  him.  There is a long story that involves Mr. Krenz filing a law suit  many years ago, and the attorney that represented him apparently stole  his settlement.  He says that he has taken this to every level of the  government, including the [facility], and that there is a huge cover up  involving several government agencies.  He also is concerned that he can  be  tracked through his ICD, and would like to know whether the device  can be taken out.   CURRENT MEDICATIONS:  Include:  1. Carvedilol 25 mg twice daily.  2. Avapro 300 mg daily.  3. Plavix 75 mg daily.  4. Protonix 40 mg daily.  5. Glipizide XL 10 mg daily.  6. Levothyroxine 125 mcg daily.  7. Lipitor 40 mg daily.  8. Lasix 40 mg daily.  9. Imdur 120 mg daily.  10.Aspirin 325 mg daily.  11.Lantus insulin as directed.   PAST MEDICAL HISTORY:  1. Coronary artery disease as detailed above.  Multiple percutaneous      coronary interventions have been performed.  2. Mixed cardiomyopathy, likely secondary to ischemic heart disease      and hypertension.  EF is reported at less than 30%.  3. History of biventricular ICD placement.  4. Type 2 diabetes.  5. Right below knee amputation.  6. Left distal foot amputation.  7. Hypertension.  8. Dyslipidemia.  9. History of Graves' disease.   SOCIAL HISTORY:  The patient lives with a friend.  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  He has no history of smoking  cigarettes, using drugs, or drinking alcohol.  He does not exercise.  He  has no children.  FAMILY HISTORY:  His mother died at age 20 from complications of  diabetes.  Father died at age 22 of myocardial infarction.  He has  multiple siblings.  There is no coronary artery disease in the family by  his report.   REVIEW OF SYSTEMS:  A complete 12-point review of systems was performed.  Pertinent positives included urinary problems, arthritis, anxiety,  depression, peptic ulcer disease, hiatal hernia, constipation, fatigue,  ankle swelling, headaches, dizziness, calf pain.   All other systems were reviewed, and are negative except as detailed.   PHYSICAL EXAMINATION:  The patient is alert and oriented.  He is in no  acute distress.  Weight is 260 pounds.  Blood pressure is 106/64 in the right arm, 106/64  in the left arm.  Heart rate 68.  Respiratory rate 16.   HEENT:  Normal.  NECK:  Normal carotid upstrokes without bruits.  Jugular venous pressure  is normal.  There is no thyromegaly or thyroid nodules that are  palpable.  LUNGS:  Clear to auscultation bilaterally.  HEART:  The apex is discrete and nondisplaced.  Heart is regular rate  and rhythm without murmurs or gallops.  The ICD site is clear.  ABDOMEN:  Soft and non-tender, no organomegaly.  Normal bowel sounds.  No bruits.  EXTREMITIES:  There is no clubbing, cyanosis, or edema.  There is a  right BKA, and the left 2nd through 5th toes are amputated.  The left  great toe is deformed.  Distal pulses are not palpable, but the left  foot is warm.  NEUROLOGIC:  Cranial nerves 2 through 12 are intact.  Strength in the  arms is intact and equal.  SKIN:  Warm and dry.  LYMPHATICS:  There is no adenopathy.   ASSESSMENT:  Mr. Brogden appears to be stable from a cardiovascular  standpoint.  His cardiac problems are as follows:  1. Coronary artery disease.  He is having no anginal symptoms.  I will      get his records for review.  He is on an excellent medical regimen      for coronary artery disease and myopathy.  2. Mixed cardiomyopathy secondary to hypertensive heart disease as      well as ischemic heart disease.  Again, he appears to be on an      excellent regimen, and his blood pressure is under excellent      control today.  He does not have any congestive symptoms at      present.  3. Status post cardiac resynchronization therapy.  Dr. Ladona Ridgel came in      and introduced himself today.  He will be seeing Mr. Knierim for his      initial visit in a few months.  His device was interrogated after      the visit today.  4. Paranoid delusions.  This is my first encounter with Mr. Haubner, so      I do not know what his baseline is.  He seems to have had      longstanding paranoia about this government cover up.  I will touch     base with Dr. [name] to make sure this has been addressed, and  that      this is not a new problem.   I would like to get all of the records regarding Mr. Gonzaga' cardiac  condition, which we have requested.  After I have a chance to review  everything, I will plan on  seeing him back in followup.  Again, his EP  followup has been set up.   Dr. [name], thanks for the opportunity to see Mr. Sanker.  Please feel free  to call at any time with questions regarding his care.    Sincerely,      Veverly Fells. Excell Seltzer, MD    MDC/MedQ  DD: 04/06/2007  DT: 04/06/2007  Job #: [id number]

## 2011-04-13 NOTE — Assessment & Plan Note (Signed)
[facility]                         ELECTROPHYSIOLOGY OFFICE NOTE   NAME:Schnackenberg, JAYCEE PELZER                         MRN:          [id number]  DATE:06/16/2007                            DOB:          Jun 10, 1929    Mr. Simonetti is referred today for consultation of his ICD implantation.  The patient is a very pleasant 77 year old man with a history of an  ischemic cardiomyopathy and congestive heart failure.  He has a known  left bundle branch block and underwent BiV ICD insertion back in June  2007, but Dr. Corliss Marcus.  The patient since then, has what I would  describe as class II plus heart failure symptoms.  He denies frank  syncope and he has had no intercurrent IC therapies since his device was  placed.  He does have dyspnea on exertion and nocturia x3.   PAST MEDICAL HISTORY:  1. Long-standing diabetes.  2. He has severe peripheral vascular disease with a right below the      knee amputation and a left foot amputation distally.  3. He has hypertension.  4. Dyslipidemia.  5. History of Grave's disease.  6. Ischemic cardiomyopathy, status post MI.  7. He has had multiple percutaneous coronary interventions.  8. He has dyslipidemia.   MEDICATIONS:  1. Carvedilol 25 twice daily.  2. Avapro 300 a day.  3. Plavix 75 a day.  4. Protonix 40 a day.  5. Glipizide 10 a day.  6. Synthroid 125 a day.  7. Lipitor 40 a day.  8. Lasix 40 a day.  9. Imdur 120 daily.  10.Aspirin daily.  11.Insulin as directed.   SOCIAL HISTORY:  The patient is not married.  He is retired from  Water quality scientist.  He denies tobacco use or alcohol abuse.  He  is retired.   FAMILY HISTORY:  Notable for mother dying of complications of diabetes  at a very advanced age and his father dying of an MI in his 73s.   REVIEW OF SYSTEMS:  Notable for pain in his leg when he walks.  He has  arthritis.  He has dysuria and nocturia.  He has a history of  depression, anxiety,  and some history of delusional disorder.  He has a  history of headaches and fatigue.  The rest of his review of systems  were negative.   PHYSICAL EXAMINATION:  GENERAL:  He is a pleasant 77 year old man in no  acute distress.  VITAL SIGNS:  His blood pressure was 156/88; the pulse was 66 and  regular; respirations were 18; the weight was 265 pounds.  HEENT:  Normocephalic and atraumatic.  Pupils equal and round.  The  oropharynx was most.  The sclerae were anicteric.  NECK:  Revealed no bruits.  There is no jugular venous distention.  And,  there is no thyromegaly.  Trachea is midline.  LUNGS:  Clear bilaterally on auscultation.  No wheezes, rales, or  rhonchi.  There is no increased work-of-breathing.  CARDIOVASCULAR:  Demonstrates a regular rate and rhythm with normal S1  and S2.  I did not  appreciate any murmurs, rubs, or gallops.  The PMI  was enlarged and laterally displaced.  ABDOMEN:  Soft, nontender, nondistended.  There is no organomegaly.  Bowel sounds were present.  No rebound or guarding.  EXTREMITIES:  Demonstrated a right below the knee amputation and a left  sided toe amputation.  The foot was warm but there were no distal pulses  present.  NEUROLOGIC:  Alert and oriented x3 with cranial nerves intact and  strength was 5/5 and symmetric.  SKIN:  Normal.   EKG demonstrates a sinus rhythm with biventricular pacing.   Interrogation of his defibrillator which was carried out in May,  demonstrates satisfactory device function.   IMPRESSION:  1. Ischemic cardiomyopathy.  2. Congestive heart failure.  3. Left bundle branch block.  4. Status post BiV ICD insertion.  5. Severe peripheral vascular disease.   DISCUSSION:  Overall, Mr. Trivedi defibrillator is working normally.  If  his heart failure symptoms were to worsen, then we would consider AV  optimization but for now, I would recommend holding off on this and  seeing how the patient does.  We will see him back in  our device clinic  in followup and I will see him back in one year.     Doylene Canning. Ladona Ridgel, MD  Electronically Signed   GWT/MedQ  DD: 06/16/2007  DT: 06/16/2007  Job #: [id number]   cc:   Veverly Fells. Excell Seltzer, MD  Corwin Levins, MD

## 2011-04-13 NOTE — Assessment & Plan Note (Signed)
[facility]   NAME:  ABIMELEC, GROCHOWSKI                  ACCOUNT NO.:  [id number]   MEDICAL RECORD NO.:  [id number]      DATE OF BIRTH:  November 08, 1929   PHYSICIAN:  Theresia Majors. Tanda Rockers, M.D. VISIT DATE:  01/25/2008                                   OFFICE VISIT   SUBJECTIVE:  [name] is a 77 year old man who we are following for  Wagner 3 diabetic foot ulcer involving the left hallux.  In the interim  the patient has been treated with a silver gel dressing, 4 x 4 bulky  dressing, and offloading sandal.  There has been no interim fever.  He  continues to be ambulatory with the aid of a below knee prosthesis.   OBJECTIVE:  Blood pressure is 144/83, respirations 16, pulse rate 65,  temperature is 98.2.  Capillary blood glucose is 147 mg%.  Inspection of the ulcer shows that there is an intense halo of callus  which was effectively pared using a 10 blade.  The resulting hemorrhage  was controlled with silver nitrate.  The pedal pulse is indeterminate,  but there is no acute ischemia. The foot is warm but not feverish.  There is 1+ edema at best.   ASSESSMENT:  Static Wagner 3 ulceration without infection, adequately  debrided.   PLAN:  We will continue the patient with a silver dressing, a bulky  wrap, and an offloading healing sandal.  We will reevaluate the patient  in two weeks p.r.n.      Harold A. Tanda Rockers, M.D.  Electronically Signed     HAN/MEDQ  D:  01/25/2008  T:  01/26/2008  Job:  [id number]

## 2011-04-13 NOTE — Assessment & Plan Note (Signed)
[facility]                            CARDIOLOGY OFFICE NOTE   NAME:[name], [name] MAGES                         MRN:          [id number]  DATE:01/13/2009                            DOB:          1929/01/01    REASON FOR VISIT:  Chronic heart failure.   HISTORY OF PRESENT ILLNESS:  [name] is a 77 year old gentleman with  underlying coronary artery disease and mixed ischemic and nonischemic  cardiomyopathy.  He presents today for routine followup.  He has a  complicated history and has undergone multiple percutaneous coronary  interventions dating from 2001 through 2004.  His last cardiac  catheterization was in 2007.  It was elected that he be treated  medically at that time.  His LVEF has been as low as 40%, but most  recently when assessed was up to 60%.  Of note in the interim, he had  undergone biV ICD placement.   His medical history has also been complicated by paranoid delusions.  When I first saw him, he thought that the FBI or [employer] was keeping track  of him through his defibrillator and he wanted it taken out.  His  psychiatric disease seems to be under better control today, but in  reviewing his records, he was recently seen last month in the emergency  department because of paranoid delusions as well.   The patient has had a few episodes of fleeting chest pains.  He has had  no exertional symptoms.  He denies shortness of breath.  He gets around  between a motorized scooter and a cane.  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   CURRENT MEDICATIONS:  1. Januvia 1 daily.  2. Synthroid 150 mcg daily.  3. Omeprazole 20 mg 2 daily.  4. Avapro 300 mg daily.  5. Furosemide 40 mg daily.  6. Carvedilol 25 mg twice daily.  7. Glipizide XL 10 mg daily.  8. Plavix 75 mg daily.  9. Klor-Con 20 mEq daily.  10.Lantus 55 units daily.  11.Synthroid 125 mcg daily.  12.Lipitor 40 mg daily.  13.Imdur 120 mg daily.   PHYSICAL EXAMINATION:  GENERAL:  The patient is alert and oriented.  He  is in no acute distress.  He presents in a motorized scooter.  VITAL SIGNS:  Weight per the patient is 275 pounds, blood pressure  124/88, heart rate 69, respiratory rate 16.  HEENT:  Normal.  NECK:  Normal carotid upstrokes.  No bruits.  JVP normal.  LUNGS:  Clear bilaterally.  HEART:  Regular rate and rhythm.  No murmurs or gallops.  ABDOMEN:  Soft, obese, nontender, no organomegaly.  EXTREMITIES:  Right BKA noted.  The left leg has trace edema.  SKIN:  Warm and dry without rash.   EKG shows electronic ventricular pacemaker.   ASSESSMENT:  1. Chronic systolic heart failure secondary to mixed cardiomyopathy.      Left ventricular ejection fraction has recovered status post by the      implantable cardioverter-defibrillator placement.  Continue current  medical program without changes.  He is on good regimen with      combination of beta-blocker, angiotensin-receptor blocker, and      nitrate.  2. Coronary artery disease status post multiple percutaneous coronary      intervention procedures.  He is having no angina.  He has had some      atypical chest pains that are clearly noncardiac, as they are      fleeting and nonexertional in nature.  Continue Plavix, statin      therapy, and secondary risk reduction measures.  3. Status post bilateral ventricular implantable cardioverter-      defibrillator.  His device was interrogated today.  He had 1      episode of nonsustained ventricular tachycardia, but no discharges.  4. Diabetes, type 2.  Management per Dr. [name].  5. Dyslipidemia.  Lipid panel from January 01, 2009, showed a      cholesterol of 95, HDL 33, LDL 54.   For follow-up, I would like to see [name] back in 6 months.     Veverly Fells. Excell Seltzer, MD     MDC/MedQ  DD: 01/13/2009  DT: 01/14/2009  Job #: [id number]   cc:   Corwin Levins, MD

## 2011-04-13 NOTE — Assessment & Plan Note (Signed)
[facility]   NAME:  [name], [name]                  ACCOUNT NO.:  [id number]   MEDICAL RECORD NO.:  [id number]      DATE OF BIRTH:  10/13/1929   PHYSICIAN:  Theresia Majors. Tanda Rockers, M.D. VISIT DATE:  11/02/2007                                   OFFICE VISIT   SUBJECTIVE:  [name] is a 77 year old man followed for Wagner 3  diabetic foot ulcer.  In the interim we have treated him with daily  moist-moist dressings and an offloading healing sandal.  We have  arranged for him to begin hyperbaric oxygen treatment.  The patient has  indicated that he is not ready to begin the treatment and honor the  commitment for daily therapy.  He has requested that his treatments be  deferred and that he start HBO following his court actions.   In the interim he continues to have moderate serous drainage.  There has  been no fever.  There has been mild pain.  He continues to be ambulatory  with the offloading healing sandal.  There have been no  cardiorespiratory symptoms.   OBJECTIVE:  Blood pressure is 173/101, respirations 18, pulse rate 73,  temperature is 98, capillary blood glucose is 180 mg percent.  The value  was taken 4 days ago.  Inspection of the wound shows that the left foot is warm.  The pedal  pulse remains nonpalpable, but there is no evidence of acute ischemia.  The wound on the plantar surface remains at 1.5 cm in diameter and 0.2  cm in depth.  There is 100% granulation.  There is no active infection,  abscess or tracking.  The edema is 2+.  The patient remains insensate.   ASSESSMENT:  Wagner grade 3 diabetic foot ulcer.   PLAN:  We would prefer to proceed with HBO but the patient will continue  offloading and daily moist-moist dressings.  We will reevaluate him in 2  weeks, at which time he should have completed his court deliberations  and should be able to start hyperbaric therapy.  We have specifically  counseled the patient that he is at risk of major limb  loss.  He seems  to appreciate the risks that he is taking in deferring the recommended  treatment.  We will reevaluate him in 2 weeks.      Harold A. Tanda Rockers, M.D.  Electronically Signed     HAN/MEDQ  D:  11/02/2007  T:  11/02/2007  Job:  [id number]

## 2011-04-13 NOTE — Assessment & Plan Note (Signed)
[facility]   NAME:  [name], [name] NO.:  [id number]   MEDICAL RECORD NO.:  [id number]      DATE OF BIRTH:  03-21-1929   PHYSICIAN:  Maxwell Caul, M.D. VISIT DATE:  12/29/2007                                   OFFICE VISIT   [name] has been followed here for Wagner's grade 3 ulcer over his  first metatarsal head on the left.  He declined hyperbaric oxygen due to  concerns about claustrophobia.  We have been applying Silver shield  wound gel on ABD pad.  He has an offloading Darco sandal with a felt  offloading strip.  He is changing his dressing every second day.   PHYSICAL EXAM:  VITALS:  Temperature is 97. 5, pulse 55, respirations  18, blood pressure 99/56.  The wound itself is actually looks fairly  stable.  It is granulating but not epithelializing.  There is no reason  for me to do any further debridement here by callus suggesting he has  adequately offloading.   IMPRESSION:  Wagner's grade 3 diabetic foot wound.  I have continued the  same treatment regimen.  He does not wish hyperbaric oxygen.  He  actually arrived in a healing sandal today.  I wonder about is compliant  with offloading.  Nevertheless the wound itself actually looks fairly  stable and I am still hopeful we might get some degree of healing.           ______________________________  Maxwell Caul, M.D.     MGR/MEDQ  D:  12/29/2007  T:  12/30/2007  Job:  [id number]

## 2011-04-13 NOTE — Assessment & Plan Note (Signed)
[facility]   NAME:  [name], [name] NO.:  [id number]   MEDICAL RECORD NO.:  [id number]      DATE OF BIRTH:  1929-07-12   PHYSICIAN:  Maxwell Caul, M.D. VISIT DATE:  03/11/2008                                   OFFICE VISIT   HISTORY:  [name] returns today in followup for his Wegener's grade 3  diabetic foot ulcer involving the plantar surface of his left first  metatarsal head.  In the interim, he has been treated with an offloading  healing sandal and he has been using antiseptic soap and silver gel.  He  continues to be ambulatory.  He says there is some pain and minimal  drainage but no malodor.  He wanted antibiotics today.   PHYSICAL EXAMINATION:  On examination, temperature is 97.8, pulse 61,  respirations 16, blood pressure 90/54.  He does not look to be ill.  Inspection of the left foot shows that there is no edema.  Peripheral  pulses are difficult to feel.  There were multiple surgeries previously  including multiple transmetatarsal amputations.  The affected first  metatarsal head has an ulcer that measured 2 x 0.9 x 0.3, however, from  12 to 6 o'clock there was at least 1 cm of overhang here.  The overhang  part of this was new.  There was no evidence of surrounding infection or  infection of the wound itself.  Nothing was cultured.   The 1 cm of overhang was removed using a #10 blade.  No anesthesia or  hemostasis was necessary.  The wound itself was also debrided of a mild  amount of slough.  Again, no anesthesia or hemostasis was necessary.   IMPRESSION:  Wegener's grade 3 diabetic foot ulcer with evidence of  inadequate offloading.  Although, there is some concern about this  gentleman's compliance, I think the only way to offload this wound  currently is to use a cast.  I have done the debridement noted above.  We will continue with his antiseptic soap washes, Silver Shield gel, a  bulky dressing, and a offloading  strip for his healing sandal.  We will  see him back in some time this week to consider him for a cast.  We  explained to him that he would need to come in with somebody who will  transport him as he will not be able to put weight on this for the first  24 hours and then minimal weight for the next 24 after that.  He seems  to understand, although I wonder about his compliance.  Nevertheless, I  thought it was important, given the status of the wound today to give  him an opportunity for the best offloading care.           ______________________________  Maxwell Caul, M.D.     MGR/MEDQ  D:  03/11/2008  T:  03/12/2008  Job:  [id number]

## 2011-04-13 NOTE — Assessment & Plan Note (Signed)
[facility]   NAME:  [name], [name]                  ACCOUNT NO.:  [id number]   MEDICAL RECORD NO.:  [id number]      DATE OF BIRTH:  May 04, 1929   PHYSICIAN:  Maxwell Caul, M.D. VISIT DATE:  09/15/2007                                   OFFICE VISIT   PURPOSE TODAY'S VISIT:  Followup of the Wagner's grade 4 diabetic wound.  In the interim he has been receiving Silverlon, hydrogel, and a Darco  wedge healing sandal.  He has also been prior approved for HBO.   EXAMINATION:  Temperature is 98.7, pulse 70, respirations 16, blood  pressure 150/80, blood glucose is 165.  Over the left anterior plantar and left superior plantar there are two  areas.  The area superiorly on the first toe actually looks fairly good  and is well on its way to fully epithelializing.  Over the metatarsal  head inferiorly, the wound measures 1x1x0.3.  There is no  epithelialization here.  However, a healthy looking granulating bed.  There is no slough.  No evidence of cellulitis.   IMPRESSION:  1. Diabetic Wagner's grade 4 ulcer.  The superior ulcers improved as      noted above.  2. The inferior ulcer also looks to be stable.  I continued the      current treatment regimen outlined by Dr. Tanda Rockers including      Silverlon, hydrogel, SofSorb dressing with a Darco wedge.  He is      prior approved for HBO.  We will see him again next week.  I      believe he is on the HBO waiting list.           ______________________________  Maxwell Caul, M.D.     MGR/MEDQ  D:  09/15/2007  T:  09/16/2007  Job:  [id number]
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
## 2011-04-13 NOTE — Assessment & Plan Note (Signed)
[facility]   NAME:  [name], [name] NO.:  [id number]   MEDICAL RECORD NO.:  [id number]      DATE OF BIRTH:  May 20, 1929   PHYSICIAN:  Maxwell Caul, M.D. VISIT DATE:  12/22/2007                                   OFFICE VISIT   [name] is being followed here for a Wegener's grade 3 diabetic foot  ulcer over his first metatarsal head on the left.  He declined  hyperbaric oxygen after being approved for this by his insurance.  We  have been applying Silver Shield wound gel, an ABD pad and he has a  Darco offloading healing sandal with offloading felt strips.  He is  changing this every second day.   PHYSICAL EXAMINATION:  On examination temperature is 97.4, pulse 73,  respirations 18, blood pressure is 98/61.  He does not appear medically  ill.  The wound itself underwent a thick callus removal to define the  edges of the wound.  Once this was done there was no additional evidence  of infection or necrotic material.  The wound edges were more properly  defined.   IMPRESSION:  Wegener's grade 3 diabetic foot wound. We have continued  the treatment with Silver shield, nonadherent dressing and a Darco  healing sandal.  I have emphasized the importance of hyperbaric oxygen  here.  He plans to tell me a decision on this next week.  He has some  personal issues that he wants to resolve before then.           ______________________________  Maxwell Caul, M.D.     MGR/MEDQ  D:  12/22/2007  T:  12/23/2007  Job:  [id number]

## 2011-04-13 NOTE — Assessment & Plan Note (Signed)
[facility]   NAME:  [name], [name]                  ACCOUNT NO.:  [id number]   MEDICAL RECORD NO.:  [id number]      DATE OF BIRTH:  [birth year]   PHYSICIAN:  Jonelle Sports. Sevier, M.D.  VISIT DATE:  04/10/2008                                   OFFICE VISIT   HISTORY:  This is a 77 year old black male with a surgical absence of  his left second toe and a striking hallux valgus is seen for plantar  ulcer on the plantar aspect of his first metatarsal head.   He has been treated with total contact casting and has made progressive  improvement with that methodology.  He is seen today after an additional  week in the cast and offers no problematic symptomatology.  He does note  that the cast was so long that when he sat, that rubbed the popliteal  area, and he requests that we make it a bit shorter if possible.   PHYSICAL EXAMINATION:  Blood pressure 139/85, pulse 64, respirations 18,  and temperature 98.3.  Capillary blood glucose 180 mg/dL.  The ulcer on the plantar aspect of the left foot now measures 0.9 x 0.3  x 0.1 cm and actually shows some partial epithelialization over what  small remaining ulcer that is.  There is no surrounding callus, no  erythema, no tenderness.   IMPRESSION:  Remarkable and continued improvement of plantar diabetic  foot ulcer.   DISPOSITION:  The wound today requires no debridement.   The patient will be returned to total contact cast, and this will be  left for 2 weeks with the anticipation in that period of time it will  look healed.   He is instructed to bring his left shoe with him to that visit, so we  can check it for its adequacy.  My suspicion is that he will need a  custom fitted or at least wide toe left shoe for that foot, if we are to  prevent further problems.           ______________________________  Jonelle Sports Cheryll Cockayne, M.D.     RES/MEDQ  D:  04/10/2008  T:  04/11/2008  Job:  [id number]

## 2011-04-13 NOTE — Assessment & Plan Note (Signed)
[facility]   NAME:  [name], [name]                  ACCOUNT NO.:  [id number]   MEDICAL RECORD NO.:  [id number]      DATE OF BIRTH:  [birth year]   PHYSICIAN:  Jake Shark A. Tanda Rockers, M.D. VISIT DATE:  08/15/2007                                   OFFICE VISIT   SUBJECTIVE:  Mr. Basaldua is a 77 year old man who we follow for Wagner  grade 2 diabetic foot ulcer, involving his left metatarsal head.  In the  interim, the patient has developed a secondary wound.  He has been  treated with an offloading sandal and daily antiseptic soap washes.  There has been no interim fever or swelling.   OBJECTIVE:  Blood pressure is 173/85, respirations 18, pulse rate 73,  temperature 98.7.  Capillary blood glucose is 205 mg%.  Inspection of  the left foot shows that the previous wound on the plantar surface of  the metatarsal first head has increased in area.  In addition, there is  a new wound medially to the former.  Neither wound appears to have  active drainage or abscess formation.  The pedal pulse is not palpable.  There is associated 1 to 2+ edema with chronic venous stasis changes.  Neurologically, the patient remains insensate.  His previous ABI was  0.7.   ASSESSMENT:  Deterioration of the ulceration of his left foot, most  likely related to a combination of microvascular disease associated with  diabetes.   RECOMMENDATIONS:  We will continue the patient with offloading  protection with a healing sandal.  We will continue the antiseptic soap  washing and use of a white cotton sock as a dressing.  We will also make  a referral to the vascular surgeon for evaluation and management of the  associated ischemia.   We have explained the clinical impression to the patient in terms that  he seems to understand.  We have given him an opportunity to asks  questions.  He expresses gratitude for having been seen in the clinic  and indicates he will proceed with the vascular evaluation.  He  will  continue to pursue the consultation with orthotics for custom foot wear  to effect the perfect offloading.  We will continue to see the patient  weekly.      Harold A. Tanda Rockers, M.D.  Electronically Signed     HAN/MEDQ  D:  08/15/2007  T:  08/15/2007  Job:  [id number]

## 2011-04-13 NOTE — Discharge Summary (Signed)
[name], [name]                  ACCOUNT NO.:  [id number]   MEDICAL RECORD NO.:  [id number]          PATIENT TYPE:  INP   LOCATION:  1401                         FACILITY:  [facility]   PHYSICIAN:  [name], MDDATE OF BIRTH:  06/19/1929   DATE OF ADMISSION:  09/02/2008  DATE OF DISCHARGE:  09/06/2008                               DISCHARGE SUMMARY   PRIMARY CARE PHYSICIAN:  Dr. Oliver [name].   DISCHARGE DIAGNOSES:  1. Fever of unknown origin.  2. Dyspnea on exertion.  3. Chronic renal insufficiency.  4. Left diabetic foot ulcer.   HISTORY OF PRESENT ILLNESS:  [name] is a 77 year old African American  male with past medical history of type 2 diabetes, ischemic  cardiomyopathy, hypertension and medical noncompliance status post right  BKA and left midtarsal amputation secondary to diabetic foot ulcers.  The patient presented to [facility] Emergency Room with reports of  fever the past several days.  The patient also noted abscess on left  buttock which was drained by emergency room physician prior to admission  evaluation.  At time of admission patient also complained of shortness  of breath that became worse when he got upset.  Shortness of breath was  described as intermittent, he denied any chest pain.  No nausea,  vomiting, diarrhea.  No known sick contacts.  The patient reported  compliance with medications.   PAST MEDICAL HISTORY:  1. History of diabetic foot ulcers.  2. Type 2 diabetes.  3. Hypertension.  4. Ischemic cardiomyopathy.  5. History of Graves disease.  6. Obesity.  7. Right BKA.  8. Left metatarsal amputation.   COURSE OF HOSPITALIZATION:  1. Fever of unknown origin.  At time of admission evaluation, patient      with fever of 101.20 with white cell count of 10.4.  Urinalysis      revealed few bacteria with some leukocytes, white cell count only      mildly elevated.  Chest x-ray performed at time of admission was      negative for any acute  findings.  Patient's left foot ulcer was x-      rayed with no evidence of osteomyelitis.  Question whether or not      patient's fever secondary to abscess on buttocks as mentioned      above, previously I and Dd in emergency room.  The patient was      placed on empiric vancomycin and Cipro at time of admission.  Blood      cultures ordered at time of admission were not obtained until after      antibiotic treatment was started.  The patient's fever suspected      secondary to UTI and with left buttock abscess, however, the      patient was insistent on being discharged home on September 06, 2008.      The patient still experiencing some low grade fever at time of      discharge however white blood cell count had decreased to 7.1.  The      patient did report he  would leave AMA if he was unable to be      discharged by physician.  The patient was felt medically stable      for discharge as he verified the importance of close follow-up with      primary care physician and compliance with continued antibiotic      therapy.  2. Dyspnea on exertion.  A 2D echo performed during this      hospitalization within normal limits.  Cardiac enzymes x3 were      negative.  EKG with no signs of ischemia.  The patient refused VQ      scan to rule out pulmonary embolism in setting of mildly elevated D-      dimer.  The patient's O2 sats and heart rate within normal limits      therefore low suspicion for PE.  The patient's dyspnea may be      secondary to anxiety however, again, the patient did refuse any      further workup.   MEDICATIONS AT TIME OF DISCHARGE.:  1. Septra DS 1 tablet p.o. b.i.d. times 10 days.  2. Synthroid 125 mcg p.o. daily.  3. Plavix 75 mg p.o. daily.  4. Aspirin 325 mg p.o. daily.  5. Lantus insulin 45 units subcutaneous nightly.  6. Avapro p.o. daily.  7. Coreg 25 mg p.o. b.i.d.  8. Lipitor 40 mg p.o. nightly.  9. Protonix 40 mg p.o. daily.  10.Imdur 120 mg p.o. daily.   11.Glipizide XL 10 mg p.o. daily.   PERTINENT LAB WORK AT TIME OF DISCHARGE:  White cell count 7.1, platelet  count 134, hemoglobin 11.7, sodium 141, potassium 3.4, BUN 13,  creatinine 1.1.  Urine culture pending at time of discharge.  Blood  cultures x2 obtained on September 03, 2008 were negative for any growth to  date, A1c 8.8, TSH within normal limits, cardiac enzymes negative x3.   DISPOSITION:  Again patient felt medically stable for discharge home at  this time as he verifies understanding of importance of close outpatient  follow-up as well as compliance with medication therapy.  The patient is  instructed to follow with his primary care physician Dr. Oliver [name] on  Tuesday, October 13 at 10:00 a.m.  In addition, Home Health Care will be  arranged order to remove packing in patient's left buttock abscess as  well as Bactroban and dressing changes to left foot daily.  The patient  is instructed to return to the ER if any recurrent fever or problems.   Greater than 30 minutes on discharge planning.      [name] Pen, NP      [name] Rover. Felicity Coyer, MD  Electronically Signed   LE/MEDQ  D:  09/27/2008  T:  09/27/2008  Job:  [id number]   cc:   [name] Levins, MD  [address]  [place]  [place] [address]

## 2011-04-13 NOTE — Assessment & Plan Note (Signed)
[facility]                            CARDIOLOGY OFFICE NOTE   NAME:[name], [name]                         MRN:          [id number]  DATE:07/12/2008                            DOB:          [birth year]    PRIMARY CARDIOLOGIST:  Veverly Fells. Excell Seltzer, MD   PRIMARY CARE Jaquay Morneault:  Corwin Levins, MD   UROLOGIST:  Excell Seltzer. Annabell Howells, MD   PATIENT PROFILE:  A 77 year old African-American male with prior history  of CAD and mixed ischemic and nonischemic cardiomyopathy who presents  for preoperative clearance of pending prostate biopsy.   PROBLEM LIST:  1. CAD.      a.     Status post anteroseptal MI in 1996 with PTCA of the LAD.      b.     February 12, 2000, PCI and stenting of the mid and distal RCA       with placement of 2 bare metal stents.      c.     June 22, 2000, PTCA of the distal RCA secondary to in-stent       restenosis.      d.     September 12, 2003, PCI and stenting ostial and proximal LAD       with a Cypher drug-eluting stent.      e.     November 19 1003, PCI and stenting of proximal OM-2 with a       Cypher drug-eluting stent.      f.     Apr 11, 2006, cardiac catheterization, left main normal, LAD       patent, left circumflex subtotal occlusion distally, ramus       intermedius 80%, OM-1 80%, RCA 30-40% distal, posterolateral       branch 70%.  The patient was medically managed.  2. Mixed ischemic and dilated/hypertensive cardiomyopathy.      a.     2-D echocardiogram in February 2007, EF 40-50% without wall       motion abnormalities      b.     Status post Medtronic concerto, biventricular ICD, May 25, 2006.  3. Hypertension.  4. History of syncope.  5. Diabetes mellitus.  6. GERD.  7. Graves disease, status post radioactive iodine ablation now with      hypothyroidism on replacement.  8. Status post right BKA.  9. Status post left distal foot amputation.  10.PVD.  11.History of foot ulcers.   HISTORY OF PRESENT ILLNESS:  A  77 year old African-American male with  above problem list.  He presents today as he is pending a prostate  biopsy and seeking cardiac clearance.  Since his last visit, which was  actually in the emergency room in November 2008, he has not had any  substernal chest discomfort or symptoms reminiscent to previous angina.  He denies any dyspnea on exertion or at rest.  He denies PND, orthopnea,  dizziness, syncope.  He does occasionally have left lower extremity  edema, which has been effectively treated by Lasix therapy by his  primary care [facility].  He is relatively sedentary, but denies  limitations with his ADLs.  He will occasionally have a burning  sensation that is brief, lasting 1 or 2 seconds and occurring above his  ICD site.  He has a history of this in the past as well.   ALLERGIES:  PERCOCET and PERCODAN cause nausea.   HOME MEDICATIONS:  1. Omeprazole 20 mg 2 capsules daily.  2. Simvastatin 80 mg daily.  3. Avapro 300 mg daily.  4. Furosemide 40 mg daily.  5. Carvedilol 25 mg b.i.d.  6. Glipizide XL 10 mg daily.  7. Plavix 75 mg daily.  8. Klor-Con 20 mEq daily.  9. Levothyroxine 150 mcg daily.  10.Lantus 55 units daily.  11.Imdur 60 mg daily.  12.Aspirin 325 mg daily.   PHYSICAL EXAMINATION:  VITAL SIGNS:  Blood pressure 121/69, heart rate  59, respirations 16, his weight is 268 pounds, up 3 pounds since a visit  in July 2008.  GENERAL:  Pleasant African-American male, in no acute distress.  Awake,  alert and oriented x3.  HEENT:  Normal.  NERVES:  Grossly intact.  Nonfocal.  SKIN:  Warm without lesions or masses.  NECK:  No bruits or JVD.  LUNGS:  Respirations are unlabored.  Clear to auscultation.  CARDIAC:  Regular S1 and S2.  No S3 or S4.  No murmurs.  ABDOMEN:  Obese, soft, nontender, and nondistended.  Bowel sounds  present x4.  EXTREMITIES:  Extremities warm and dry with trace left lower extremity  edema.  He has had a right BKA.   ACCESSORY CLINICAL  FINDINGS:  EKG shows paced rhythm with underlying  sinus rhythm.   ASSESSMENT AND PLAN:  1. CAD.  The patient appears to be doing well from a CAD standpoint      without any significant symptoms or limitations in activity.  He      remains on statin, beta-blocker, ARB and Plavix therapy.  He is      also on aspirin.  With regards to his pending of prostate biopsy,      he does not require any additional ischemic evaluation at this time      as he has been revascularized in the past 5 years without any      recurrence of symptoms.  His last cath in 2007, showed medically      manageable disease.  With regards to his Plavix therapy as it has      been 4-1/2 years since his last drug-eluting stent, it is okay for      him to come off from Plavix prior to his prostate biopsy.  It would      be in his best interest to resume it post biopsy.  2. Hypertension, stable.  3. Hyperlipidemia.  He remains on simvastatin therapy.  He had normal      LFTs on October 04, 2007.  4. Chronic mixed systolic/diastolic congestive heart failure.  The      patient appears euvolemic and remains on beta-blocker, ARB and      Lasix therapy.  5. Diabetes mellitus.  He is on glipizide and was apparently recently      started on Lantus.  He is followed by primary care.  6. Disposition.  The patient will follow up with Dr. Excell Seltzer in      approximately 6 months and is currently felt to be low-risk for      prostate biopsy.      Cristal Deer  Brion Aliment, ANP  Electronically Signed      Veverly Fells. Excell Seltzer, MD  Electronically Signed   CB/MedQ  DD: 07/12/2008  DT: 07/13/2008  Job #: [id number]

## 2011-04-13 NOTE — Assessment & Plan Note (Signed)
[facility]   NAME:  [name], [name]                  ACCOUNT NO.:  [id number]   MEDICAL RECORD NO.:  [id number]      DATE OF BIRTH:  [birth year]   PHYSICIAN:  Jonelle Sports. Sevier, M.D.  VISIT DATE:  03/20/2008                                   OFFICE VISIT   HISTORY:  This 77 year old black male with a surgically deformed left  foot.  He is being seen for a Wagner stage II diabetic foot ulcer on the  plantar aspect of that foot at the first metatarsal head area.   For the past week, he has been treated with total contact cast.  He  reports that is reasonably well tolerated, although it has given him a  little discomfort in his hip and this caused him to walk less than  usual, which is probably ideal.  He has had no pain in the foot area.  He is unaware of any breakthrough drainage.  There has been no odor, and  he has had no fever or systemic symptoms.   EXAMINATION:  Blood pressure 135/81, pulse 65, respirations 16,  temperature 98 degrees, and capillary blood glucose 122.   On the plantar aspect of the left foot at the first metatarsal head area  is an open ulcer measuring 3.0 x 1.2 x 0.2 cm which has a reasonably  granular base, slightly irregular margins, and considerable surrounding  callus.  There is no evidence of deep infection.  There is no odor.   IMPRESSION:  Continued progress of diabetic foot ulcer, left lower  extremity.   DISPOSITION:  The wound is today debrided partial thickness with removal  of the skin at the wound margins, particularly from approximately 9  o'clock to 1 o'clock, some excoriation of the wound base to stimulate  further granulation, and also removal of the callus from the periwound  area.  This was done without anesthesia and was well tolerated.  A small  amount of bleeding is dealt with silver nitrate.   The patient was then treated with an application of SilvaSorb gelAllevyn  pad and returned to total contact cast on that  extremity.  Follow up  visit will be in 1 week.           ______________________________  Jonelle Sports. Cheryll Cockayne, M.D.     RES/MEDQ  D:  03/20/2008  T:  03/21/2008  Job:  [id number]

## 2011-04-13 NOTE — Assessment & Plan Note (Signed)
[facility]   NAME:  ISAIA, HASSELL                  ACCOUNT NO.:  [id number]   MEDICAL RECORD NO.:  [id number]      DATE OF BIRTH:  Sep 20, 1929   PHYSICIAN:  Theresia Majors. Tanda Rockers, M.D. VISIT DATE:  01/11/2008                                   OFFICE VISIT   SUBJECTIVE:  Mr. Baranek is a 77 year old man whom we have followed for  Wagner 3 diabetic foot ulcer.  He has refused hyperbaric oxygen as a  therapeutic adjuvant.  He continues to wear an offloading sandal with a  silver gel used as an agent to decrease bacterial count.  There has been  no interim fever, excessive drainage or malodor.  He continues to be  ambulatory utilizing his right prosthesis.   OBJECTIVE:  Blood pressure is 154/86, respirations 18, pulse rate 61,  temperature is 98, capillary blood glucose 163 mg percent.  Inspection of the left foot on the plantar surface shows that the wound  is clean.  There is no evidence of a ascending infection, abscess, or  cellulitis.  The pedal pulse is indeterminate and consistent with his  known level of arterial insufficiency.  There is no acute ischemia.  However, the postural abnormalities of the foot are unchanged.   ASSESSMENT:  Wagner grade 3 diabetic foot ulcer.   PLAN:  We will continue offloading using a Darco healing sandal.  We  will continue the topical application of a silver gel ointment.  We will  reevaluate the patient in 2-week intervals.  The option for hyperbaric  oxygen in the management of a Wagner 3 diabetic foot ulcer remains open  for the patient's consideration.  He is persistent in his refusal to  accept the recommendation.      Harold A. Tanda Rockers, M.D.  Electronically Signed     HAN/MEDQ  D:  01/11/2008  T:  01/12/2008  Job:  [id number]

## 2011-04-13 NOTE — Assessment & Plan Note (Signed)
[facility]   NAME:  [name], [name]                  ACCOUNT NO.:  [id number]   MEDICAL RECORD NO.:  [id number]      DATE OF BIRTH:  [birth year]   PHYSICIAN:  Theresia Majors. Tanda Rockers, M.D. VISIT DATE:  10/24/2007                                   OFFICE VISIT   SUBJECTIVE:  [name] is a 77 year old man with a Wagner grade 3  diabetic foot ulcer involving the left plantar surface of the foot.  During the interim there is been no excessive drainage, malodor, pain or  fever.   OBJECTIVE:  Blood pressure is 139/72, respirations 16, pulse rate 66,  temperature 97.9, capillary blood glucose is 165 mg percent.  Inspection  of the left inferior patella shows that the wound remains open but there  is healthy-appearing granulation.  There is a sounding to the periosteum  and joint capsule at the MP joint.  There is no excessive drainage or  evidence of ascending infection or ischemia.   ASSESSMENT:  Improved Wagner grade 3 diabetic foot ulcer.   PLAN:  Will continue the offloading and moist moist dressing with  hyperbaric oxygen treatment to begin as soon as possible.      Harold A. Tanda Rockers, M.D.  Electronically Signed     HAN/MEDQ  D:  10/24/2007  T:  10/24/2007  Job:  [id number]

## 2011-04-13 NOTE — Assessment & Plan Note (Signed)
[facility]   NAME:  [name], [name]                  ACCOUNT NO.:  [id number]   MEDICAL RECORD NO.:  [id number]      DATE OF BIRTH:  11/25/1929   PHYSICIAN:  Theresia Majors. Tanda Rockers, M.D. VISIT DATE:  03/27/2008                                   OFFICE VISIT   SUBJECTIVE:  [name] is a 77 year old man, who we initially saw for  [facility] III diabetic foot ulcer.  Most recently, he has been treated with  total contact cast, and an Aleve and pad, and a silver sole.  He returns  for a followup.  There has been no pain.  He continues to be ambulatory.  He is complaining of the weight of the cast.  There has been no fever.   OBJECTIVE:  The vital signs today, blood pressure 133/78, respirations  18, pulse rate 62, temperature 98.1.  capillary blood glucose 180 mg  percent.  Inspection of the left plantar surface shows there has been  dramatic decrease in the area of the wound.  There is now  100%granulation.  There is no exposed bone, tendon, or periosteum.  We  are unable to sound the wound at all with a Q-tip.  The foot is warm.  Pedal pulses are not appreciated.  There is no evidence of concurrent  ischemia, however.  There is no evidence of cast injury.   ASSESSMENT:  Clinical improvement of a Wagner III diabetic foot ulcer as  a result of adequate offloading with total contact cast.   PLAN:  We will return the patient to total contact cast with silverel  and an Aleven and pad.  We will re-evaluate the patient in 2 weeks in  anticipation that the wound will be completely healed.      Harold A. Tanda Rockers, M.D.  Electronically Signed     HAN/MEDQ  D:  03/27/2008  T:  03/27/2008  Job:  [id number]

## 2011-04-13 NOTE — Assessment & Plan Note (Signed)
[facility]   NAME:  CHAKA, JEFFERYS                  ACCOUNT NO.:  [id number]   MEDICAL RECORD NO.:  [id number]      DATE OF BIRTH:  1929/10/28   PHYSICIAN:  Jake Shark A. Tanda Rockers, M.D. VISIT DATE:  08/08/2007                                   OFFICE VISIT   SUBJECTIVE:  Mr. Barb is a 77 year old man referred by Dr. Oliver Barre  for evaluation of an ulcer on the left hallux.   IMPRESSION:  Wagner grade 2 diabetic foot ulcer.   RECOMMENDATIONS:  Proceed with procuring custom orthotics for adequate  offloading.  Serial exams.  Interim offloading utilizing the healing  sandal with felt strips.  The patient was instructed in routine diabetic  foot care(i.e., washing with antiseptic soap, thorough drying  utilization of white socks, and daily inspection of the feet).  We will  request copies of his latest vascular evaluation.   SUBJECTIVE:  Mr. Lichtenwalner is a 77 year old man who was discharged from  [facility] on July 26, 2007.  Because of a persistent left foot ulcer  with drainage, [facility] was [facility]  management.  The wound that he prevents with today has been present for  approximately a month.  It has been associated with fever and drainage.  As a result of his admission to the hospital, he has received a course  of antibiotics with improvement manifested in decreased swelling and  warmth.  He has been followed by [facility] for non-  reconstructable vascular disease.  There has been no interim fever.  He  is a diabetic.  His most recent hemoglobin A1c was 8.3%.  His blood  sugar fasting 48 hours ago was 168.   PAST MEDICAL HISTORY:  Remarkable for hypertension, cardiomyopathy,  history of Graves disease.   CURRENT MEDICATIONS:  1. Aspirin 325 mg daily.  2. Coreg 25 mg b.i.d.  3. Avapro 300 mg daily.  4. Lipitor 440 mg h.s.  5. Protonix 40 mg daily.  6. Imdur 120 mg daily.  7. Glipizide XL 10 mg daily.  8. Synthroid 125 mg  daily.  9. Lantus.  10.Augmentin.   He is allergic to OPIATES which cause him to be nauseated.   PAST SURGERY:  Included a right below-the-knee amputation as well as  multiple amputations of the left foot.  He has had an internal  defibrillator placed.  He has had multiple cardiac catheterizations and  stents, and angioplasties performed by Dr. Sharyn Lull.   FAMILY HISTORY:  Strongly positive for diabetes, heart disease, and  stroke.  There are no cancers.   SOCIALLY:  He is single.  He lives in a shared residence.  He has adult  children, all of whom live remotely.  Socially he is retired from  [employer].   REVIEW OF SYSTEMS:  The patient has poor exercise tolerance due to  extreme dyspnea.  He denies syncope.  He denies chest pain.  He denies  angina.  His weight has been stable.  There are no GI or GU complaints.  His vision is has decreased significantly and he is being followed by  Dr. Chales Abrahams.  The remainder of review of systems is negative.   PHYSICAL EXAM:  He  is an alert, oriented man in no acute distress.  He  is in good contact with reality and responds appropriately to inquiry.  The blood pressure is 173/89, the respirations are 16, pulse rate 67,  temperature 97.8.  Capillary blood glucoses 135 mg percent.  HEENT:  Exam is clear.  The neck is supple.  Trachea is midline.  Thyroid is nonpalpable.  Lungs were clear.  The heart sounds are distant.  The abdomen is soft.  Extremity exam is remarkable for the amputation on the left lower  extremity.  There are multiple digital ray amputations of 2 through 4.  The amputation site is well-healed.  On an adducted hallux is a volar  ulcer overlying the head of the first metatarsal.  There is necrosis and  extremely thick roofing callus.  An excisional debridement was performed  of callus, necrotic tissue, and reactive granulation tissue.  There was  no extension of this wound into the joint.  The the patient is insensate  to the  [facility] filament.  There is associated 2+ edema with chronic  changes of stasis.  The pedal pulse is not palpable.   DISCUSSION:  Mr. Bacallao has a Wagner grade 2 diabetic foot ulcer.  We  will be challenged to provide him adequate offloading.  He has been seen  by the orthotist it is my understanding that he has been casted for  custom insert and an extra-depth shoe.  He is awaiting receipt of this  customized footwear.  He has in his possession a customized shoe but it  is 75 years old and the inserts have never been adjusted.  We have  explained to the patient that the inserts will require adjustment every  3-4 months dependent upon wear, and we have emphasized the necessity to  keep his footwear current to avoid trauma and reduce the risk of an  amputation.  The patient seems to understand.   We will proceed with a temporary offloading.  There is no evidence of  infection.  There is evidence of vascular compromise, but there is no  evidence of critical ischemia.  The wound environment appears to be  adequate.  We will treat as per above and reevaluate the patient in 1  week.      Harold A. Tanda Rockers, M.D.  Electronically Signed     HAN/MEDQ  D:  08/08/2007  T:  08/08/2007  Job:  [id number]   cc:   Corwin Levins, MD

## 2011-04-13 NOTE — Consult Note (Signed)
[name], [name]                  ACCOUNT NO.:  [id number]   MEDICAL RECORD NO.:  [id number]          PATIENT TYPE:  EMS   LOCATION:  ED                           FACILITY:  [facility]   PHYSICIAN:  Antonietta Breach, M.D.  DATE OF BIRTH:  04-14-1929   DATE OF CONSULTATION:  12/24/2008  DATE OF DISCHARGE:                                 CONSULTATION   REASON FOR CONSULTATION:  Psychosis, anxiety.   HISTORY OF PRESENT ILLNESS:  [name] is a 77 year old male  presenting to the [facility] with bizarre paranoid delusions.   [name] continues with a fixed false belief that he has been poisoned  by a physician who is in a conspiracy with the federal government in  order to kill him.  He also has been stating that he has been abused by  the government, having participated in fraud, and that he could be going  to prison.  These beliefs are all not grounded in reality.   [name] does not present any symptoms of depression.  He does have  intact orientation as well as memory function.   He is not combative.  When his delusions are supportively confronted  with the reason, he does not respond with anger.  However, he does state  that he no longer wants to talk.   PAST PSYCHIATRIC HISTORY:  [name] does have a history of prior  treatment.  He was at the [facility]  There  are is no known past inpatient psychiatric care and the patient is  guarded about providing any additional history.   He will not provide any psychotropic medication history.  He presented  to the [facility] with a medication list that does not include  any psychotropic agents.   FAMILY PSYCHIATRIC HISTORY:  None known.   SOCIAL HISTORY:  [name] is divorced.  He lives in an apartment with a  friend and the friend is an elderly male also and has come to the  emergency room to stay with the patient.   [name] denies any alcohol or illegal drugs.   PAST MEDICAL  HISTORY:  Graves disease, diabetes, hypertension.  He now  has hypothyroidism.  He has had a history of coronary artery disease and  a myocardial infarction.  He also has a history of a stroke.   MEDICATIONS:  1. His MAR is reviewed.  He he is on Synthroid 175 mcg daily.  2. Also some other general medical medications including aspirin.  3. Avapro.  4. Plavix,  5. Lantus.  6. Glipizide.  7. Lipitor.  8. Coreg.  9. Protonix.   LABORATORY DATA:  Sodium 140, BUN 19, creatinine 1.3, glucose 236, WBC  4.0, hemoglobin 15.6, platelet count 151,000.   ALLERGIES:  PERCOCET, PERCODAN.   OTHER LABORATORY DATA:  Alcohol negative.  SGOT 23, SGPT 26.   Drug screen is negative.   REVIEW OF SYSTEMS:  Cardiologic:  EKG on the 26 of January did show a  QTC of 515 milliseconds.  Also he does have a  pacemaker.   Other review of systems, Mr. Mcchristian does become guarded and is limited in  his history.  Other aspects of the review of systems are gathered from  the medical record.   Constitutional, head, eyes, ears, nose, throat, mouth, neurologic,  psychiatric unremarkable.   Respiratory, gastrointestinal, genitourinary, skin, musculoskeletal,  hematologic and lymphatic, endocrine metabolic all unremarkable.   EXAMINATION:  VITAL SIGNS:  Temperature is 97.5, pulse 70, respiratory  rate 20, blood pressure 171/90, O2 saturation on room air 100%.  GENERAL APPEARANCE:  [name] is an elderly male sitting up in his  wheelchair with no abnormal involuntary movements.   MENTAL STATUS EXAM:  [name] is alert and his eye contact is good.  His attention span is normal.  His concentration is also within normal  limits.  His affect is anxious.  He becomes progressively guarded as the  interview progresses and the undersigned provided ego support.  Please  see below.  His mood is mildly anxious.  He is oriented completely to  all spheres.  His memory is intact to immediate recent and remote.  His  fund  of knowledge and intelligence are within normal limits.  His speech  involves normal rate and prosody without dysarthria.  Thought process is  coherent and goal-directed.  However, there is illogia there regarding  the paranoid system.   Thought content systematic, bizarre paranoid delusion as above.  His  insight is very poor.  His judgment is impaired.  He is not combative.  He is cooperative with the interview.   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  AXIS II:  Deferred.  AXIS III:  See past medical history.  AXIS IV:  Primary support group, general medical.  AXIS V:  30.   [name] does have severely impaired judgment secondary to his  psychosis.  He would therefore be at risk for lethal self-neglect  outside of an institution.  His psychosis can potentially be reversed.   Therefore, the undersigned does recommend that he be admitted as soon as  possible to an inpatient psychiatric ward.   The undersigned did provide ego supportive psychotherapy in the  following form:  The undersigned did politely tell [name] that his  beliefs did not seem to be supported by reality.   However, at the same time the undersigned did tell [name] that if the  undersigned had those beliefs, that the undersigned would be afraid  also.   [name] continued to insist that the undersigned was incorrect with  some expression of frustration.  He also continued to be cooperative.   RECOMMENDATIONS:  1. As above.  Would admit to an inpatient psychiatric unit as soon as      possible.  2. Regarding the patient's QTC being over 500 milliseconds, will not      proceed with an antipsychotic yet.  However, would proceed with      Ativan 0.5 to 2 mg p.o., IM, or IV q.4 hours p.r.n. anxiety, given      that anxiety is part of his psychosis and a discomfort to him.  3. Regarding potential organic etiologies, [name]' mental status      exam does  not suggest an organic etiology.  However, given his age,      if there is no prior history of psychosis, would proceed with an      organic workup.  4. This would include B12,  folic acid, RPR, TSH and a cranial image.  5. Would recheck an EKG for QTC and consider consulting cardiology      regarding his prolonged QTC.  Possible reversible etiologies which      could make proceeding with an antipsychotic a lower risk.  6. In the condition of a prolonged QTC Zyprexa would be the      antipsychotic of choice.  7. As stated above the antipsychotic therapy is deferred at this time.  8. Of note, when antipsychotic therapy is started it can take up to 7-      14 days for efficacy, or longer.  Low stimulation ego support      utilizing the method described above, where the thought pattern is      politely confronted, and the emotion is validated, can help develop      a therapeutic alliance and facilitate cooperation with care as      antipsychotic medication proceeds.      Antonietta Breach, M.D.  Electronically Signed     JW/MEDQ  D:  12/24/2008  T:  12/24/2008  Job:  [id number]

## 2011-04-13 NOTE — Discharge Summary (Signed)
[name], [name]                  ACCOUNT NO.:  [id number]   MEDICAL RECORD NO.:  [id number]          PATIENT TYPE:  INP   LOCATION:  1536                         FACILITY:  [facility]   PHYSICIAN:  Rosalyn Gess. Norins, MD  DATE OF BIRTH:  1929/05/06   DATE OF ADMISSION:  07/22/2007  DATE OF DISCHARGE:  07/26/2007                               DISCHARGE SUMMARY   ADMITTING DIAGNOSIS:  1. Left foot ulcer with drainage.  2. Diabetes  3. Hypertension.  4. Cardiomyopathy with an implanted defibrillator.  5. History of Graves disease.  6. Obesity.   DISCHARGE DIAGNOSES:  1. Left foot ulcer with drainage.  2. Diabetes  3. Hypertension.  4. Cardiomyopathy with an implanted defibrillator.  5. History of Graves disease.  6. Obesity.   CONSULTANTS:  None.   PROCEDURES:  Foot x-ray which showed postsurgical degenerative changes  about the fore foot without plain film evidence of any osteomyelitis.   HISTORY OF PRESENT ILLNESS:  [name] a 77 year old African American  gentleman with multiple medical problems who presented to the emergency  department after he had increasing problem with drainage from his first  MTP joint of the left foot, the plantar aspect.  He had no fevers or  chills.  He had been seen in the emergency department 1 week prior to  admission for a foot ulcer and had been put on Bactrim.  The patient  does have a history of peripheral vascular disease and status post a  right BKA and had multiple toe amputations on his left foot.  Because of  his increasing drainage from his foot and his compromised state he was  admitted to hospital for IV antibiotics.   Past medical history, family history, social history and medications are  all well documented in the dictated admit note.   HOSPITAL COURSE:  1. Diabetic foot ulcer.  The patient was started on IV antibiotics      with Zosyn.  On this regimen the patient's foot became less      purulent.  There was no frank drainage  and it seemed to dry nicely.      The patient because of his  diabetes had no significant pain or      discomfort.  With his foot looking improved he was to switched on      July 25, 2007 to oral Augmentin for ongoing care and completion      of his antibiotic course as an outpatient.  For management of his      wounds. I would have him  be referred to the wound center for      ongoing treatment, debridement and healing.  2. Diabetes.  The patient had poor control with A1c of 8.3%.  The      patient has been followed by Dr. [name] and we will defer to Dr. [name]      in regards to changing the patient's medical regimen.  The patient's other medical problems including hypertension,  hyperlipidemia and dyspepsia were all stable during this  hospitalization.  He will continue on his  home medication.  With the patient being stable on oral antibiotics with no frank  drainage, no fevers, with a normal white blood count he was felt to be  stable and ready for discharge home.   DISCHARGE EXAMINATION:  Temperature 97.8, blood pressure 180/83, heart  rate 61, respirations 20.  GENERAL APPEARANCE:  This is an overweight African American gentleman  lying in bed in no acute distress.  CHEST:  Patient is moving air well with no rales, wheezes or rhonchi.  CARDIOVASCULAR.  Precordium quiet.  He had a regular rate and rhythm.  ABDOMEN: The patient has a ventral surgical scar.  His abdomen was  protuberant.  He had positive bowel sounds.  EXTREMITIES: The patient is status post right BKA.  The patient's left  foot was examined.  He is status post amputation of several toes and  reconstructive surgery.  He did have a ulcer at the base of the first  MTP joint which was dry with no frank drainage.   FINAL LABORATORY:  CBC from 08/24 with a hemoglobin 12.9 grams, white  count 4200, platelet count 145,000.  Final chemistries from July 24, 2007 with a sodium 136, potassium 3.7, chloride 105, CO2 26, BUN  19,  creatinine 1.42, glucose was 79.  The patient had liver functions drawn  Apr 21, 2007 which were normal with slightly low albumin at 3.3.  Hemoglobin A1c was 8.3%.  ESR was normal at 10.   DISPOSITION:  The patient is discharged home.  He will resume all his  home medications, see below.  The patient will be referred to the wound  center for care of his foot.   DISCHARGE MEDICATIONS:  1. Aspirin 325 mg daily  2. Coreg 25 mg b.i.d.  3. Avapro 300 mg daily  4. Lipitor 40 mg q.p.m.  5. Protonix 40 mg daily  6. Imdur 120 mg daily  7. Glipizide XL 10 mg daily  8. Lantus insulin 30 units subcu q.a.m.  9. Synthroid 125 mcg daily  10.Augmentin 875 mg b.i.d.   The patient is discharged to home.  His condition at time of discharge  dictation is improved and stable.      Rosalyn Gess Norins, MD  Electronically Signed     MEN/MEDQ  D:  07/26/2007  T:  07/26/2007  Job:  [id number]   cc:   Corwin Levins, MD  [address]  [place]  [place] [address]   [facility]

## 2011-04-13 NOTE — Assessment & Plan Note (Signed)
[facility]   NAME:  [name], [name]                  ACCOUNT NO.:  [id number]   MEDICAL RECORD NO.:  [id number]      DATE OF BIRTH:  1929-06-11   PHYSICIAN:  Theresia Majors. Tanda Rockers, M.D. VISIT DATE:  09/07/2007                                   OFFICE VISIT   SUBJECTIVE:  [name] is a 77 year old man whom we are following for  Wagner grade 4 diabetic foot ulcer.  In the interim the patient was  evaluated in the emergency room for relative hypotension during his last  visit.  Review of that ER visit shows that the patient was seen, he was  stable, without major medical intervention and was discharge  normotensive.  He is seen in followup.  There has been some continued  drainage from the left hallux.  There has been no fever and has been no  pain.  There has been no swelling.  He continues to be ambulatory.   OBJECTIVE:  The blood pressure is 130/67, respirations 18, pulse rate  65, temperature 98.2.  Capillary blood glucose is 135 mg percent.  Inspection of the left lower extremity shows that there is trace edema.  Previous surgical incisions are well-healed.  Wound #1 on the left  inferior plantar surface of the hallux extends down to the cortical  bone.  The granulation appears healthy and there is no debridement  needed.  There is no exudate and no drainage whatsoever.  Wound #2 on  the distal plantar surface extends full-thickness but does not involve  bone.  The pedal pulse is indeterminate.  The foot is warm.  There is no  evidence of acute vascular compromise.   ASSESSMENT:  A Wagner grade 4 diabetic foot ulcer with hammertoe  deformity of the hallux associated with previous amputations.   PLAN:  We are recommending that he proceed with hyperbaric oxygen  treatment as the treatment of choice to be combined with continued  debridement of this Wagner 4 diabetic foot ulcer.   In addition we are recommending that this patient strongly consider an  amputation of  the hallux of the left foot because of the technical  difficulty with providing offloading.  Once this digit has been removed,  we may proceed with fashioning a more appropriate orthotic appliance.  We would recommend continuing his hyperbaric oxygen preoperatively as  well as postoperatively to ensure primary healing in his vascular  compromised extremity.  This wound should respond to hyperbarics and  serial debridements, but it is likely that it would recur and it would  fail and end up requiring a below-the-knee amputation.  The elective  amputation of the hallux deformity would provide for maximum potential  to maintain independent ambulation.      Harold A. Tanda Rockers, M.D.  Electronically Signed     HAN/MEDQ  D:  09/07/2007  T:  09/08/2007  Job:  [id number]   cc:   Leonie Man, M.D.

## 2011-04-13 NOTE — Assessment & Plan Note (Signed)
[facility]   NAME:  [name], POYSER                  ACCOUNT NO.:  [id number]   MEDICAL RECORD NO.:  [id number]      DATE OF BIRTH:  [birth year]   PHYSICIAN:  Theresia Majors. Tanda Rockers, M.D. VISIT DATE:  10/19/2007                                   OFFICE VISIT   SUBJECTIVE:  [name] is a 77 year old man who we have followed for a  Wagner grade 3 diabetic foot ulcer involving the left hallux.  In the  interim, we have treated him with offloading.  He reports that there is  persistent drainage, but no fever.  He continues to be ambulatory with  the assistance of his right below-the-knee prosthesis and a four-pronged  walker.  He has had no interim hospitalizations. He continues on his  medications for cardiac and endocrine complaints.   OBJECTIVE:  Blood pressure is 152/89, respirations 80, pulse rate 77,  temperature is 98.2, capillary blood glucose is 187 mg percent.  Inspection of the left lower extremity shows a persistence of 1+ edema.  Wound #1 on the left inferior plantar surface is surrounded by a coronal  of callus.  There is a 0.8 cm of undermining.  There is no evidence of  ascending infection and excisional debridement of callus, nonviable  subcutaneous tissue was performed without difficulty on wound #1. Wound  #2 on the left superior plantar surface is resolved.   ASSESSMENT:  Persistent Wagner grade 3 diabetic foot ulcer.   PLAN:  We will proceed with hyperbaric oxygen treatment while continuing  offloading with a healing sandal and daily antiseptic soap washes.  In  preparation for his hyperbaric oxygen treatment, we will arrange for the  automatic internal cardiac defibrillator to be turned on and off prior  to and after his hyperbaric oxygen treatment.  In addition, we have  requested a letter of clearance from the manufacturer of his device.  We  will reevaluate the patient in 1 week.      Harold A. Tanda Rockers, M.D.  Electronically Signed     HAN/MEDQ  D:  10/19/2007  T:  10/19/2007  Job:  [id number]

## 2011-04-13 NOTE — Assessment & Plan Note (Signed)
[facility]   NAME:  [name], [name]                  ACCOUNT NO.:  [id number]   MEDICAL RECORD NO.:  [id number]      DATE OF BIRTH:  1929-07-16   PHYSICIAN:  Jonelle Sports. Sevier, M.D.  VISIT DATE:  04/24/2008                                   OFFICE VISIT   HISTORY:  This 77 year old black male has been followed for a plantar  ulceration at the area of the first metatarsal head of the left foot.  This had occurred in association with considerable deformity of that  foot with severe hallux valgus following a ray resection of the second  toe.   He had been managed in the total contact cast, and when seen 2 weeks  ago, was really healed, but the area was still quite vulnerable.  Accordingly, he was returned to the total contact cast and that was  continued for 2 additional weeks to allow the area to toughen.   The patient had been instructed to bring his footwear that he has  available for that foot with him today for assessment, but unfortunately  did not remember to do so.   In the interim, since his last visit, he reports no pain, no drainage,  no malodor, no fevers or systemic symptoms, and no awareness of any  problems whatsoever.   PHYSICAL EXAMINATION:  Blood pressure 120/70, pulse 62, respirations 16,  and temperature 98.0.   The previously ulcerated site on the plantar aspect of the left first  metatarsal head area has completely resolved with no open ulceration.   IMPRESSION:  Resolution of the plantar diabetic foot ulcer, left lower  extremity.   DISPOSITION:  1. The wound requires no debridement today.  2. It is dressed with an application of a Allevyn pad simply for its      protection.   He is to allow to continue home on a healing sandal and then to resume  his usual footwear.   He is to set up an appointment within the next week, then bring that  footwear in for our assessment, so that we may give recommendations  about any necessary  modifications to [facility] and send him for  consultation there.           ______________________________  Jonelle Sports. Cheryll Cockayne, M.D.     RES/MEDQ  D:  04/24/2008  T:  04/25/2008  Job:  [id number]

## 2011-04-13 NOTE — Op Note (Signed)
NAME:  [name], [name]                  ACCOUNT NO.:  [id number]   MEDICAL RECORD NO.:  [id number]          PATIENT TYPE:  AMB   LOCATION:  SDS                          FACILITY:  [facility]   PHYSICIAN:  Charles E. Fields, MD  DATE OF BIRTH:  08/17/1929   DATE OF PROCEDURE:  08/25/2007  DATE OF DISCHARGE:  08/25/2007                               OPERATIVE REPORT   PREOPERATIVE DIAGNOSIS:  Nonhealing wound, left foot.   POSTOPERATIVE DIAGNOSIS:  Nonhealing wound, left foot.   PROCEDURE:  Left lower extremity arteriogram.   ANESTHESIA:  Local.   OPERATIVE DETAILS:  After obtaining informed consent, the patient was  brought to the [facility] lab.  The patient was placed in supine position on the  angio table.  Both groins were prepped and draped in the usual sterile  fashion.  Local anesthesia was infiltrated over the right common femoral  artery.  A Majestic needle was then used to cannulate the right common  femoral artery.  There was dense scar in the subcutaneous tissues.  A  0.035 Wholey wire was then threaded into the right common femoral artery  but the wire kept passing into a side branch.  Therefore, the [facility]  wire was removed and a 0.035 J-tip guidewire threaded through the needle  up into the abdominal aorta under fluoroscopic guidance.  Next an  attempt was made to place a 5-French sheath over the guidewire in the  right common femoral artery.  However, due to the dense scar tissue this  would not advance easily.  Therefore, a 4-French end-hole catheter was  placed over the guidewire into the right common femoral artery and then  advanced up into the abdominal aorta over the guidewire.  The 0.035 J-  wire was then exchanged for a 0.035 Amplatz wire.  The 4-French end-hole  catheter was removed.  An attempt was again made to try to place a 5-  [place] sheath over the guidewire, which was unsuccessful.  Also an  attempt was made to place a 6-French dilator over the guidewire in this  also would not advance easily even after spreading with a hemostat in  the tract.  Therefore, a 5-French Omni flush catheter was brought up in  the operative field and this was advanced into the abdominal aorta over  the Amplatz wire.  This was then brought down to the level of the aortic  bifurcation.  Contrast angiogram was then performed to confirm the site  of the left common iliac artery.  The 0.035 Wholey wire was then  advanced through the flush catheter down into the common iliac and then  the external iliac artery.  The catheter was then advanced over this.  A  left lower extremity arteriogram was obtained via the left external  iliac artery.  The left external iliac artery is widely patent.  The  left common femoral artery is patent.  The profunda femoris and  superficial femoral artery are patent.  There is some calcification of  the superficial femoral artery.  The left popliteal artery was patent.  The origin of  the anterior tibial artery, tibioperoneal trunk, posterior  tibial and peroneal arteries are patent.  However, these occlude at the  proximal third of the leg.  There is no named vessel throughout the  entire left calf.  There is a remnant of the dorsalis pedis artery over  approximately 2 cm but this essentially has no outflow and is heavily  calcified.   At this point the catheter was pulled back and out and hemostasis  obtained with direct pressure.   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      [name]. Fields, MD  Electronically Signed     CEF/MEDQ  D:  08/25/2007  T:  08/26/2007  Job:  [id number]

## 2011-04-13 NOTE — Consult Note (Signed)
[name], [name]                  ACCOUNT NO.:  [id number]   MEDICAL RECORD NO.:  [id number]          PATIENT TYPE:  EMS   LOCATION:  ED                           FACILITY:  [facility]   PHYSICIAN:  Antonietta Breach, M.D.  DATE OF BIRTH:  [birth year]   DATE OF CONSULTATION:  12/24/2008  DATE OF DISCHARGE:                                 CONSULTATION   ADDENDUM:  Regarding the Ativan, would be cautious about sedation or  ataxia as side effects.      Antonietta Breach, M.D.  Electronically Signed     JW/MEDQ  D:  12/24/2008  T:  12/24/2008  Job:  [id number]

## 2011-04-14 ENCOUNTER — Ambulatory Visit (INDEPENDENT_AMBULATORY_CARE_PROVIDER_SITE_OTHER): Payer: Medicare Other | Admitting: Internal Medicine

## 2011-04-14 ENCOUNTER — Encounter: Payer: Self-pay | Admitting: Internal Medicine

## 2011-04-14 VITALS — BP 120/72 | HR 68 | Temp 98.0°F | Ht 74.0 in | Wt 287.0 lb

## 2011-04-14 DIAGNOSIS — Z0000 Encounter for general adult medical examination without abnormal findings: Secondary | ICD-10-CM

## 2011-04-14 DIAGNOSIS — I5022 Chronic systolic (congestive) heart failure: Secondary | ICD-10-CM

## 2011-04-14 DIAGNOSIS — I10 Essential (primary) hypertension: Secondary | ICD-10-CM

## 2011-04-14 DIAGNOSIS — E119 Type 2 diabetes mellitus without complications: Secondary | ICD-10-CM

## 2011-04-14 DIAGNOSIS — E039 Hypothyroidism, unspecified: Secondary | ICD-10-CM

## 2011-04-14 MED ORDER — NITROGLYCERIN 0.3 MG SL SUBL: 0.3000 mg | SUBLINGUAL_TABLET | SUBLINGUAL | Status: DC | PRN

## 2011-04-14 MED ORDER — ASPIRIN 81 MG PO TBEC: 81.0000 mg | DELAYED_RELEASE_TABLET | Freq: Every day | ORAL | Status: AC

## 2011-04-14 MED ORDER — ONETOUCH ULTRASOFT LANCETS MISC: Status: DC

## 2011-04-14 MED ORDER — POTASSIUM CHLORIDE CRYS ER 20 MEQ PO TBCR: 20.0000 meq | EXTENDED_RELEASE_TABLET | Freq: Every day | ORAL | Status: DC

## 2011-04-14 MED ORDER — INSULIN GLARGINE 100 UNIT/ML ~~LOC~~ SOLN: SUBCUTANEOUS | Status: DC

## 2011-04-14 MED ORDER — FUROSEMIDE 40 MG PO TABS: 40.0000 mg | ORAL_TABLET | Freq: Two times a day (BID) | ORAL | Status: DC

## 2011-04-14 MED ORDER — FINASTERIDE 5 MG PO TABS: 5.0000 mg | ORAL_TABLET | Freq: Every day | ORAL | Status: DC

## 2011-04-14 MED ORDER — GLUCOSE BLOOD VI STRP: ORAL_STRIP | Status: DC

## 2011-04-14 NOTE — Assessment & Plan Note (Addendum)
Difficult to control, for endo referral  - will re-do referral today, Continue all other medications as before  Lab Results  Component Value Date   HGBA1C 10.8* 03/01/2011

## 2011-04-14 NOTE — Assessment & Plan Note (Signed)
Wt increased,  And not taking the lasix 40 bid as per last d/c summary  - will ask pt to do this, cont K bid (he is doing this already), and has appt to f/u with Dr Ladona Ridgel may 22
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
## 2011-04-14 NOTE — Patient Instructions (Addendum)
Please increase the furosemide to 40 mg twice per day Please check your bottles when you get home against the medication list below - it should be the same (you should call if your bottles do not match the list) Please keep your appointments with your specialists as you have planned May 22 Dr Ladona Ridgel, and urology as planned You are given the glucometer and supplies today Please see the [facility] or scheduler today to arrange your first appt with Dr Everardo All for diabetes Please return in 6 mo with Lab testing done 3-5 days before

## 2011-04-16 ENCOUNTER — Encounter: Payer: Self-pay | Admitting: Internal Medicine

## 2011-04-16 NOTE — Cardiovascular Report (Signed)
[place]  Patient:    [name], [name]                         MRN: [id number] Proc. Date: 06/22/00 Adm. Date:  [id number] Attending:  Gwenyth Bender CC:         Lind Guest. August Saucer, M.D.             Cardiac Catheterization Lab             Osvaldo Shipper. Spruill, M.D.                        Cardiac Catheterization  PROCEDURE: 1. Left cardiac catheter with selective left and right coronary angiography,    left ventriculogram via right groin using Judkins technique. 2. Successful percutaneous transluminal coronary angioplasty to distal right    coronary artery, using 2.5 x 20-mm long CrossSail balloon.  INDICATION FOR PROCEDURE:  [name] is a 77 year old black male with a past medical history significant for coronary artery disease, history of MI status post PTCA and stenting to RCA proximal and distally in March 2001, severe hypertension, non-insulin-dependent diabetes mellitus, peripheral vascular disease, history of tobacco smoking.  He came to the ER complaining of blurring of vision associated with unsteady gait and vertigo after waking up around 8:00 a.m.  He states these symptoms lasted for approximately one hour.  He denies any weakness in the arms or legs.  Denies slurred speech, denies syncope, denies headache, denies chest pain, shortness of breath, palpitations, lightheadedness, or syncope.  Also complains of vague left arm numbness on Friday lasting for approximately half-an-hour.  States he gets occasional weak chest pain and has taken nitroglycerin in the past few weeks. Denies any fever, chills, cough, urinary symptoms.  Denies chest pain at present.  PAST MEDICAL HISTORY:  As above.  PAST SURGICAL HISTORY:  He had amputation of the dorsal left foot in the past. He had laceration of abdominal wall 20 years ago.  He had bilateral laser surgery many years ago.  ALLERGIES:  No known drug allergies.  SOCIAL HISTORY:  He is single, retired,  worked in Publishing rights manager.  Came in contact with passive smoke.  No history of active smoking.  FAMILY HISTORY:  Father died at age 60 due to MI.  Mother died at age of 52 due to diabetic complications.  One brother died of diabetic complications at the age of 1.  One brother died of MI at the age of 68.  One sister died of CA of breast.  MEDICATIONS:  He was on Imdur 60 mg p.o. q.d., Toprol XL 50 mg one-half tablet daily, enteric-coated aspirin 325 mg p.o. q.d., Plavix 75 mg p.o. q.d., Glucotrol XL 10 mg two tablets q.a.m., Altace 2.5 mg p.o. q.d., Nitrostat sublingual p.r.n.  PHYSICAL EXAMINATION:  GENERAL:  On examination, he was alert and oriented x 3.  No acute distress.  VITAL SIGNS:  Blood pressure 153/78, pulse was 74 and regular.  HEENT:  Conjunctiva was pink.  NECK:  Supple, no JVD, no bruits.  LUNGS:  Clear to auscultation without rhonchi or rales.  CARDIOVASCULAR:  S1, S2 was normal.  There was a holosystolic murmur at the apex.  EXTREMITIES:  There was no clubbing, cyanosis, or edema.  EKG done in the ER showed normal sinus rhythm, right bundle branch block pattern, old septal wall MI.  He had also new T wave  inversion in the lateral leads as compared to prior EKG.  BRIEF HOSPITAL COURSE:  The patient was admitted to telemetry unit with possible TIA, left arm numbness, rule out pulmonary insufficiency.  The patient underwent MRI of the brain which showed small vessel disease.  CT of the head was negative.  Carotid Doppler was also negative for significant lesions.  The patient underwent subsequently Persantine Cardiolite on July 23 which showed two areas of reversible ischemia in the inferior wall and anteroapical wall.  Due to recurrent chest pain in the past and recent left arm numbness and positive Persantine Cardiolite, the patient was advised for catheterization and possible angioplasty.  DESCRIPTION OF PROCEDURE:  After obtaining the informed consent,  the patient was brought to the catheterization lab and was placed on the fluoroscopy table.  The right groin was prepped and draped in the usual fashion. Xylocaine, 2%, was used for local anesthesia in the right groin.  With the help of a thin-walled needle, a 6-French arterial sheath was placed.  The sheath was aspirated and flushed.  Next, a 6-French left Judkins catheter was advanced over the wire under fluoroscopic guidance up to the ascending aorta. The wire was pulled out.  The catheter was aspirated and connected to the manifold.  The catheter was further advanced and engaged into the left coronary ostium.  Multiple views of the left system were done.  Next, the catheter was disengaged and was pulled out over the wire and was replaced with a 6-French right Judkins catheter which was advanced over the wire under fluoroscopic guidance up to the ascending aorta.  The wire was pulled out. The catheter was aspirated and connected to the manifold.  The catheter was further advanced and engaged into the right coronary ostium.  Multiple views of the right system were taken.  Next, the catheter was disengaged and was pulled out over the wire and was replaced with a 6-French pigtail catheter which was advanced over the wire under fluoroscopic guidance up to the ascending aorta.  The wire was pulled out.  The catheter was aspirated and connected to the manifold.  The catheter was further advanced up to the aortic valve and to the LV.  LV pressures were recorded.  Next, left ventriculogram was done in 30-degree RAO position.  Post angiographic pressures were recorded from LV and then pullback pressures were recorded from the aorta.  There was no gradient across the aortic valve.  Next, the pigtail catheter was pulled out over the wire.  The sheaths were aspirated and flushed.  FINDINGS: The patient has LV mild to moderately enlarged, ejection fraction of 35-40%.  1. The left main was  patent. 2. The LAD has 20-30% mid stenosis and 80% distal stenosis near the apex     which is nonsuitable for any intervention. 3. The left circumflex has 15-20% proximal stenosis and 85% distal stenosis.    The left circumflex makes an angle of 120 degrees at the left main, and the    left circumflex lesion also is very distal and it supplies a small area of    myocardium. 4. OM-1 is patent.  The ramus was 100% occluded chronically which was filling    from the right system. 5. The RCA has 20% proximal in-stent restenosis and 90% distal in-stent    restenosis.  INTERVENTIONAL PROCEDURES:  Successful percutaneous transluminal coronary angioplasty to distal right coronary artery was done using 2.5 x 15-mm long CrossSail balloon, initially with 2.5 x 20-mm long  CrossSail balloon. Multiple inflations were done up to 13 atmospheres of pressure.  The lesion was dilated from 90% to less than 10% residual with a TIMI-3 distal flow without evidence of dissection or distal embolization.  The patient received weight-adjusted heparin, ReoPro, and Plavix during the procedure.  There were no complications.  The patient tolerated the procedure well and was transferred to the ______ angioplasty unit in stable condition. DD:  06/23/00 TD:  06/23/00 Job: [id number]

## 2011-04-16 NOTE — H&P (Signed)
NAMEBRADRICK, [name]                  ACCOUNT NO.:  [id number]   MEDICAL RECORD NO.:  [id number]          PATIENT TYPE:  INP   LOCATION:  0102                         FACILITY:  [facility]   PHYSICIAN:  Renato Battles, M.D.     DATE OF BIRTH:  06/23/1929   DATE OF ADMISSION:  07/31/2005  DATE OF DISCHARGE:                                HISTORY & PHYSICAL   PRIMARY CARE PHYSICIAN:  Dr. Wynelle Link   CARDIOLOGIST:  Dr. Mayford Knife   REASON FOR ADMISSION:  Syncope.   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  He was told by observers that while he was leaning on the  counter talking to the post office employee he started drifting to the right  slowly and fell to his right side.  People caught him before he hit the  ground and they set him on a stool nearby and all the time he was staring  into space and then he started vomiting.  All this was related to patient by  observers.  He did not feel any of this.  He said prior to this happening  only unusual thing that happened to him is urge for defecation and abdominal  discomfort but no chest pain, no shortness of breath, no headache or  dizziness, no blurred vision, no nausea prior to the incident.  He does not  know how long this lasted but he said he remembers when the EMS showed up to  take him to the hospital.  I think it is about 20 minutes total or less.  Afterward the patient just came back to his normal self with no symptoms  such as postictal.  There was no seizure activity either related by  observers.   REVIEW OF SYSTEMS:  CONSTITUTIONAL SYMPTOMS:  No fevers, chills, or night  sweats.  No weight changes.  GI:  Positive for nausea and vomiting during  this event but not before or after.  Positive for chronic constipation.  Last bowel movement yesterday was very hard to get out per patient but no  blood and no melena.  GU:   No dysuria, hematuria, or retention.  CARDIOPULMONARY:  No chest pain, shortness of breath, or orthopnea.  No PND.  No cough.   PAST MEDICAL HISTORY:  1.  CAD status post PCA and stents.  2.  Ischemic cardiomyopathy.  3.  Hypertension.  4.  Peripheral vascular disease.  5.  Hypercholesterolemia.  6.  Noninsulin-dependent diabetes.   PAST SURGICAL HISTORY:  1.  Right below-the-knee amputation.  2.  Left foot surgery.  Both of these surgeries are a result of lower      extremity infections attributed to diabetes.  3.  Bilateral eye laser surgery.   FAMILY HISTORY:  Positive for CAD.   SOCIAL HISTORY:  No tobacco, alcohol, or drugs.  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  He currently lives  with a  friend of his.  He is single.   DRUG ALLERGIES:  PERCOCET, PERCODAN, and ANGIOTENSIN-CONVERTING ENZYME  INHIBITOR.  He reported that he had some reaction to insulin that made him  stop taking insulin.  I am unclear on this point.   HOME MEDICATIONS:  Patient did recall his home medications names but not  know the doses.  These doses are taken from discharge summary from prior  admission at the end of 2004.   1.  Coreg 25 mg p.o. b.i.d.  2.  Avapro 100 mg p.o. daily.  3.  Aspirin 81 mg p.o. daily.  4.  Lipitor 40 mg p.o. daily.  5.  Protonix 40 mg p.o. daily.  6.  Imdur 120 mg p.o. daily.  7.  Plavix 75 mg p.o. daily.  8.  Glipizide 10 mg p.o. daily.  9.  Metformin 1000 mg p.o. b.i.d.   PHYSICAL EXAMINATION:  GENERAL:  Patient is alert and oriented x3.  No acute  distress.  VITALS:  Temperature 96.8, heart rate 70, respiratory rate 14, blood  pressure 136/66.  HEENT:  Head is atraumatic, normocephalic.  Pupils equal, round and reactive  to light and accommodation.  Extraocular movement intact.  NECK:  No lymphadenopathy, no thyromegaly, no JVD, no carotid bruits.  CHEST:  Clear to auscultation bilaterally.  No wheezing, rales, or  rhonchi.  HEART:  Regular rate and rhythm.  The patient has an S3 but no murmurs.  ABDOMEN:  Soft, nontender, nondistended, normoactive bowel sounds.  EXTREMITIES:  No cyanosis, edema, clubbing.  NEUROLOGIC EXAM:  Grossly negative, nonfocal.   STUDIES:  CBC was within normal.  Electrolytes all within normal including  renal function.  Glucose was elevated at 239.  Liver function showed mildly  decreased albumin level at 3 otherwise normal.  Chest x-ray showed stable  cardiac enlargement.  Head CT without contrast showed no acute abnormality.  Abdominopelvic CT showed the left kidney small cyst but otherwise no  abnormality, no AAA, it is striking the amount of calcium patient has in his  aorta and abdominal vessels.   IMPRESSION:  1.  Syncope.  2.  Severe coronary artery disease.  3.  Hypertension.  4.  Diabetes.  5.  Constipation.  6.  Hypercholesterolemia.   PLAN:  1.  Admit to telemetry.  2.  Check cardiac enzymes q.8 x3.  3.  Echocardiogram.  4.  Carotid Dopplers.  5.  Continue all home medications.  6.  Use sliding scale insulin to control blood sugar.  7.  Check hemoglobin A1c and TSH.      Renato Battles, M.D.  Electronically Signed     SA/MEDQ  D:  07/31/2005  T:  07/31/2005  Job:  [id number]   cc:   Deatra James, M.D.  Fax: [phone]   Armanda Magic, M.D.  [address]  [place], [place] [address]  Fax: [phone]

## 2011-04-16 NOTE — Cardiovascular Report (Signed)
NAME:  [name], [name]                            ACCOUNT NO.:  [id number]   MEDICAL RECORD NO.:  [id number]                   PATIENT TYPE:  OIB   LOCATION:  6531                                 FACILITY:  [facility]   PHYSICIAN:  Mohan N. Sharyn Lull, M.D.              DATE OF BIRTH:  10/25/1929   DATE OF PROCEDURE:  11/19/2003  DATE OF DISCHARGE:  11/20/2003                              CARDIAC CATHETERIZATION   PROCEDURE:  1. Left cardiac catheterization with selective left coronary angiography via     right groin using Judkins technique.  2. Successful percutaneous transluminal coronary angioplasty to proximal OM-     2 using 2.5 x 9-mm long Maverick balloon.  3. Successful deployment of 3.0 x 13-mm long Cypher drug-eluting stent in     proximal OM-2.   INDICATIONS:  [name] is a 77 year old black male with past medical  history significant for multiple medical problems, i.e.; coronary artery  disease, history of myocardial infarction in the past, status post recent  PCI to subtotal LAD, ischemic cardiomyopathy, multivessel coronary artery  disease, hypertension, noninsulin dependent diabetes mellitus,  hypercholesterolemia, peripheral vascular disease, history of tobacco abuse  who complains of exertional dyspnea with minimal exertion associated with  feeling weak and tired.  Denies any nausea, vomiting or diaphoresis.  Denies  any orthopnea, leg swelling.  Denies chest pain. The patient recently  underwent percutaneous transluminal coronary angioplasty and stenting to  subtotal occluded LAD with excellent result and was noted to have 60-70% mid  bifurcation lesion in left circumflex and OM-2 as proximal 70-75% stenosis.  Distal left circumflex was very small which has 90-95% stenosis supplying  only small area of myocardium.  The patient was admitted for left cath and  possible PCI to left circumflex and OM-1.   PROCEDURE:  After obtaining informed consent, the patient was brought to  the  cath lab and was placed on fluoroscopy table. Right groin was prepped and  draped in usual fashion.  2% Xylocaine was used for local anesthesia in the  right groin.  With the help of thin-wall needle, a 7-French arterial sheath  was placed.  The sheath was aspirated and flushed.  Next, 6-French left  Judkins catheter was advanced over the wire under fluoroscopic guidance up  to the ascending aorta.  Wire was pulled out and the catheter was aspirated  and connected to the manifold. Catheter was further advanced and engaged  into left coronary ostium.  Multiple views of the left system were taken.  Next, the catheter was disengaged and was pulled out over the wire.   FINDINGS:  The LV was not done.  Left main was patent.  LAD was patent at  prior percutaneous transluminal coronary angioplasty and stented site.  Left  circumflex  had 60% bifurcation stenosis in mid portion and distally 90%  stenosis supplying small area of myocardium.  OM-1 is very,  very small which  is diffusely diseased.  OM-2 has 70-75% proximal stenosis.   INTERVENTIONAL PROCEDURE:  Successful percutaneous transluminal coronary  angioplasty to proximal OM-2 was done using 2.5 x 9-mm long Maverick balloon  for predilation and then 3.0 x 13-mm long Cypher drug-eluting stent was  deployed at 10 atmospheric pressure which was fully expanded going up to 13  atmospheric pressure.  The lesion was dilated from 70 to 35% to 0% residual  with excellent TIMI-3 distal flow without evidence of dissection or distal  embolization.  The patient received weight-based Angiomax and 300 mg of  Plavix during the procedure.  The patient tolerated the procedure well and  was transferred to recovery room in stable condition.                                              Eduardo Osier. Sharyn Lull, M.D.   MNH/MEDQ  D:  11/20/2003  T:  11/20/2003  Job:  [id number]

## 2011-04-16 NOTE — Consult Note (Signed)
[place]. [facility]  Patient:    [name], [name] Visit Number: [id number] MRN: [id number]          Service Type: MED Location: MICU [id number] Attending Physician:  Gwenyth Bender Dictated by:   Llana Aliment. Randa Evens, M.D. Proc. Date: 08/27/01 Admit Date:  08/26/2001   CC:         Minerva Areola L. August Saucer, M.D.   Consultation Report  REASON FOR CONSULTATION:  Gastrointestinal bleeding.  HISTORY OF PRESENT ILLNESS:  A 77 year old gentleman who felt weak and nearly passed out at home, came to the hospital.  He was recently discharged from the hospital following a BKA and sent home approximately nine days ago.  He had no abdominal pain, no melanic stools, no hematochezia.  He just felt weak and dizzy.  He presented to the emergency room and apparently had a melanic stool, hematemesis, coffee-ground material, and a seizure.  He was somewhat hypertensive, given fluids, and doing better.  He denies any history of previous ulcer disease, any previous melanotic stools, hematochezia, or any abdominal pain, heartburn, or dyspepsia.  CURRENT MEDICATIONS: 1. Norvasc. 2. Ecotrin. 3. Vitamins. 4. Plavix. 5. Vicodin. 6. Imdur. 7. Glucotrol. 8. Metronidazole. 9. Cipro.  ALLERGIES:  He has no known drug allergies.  MEDICAL HISTORY: 1. Diabetes with recent BK amputation for vascular disease. 2. Hypertension. 3. Myocardial infarction seven years ago.  He has had an    angioplasty and stent since then.  SOCIAL HISTORY:  He does not smoke or drink.  FAMILY HISTORY:  Positive for heart disease and diabetes.  It is negative for ulcers or GI cancer.  PHYSICAL EXAMINATION:  VITAL SIGNS:  Temperature 98.0, blood pressure 130/58, pulse 100.  GENERAL:  A pleasant black male who is a little bit sleepy, but oriented.  HEENT:  Sclerae nonicteric.  NECK:  Supple.  LUNGS:  Clear.  HEART:  Regular rate and rhythm, no murmurs or gallops.  ABDOMEN:  Soft, nontender,  nondistended.  RECTAL:  Stool was positive in the emergency room.  Rectal was not repeated.  PERTINENT LABORATORIES:  Hemoglobin on admission 5.6.  The patient has just completed three units of packed cells, repeat hemoglobin pending.  ASSESSMENT:  Gastrointestinal bleed, upper gastrointestinal bleed by history of coffee-ground emesis.  PLAN:  We will go ahead with an endoscopy this morning and we will go ahead with IV Protonix empirically.  I have discussed this with the patient.  He is agreeable.  We will plan on doing this procedure right here in the intensive care unit. Dictated by:   Llana Aliment. Randa Evens, M.D. Attending Physician:  Gwenyth Bender DD:  08/27/01 TD:  08/27/01 Job: [id number]

## 2011-04-16 NOTE — Op Note (Signed)
[place]. [facility]  Patient:    CYLER, KAPPES Visit Number: [id number] MRN: [id number]          Service Type: MED Location: [phone] Attending Physician:  Sonda Primes Dictated by:   Mardene Celeste. Lurene Shadow, M.D. Proc. Date: 08/02/01 Admit Date:  07/18/2001                             Operative Report  PREOPERATIVE DIAGNOSES:  Gangrene second toe, right foot, status post amputation great toe right foot with subsequent wound infection.  POSTOPERATIVE DIAGNOSES:  Gangrene second toe, right foot, status post amputation great toe right foot with subsequent wound infection.  PROCEDURE:  Second toe amputation right foot with wound debridement.  SURGEON:  Mardene Celeste. Lurene Shadow, M.D.  ASSISTANT:  Nurse.  ANESTHESIA:  General.  DESCRIPTION OF PROCEDURE:  The patient is a 77 year old diabetic male with severe peripheral vascular disease who underwent amputation of his right great toe previously.  He subsequently developed a MRSA infection of the right toe of the wound, which has been since opened and redebrided.  In the interim he has lost vascularity to the second toe and he is brought to the operating room today for an amputation of his second toe, and foot debridement.  PROCEDURE:  Following satisfactory anesthesia the patient was positioned supinely and the right foot prepped and draped routinely.  I made a shield type incision around the second toe, deepened this down through the extensor tendons and these were taken about at the mid shaft of the second metatarsal. I used a periosteal elevator to retract the periosteum from the metatarsal and then used a bone cutter to cut through the metatarsal.  The flexor tendons were then taken along with necrotic debris in the sole of the foot.  This was then removed in its entirety and forward for pathologic evaluation.  ______ with debridement of necrotic tissue and then the foot was also taken. All  bleeding points were treated with electrocautery. The wound was then thoroughly irrigated with multiple aliquots of normal saline.  Sterile saline soaked gauze dressing was placed over the wound followed by dry gauze dressings followed by Kerlix and a compressive Ace wrap.  The anesthetic was then reversed.  The patient was removed from the operating room to the recovery room in stable condition having tolerated the procedure well. Dictated by:   Mardene Celeste. Lurene Shadow, M.D. Attending Physician:  Sonda Primes DD:  08/02/01 TD:  08/02/01 Job: [id number]

## 2011-04-16 NOTE — Cardiovascular Report (Signed)
NAME:  BASEM, YANNUZZI                            ACCOUNT NO.:  [id number]   MEDICAL RECORD NO.:  [id number]                   PATIENT TYPE:  OIB   LOCATION:  6529                                 FACILITY:  [facility]   PHYSICIAN:  Mohan N. Sharyn Lull, M.D.              DATE OF BIRTH:  26-Feb-1929   DATE OF PROCEDURE:  09/12/2003  DATE OF DISCHARGE:  09/13/2003                              CARDIAC CATHETERIZATION   PROCEDURES:  1. Successful percutaneous transluminal coronary angioplasty to ostial and     proximal subtotal left anterior descending coronary artery using 1.5 x 15     mm long Voyager balloon and then 2.5 x 15 mm long Voyager balloon for pre-     dilatation.  2. Successful deployment of 3.0 x 33 mm long Cypher drug-eluting stent in     ostial and proximal left anterior descending coronary artery.  3. Successful post-dilatation of Cypher drug-eluting stent using 3.25 x 15     mm long PowerSail balloon.   INDICATION FOR PROCEDURE:  Mr. Daysen Gundrum is a 77 year old black male with  past medical history significant for multiple medical problems, i.e.,  history of anteroseptal wall MI in 1998, history of PTCA to LAD in 1998,  history of PTCA and stenting to RCA approximately two to three years ago,  ischemic dilated cardiomyopathy, hypertension, noninsulin-dependent diabetes  mellitus, hypercholesterolemia, peripheral vascular disease, history of  tobacco abuse, who was admitted with recurrent chest pain described as  tightness relieved with sublingual nitroglycerin and rest.  The patient was  seen in the ER a few weeks ago due to similar complaints and was discharged  home.  The patient continued to have recurrent chest pain associated with  shortness of breath and denies any nausea, vomiting, or diaphoresis,  dyspnea, orthopnea, leg swelling.  The patient underwent Persantine  Cardiolite on October 5, which showed mild anteroseptal wall ischemia with  EF of 34%.  Due to typical  anginal chest pain and positive Persantine  Cardiolite, the patient underwent left cardiac catheterization with  selective left and right coronary angiography, left ventriculography on  October 12, which showed three-vessel coronary disease although RCA lesion  does not appear to be critical.  The patient was discussed various options  of treatment, i.e., coronary artery bypass grafting for complete  revascularization versus PCI to LAD and obtuse marginal and possibly to mid-  and distal circumflex.  The patient refused for CABG and wanted to be  treated with percutaneous intervention.   DESCRIPTION OF PROCEDURE:  After obtaining the informed consent, the patient  was brought to the catheterization lab and was placed on the fluoroscopy  table.  The right groin was prepped and draped in the usual fashion.  Xylocaine 2% was used for local anesthesia in the right groin.  With the  help of thin-walled needle, a 7 French arterial sheath was placed.  The  sheath was aspirated and flushed.  Next a 7 French 3.5 Voda guiding catheter  was advanced over the wire under fluoroscopic guidance up to the ascending  aorta.  The wire was pulled out.  The catheter was aspirated and connected  to the manifold.  The catheter was further advanced and engaged into the  left coronary ostium.  Multiple views of the left system were taken.  Next  interventional procedure, successful PTCA to ostial and proximal subtotal  LAD, was done using 1.5 x 15 mm long Voyager balloon and then 2.5 x 15 mm  long Voyager balloon for pre-dilatation and then 3.0 x 33 mm long Cypher  drug-eluting stent was deployed at 10 atmospheres pressure, which was fully  expanded going up to 16 atmospheres of pressure.  The stent was post-dilated  using 3.25 x 15 mm long PowerSail balloon going up to 13 atmospheres  pressure.  The lesion was dilated from 99% to 0% residual with excellent  TIMI grade 3 distal flow without evidence of dissection  or distal  embolization.  The  patient received weight-based heparin, Integrilin, and 300 mg of Plavix  during the procedure.  The patient tolerated the procedure well.  There were  no complications.  The patient was transferred to the recovery room in  stable condition.                                                Eduardo Osier. Sharyn Lull, M.D.    MNH/MEDQ  D:  09/13/2003  T:  09/14/2003  Job:  [id number]   cc:   Cardiac Catheterization Lab

## 2011-04-16 NOTE — Op Note (Signed)
[place]. [facility]  Patient:    [name], [name]                         MRN: [id number] Proc. Date: 07/05/01 Adm. Date:  [id number] Attending:  Sonda Primes                           Operative Report  PREOPERATIVE DIAGNOSIS:  Gangrene, great toe, right foot.  POSTOPERATIVE DIAGNOSIS:  Gangrene, great toe, right foot.  PROCEDURE:  Ray amputation of great toe, right foot.  SURGEON:  Mardene Celeste. Lurene Shadow, M.D.  ASSISTANT:  Nurse.  ANESTHESIA:  General.  CLINICAL NOTE:  This patient is a 77 year old diabetic man with multiple amputations already on his left foot, with the lateral four toes having been removed.  He presents now with a gangrenous great toe on the right foot with associated osteomyelitis throughout the phalanges.  He is brought to the operating room for amputation after the risks and potential benefits of surgery are described to him.  DESCRIPTION OF PROCEDURE:  Following the induction of satisfactory anesthesia with the patient positioned supinely, the left foot is prepped and draped to be included in the sterile operative field.  The incision is made in the shield, tennis racquet-type incision encircling the toe at its base and then extending the incision up along the medial aspect of the foot to about the midlevel of the metatarsal.  Dissection was carried down laterally through the lateral tendon, then down through the extensor and flexor tendons posteriorly and anteriorly.  The digital artery medially was identified, clamped, and tied.  I used a bone cutter to transect across the midmetatarsal of the first digit and used a rongeur to remove any free edges from the bone.  The remainder of the toe was then removed in its entirety along with about one-half of the first metatarsal and forwarded for pathologic evaluation.  The wound was then debrided of all other necrotic tissues and thoroughly irrigated.  Sponge, instrument, and  sharp counts were verified.  The skin flaps were then trimmed so as to get a closure minimizing the dead space.  I placed a 7 mm Jackson-Pratt drain into the dead space and closed the wound with a single running stitch of 3-0 nylon.  Sterile dressings were then applied, anesthetic reversed, and the patient removed from the operating room to the recovery room in stable condition.  He tolerated the procedure well. DD:  07/05/01 TD:  07/06/01 Job: [id number]

## 2011-04-16 NOTE — H&P (Signed)
[place]. [facility]  Patient:    [name], [name]                         MRN: [id number] Adm. Date:  [id number] Attending:  Gwenyth Bender                         History and Physical  CHIEF COMPLAINT:  New onset fever, chills, uncontrolled diabetes.  HISTORY OF PRESENT ILLNESS:  This is one of several [facility] admissions for this 77 year old black male with longstanding history of diabetes mellitus, hypertension, and peripheral vascular disease who states he was doing well, until one day prior to admission.  The patient states he developed a chill in the afternoon with strong rigors.  This progressed during the night with increasing weakness.  He subsequently went to the emergency room for further evaluation.  He was noted to have a fever of 102 and was admitted.  His history is significant for him having undergone amputation of a digit on his left foot approximately two weeks ago.  He had been receiving home care with dressing changes as coordinated by Dr. Lurene Shadow.  He had also been on an antibiotic, questionably Tequin, at that time.  He states he took his last dose of antibiotic that morning.  The patient notably had not returned to the office for medical follow-up since being discharged from the hospital.  He states his blood sugars have been running between 200 and 250.  Compliance has been questionable on an ongoing basis.  He denies headaches, chest pains, or shortness of breath.  There has been no abdominal pain.  He denied any recent increasing pain in his foot over the past several days as well.  He has noted ongoing mild clear to yellowish drainage, however.  SOCIAL HISTORY:  The patient does not smoke or drink.  PAST MEDICAL HISTORY:  Well documented in old records.  History of coronary artery disease with him being status post angioplasty in 1996.  Status post stent placement in 1998 and 2001.  The patient is status post amputation  of digits in 2000 and approximately two weeks ago, per Dr. Lurene Shadow.  The past medical history otherwise is notable for longstanding diabetes mellitus, which has been poorly controlled in the past.  The patient does not smoke or drink as noted.  ALLERGIES:  PERCOCET which causes nausea, vomiting, and headaches.  CURRENT MEDICATIONS: 1. Plavix 75 mg q.d. 2. Imdur 30 mg q.d. 3. Norvasc 5 mg q.d. 4. Enteric-coated aspirin 81 mg q.d. 5. Glucotrol 10 mg b.i.d. 6. Avandia 4 mg q.d. 7. Altace 10 mg b.i.d.  Unclear if the patient is taking these medications at this time.  PHYSICAL EXAMINATION:  GENERAL:  He is a well-developed, well-nourished black male presently in no acute distress.  VITAL SIGNS:  Initially, at the time of presentation, he had a temperature of 102.6, blood pressure 147/61, pulse 114, respiratory rate 24.  O2 saturation 98% on room air.  Height 6 feet 2 inches, weight 264 pounds approximately.  HEENT:  Head normocephalic, atraumatic without bruits.  Extraocular muscles are intact.  Fundi non-icteric.  Grade 1 changes.  There is no sinus tenderness.  TMs with decreased light reflex.  Throat:  Posterior pharynx is clear.  NECK:  Supple.  No enlarged thyroid.  No carotid bruits appreciated.  LUNGS:  Clear without wheezes, rales, or rhonchi.  CARDIOVASCULAR:  He has a normal S1, S2.  No S3.  Presently without S4, murmurs, or rubs.  ABDOMEN:  Bowel sounds are present.  No enlarged liver or spleen, masses, or tenderness.  GENITOURINARY:  Normal circumcised male.  Bilaterally descending testes without masses.  EXTREMITIES:  Right lower extremity notable for markedly deformed toenails without associated redness or increased warmth in the right foot.  He does have decreased sensation in the distal digits.  The left leg is notable for amputation of his digits except for the big toe.  There is notably increased warmth in the foot extending to midway of the leg itself.   On the lateral aspect, he has a slow-healing incision from the previously amputated digits. The wounds are approximated but still not adhering.  The underlying dermis and musculature is being exposed.  He has clear to yellowish secretions noted. Minimal surrounding erythematous changes noted.  Decreased sensation in this region as well.  Decreased dorsalis pedis pulses bilaterally.  Skin without other active lesions.  Strength is intact.  LABORATORY DATA:  CBC revealed a WBC of 16,600, hemoglobin 12.4, hematocrit 36.1, 96 polys, 1% lymphs, 3 monocytes, 0% eosinophils.  Electrolytes:  Sodium 130, potassium 3.2, chloride 97, CO2 27, BUN 23, creatinine 1.4, glucose 272. Albumin low at 2.9, total protein 7.3, SGOT 13, SGPT 13, alkaline phosphatase 56, total bilirubin 0.8.  Urinalysis:  pH 5, specific gravity 1.031, glucose greater than 1000 mg percent, ketones 15 mg percent, leukocyte esterase negative.  IMPRESSION: 1. Fever, rule out sepsis. 2. Cellulitis of the left leg, rule out. 3. Status post amputation of digits secondary to peripheral vascular disease. 4. Diabetes mellitus, apparently uncontrolled. 5. Peripheral vascular disease, as documented. 6. History of hypertension, stable. 7. Coronary artery disease, status post angioplasty and stent placement. 8. Longstanding history of noncompliance. 9. Onychomycosis of the toes of the right foot.  PLAN:  The patient is admitted for further evaluation.  Blood cultures have been obtained.  He will be placed on IV Tequin at this time.  IV fluids for rehydration.  We will replace his potassium as well.  Sliding scale insulin regimen.  Continue wound care per Dr. Lurene Shadow.  We will place the patient back on his medications to promote adequate tissue perfusion.  Monitor response thereafter.  Further therapy pending response to the above. DD:  04/21/01 TD:  04/21/01 Job: [id number]

## 2011-04-16 NOTE — Op Note (Signed)
[place]. [facility]  Patient:    [name], [name]                         MRN: [id number] Proc. Date: 10/27/00 Adm. Date:  [id number] Attending:  Lindaann Slough CC:         Lind Guest. August Saucer, M.D.   Operative Report  PREOPERATIVE DIAGNOSIS:  Chronic balanitis.  POSTOPERATIVE DIAGNOSIS:  Chronic balanitis.  PROCEDURE:  Circumcision.  SURGEON:  Lindaann Slough, M.D.  ANESTHESIA:  Monitored anesthesia care plus local anesthesia.  INDICATION:  The patient is a 77 year old male who had been complaining of chronic irritation of his foreskin.  His foreskin was reddened.  He was treated with Mycolog ointment.  He is now scheduled for circumcision.  DESCRIPTION OF PROCEDURE:  Under monitored anesthesia care, the patient was prepped and draped and placed in the supine position.  A penile block was done with 0.25% Marcaine.  Then two circumferential incisions were made on the foreskin, and the foreskin in between those two incisions was excised. Hemostasis was secured with electrocautery.  Skin approximation was then done with 4-0 chromic.  The patient tolerated the procedure well and left the OR in satisfactory condition to postanesthesia care unit. DD:  10/27/00 TD:  10/27/00 Job: [id number]

## 2011-04-16 NOTE — Discharge Summary (Signed)
[place]. [facility]  Patient:    [name], [name]                         MRN: [id number] Adm. Date:  [id number] Disc. Date: [id number] Attending:  Tobey Bride                           Discharge Summary  DISCHARGE DIAGNOSES: 1. Cellulitis of the foot. (682.7) 2. Diabetes mellitus type 2, non-insulin-dependent. (250.02) 3. Peripheral vascular disease. (443.9) 4. Hypertension. (401.9) 5. Coronary atherosclerosis, native coronary vessels. (414.01) 6. Percutaneous transluminal coronary angioplasty, status post. (V45.82) 7. History of past noncompliance. (V15.81) 8. Amputation status of other toes. (V49.72)  PROCEDURES:  None.  HISTORY OF PRESENT ILLNESS:  This was one of several [facility] admissions for this 77 year old, African-American male with a long-standing history of diabetes mellitus, hypertension and peripheral vascular disease. He states that he was doing well until one day prior to admission.  The patient states he developed a chill in the afternoon with strong rigors.  This progressed during the night with increasing weakness.  The patient subsequently went to the emergency room for further evaluation where he was noted to have a fever of 102 and was subsequently admitted thereafter. History on admission as noted per admission H&P.  PAST MEDICAL HISTORY: 1. Long-standing history of peripheral vascular disease. 2. Status post amputation of digits of his left foot by Dr. Lurene Shadow in the    past.  HOSPITAL COURSE:  The patient was admitted for further evaluation of fever with questionable sepsis.  He was noted to have cellulitis of the left foot as well.  Notably, at the time of admission, he had a white count of 16,600 with a temperature of 102.6.  The patient underwent blood cultures.  Wound cultures were obtained as well.  He was noted to be mildly dehydrated and was started on IV fluids as well.  The patient was placed on IV Tequin.   He was also placed on his sliding scale insulin regimen.  Over the subsequent days, the patients temperature did defervesce.  His left foot and area of cellulitis did steadily improve as well.  It was felt that he would not need surgical intervention done during this hospital stay.  By May 27, he was considerably better.  He was afebrile.  The patient was ambulatory.  His foot notably had increased warmth.  The wound itself was dry without erythematous changes or oozing.  Blood sugar was found to be acceptable.  His dietary habits as well as medication compliance habits were reviewed again.  He is subsequently discharged home for further followup.  DISCHARGE MEDICATIONS:  1. Altace 10 mg p.o. q.d.  2. Glucotrol XL 10 mg q.d.  3. Toprol XL 25 mg q.d.  4. Plavix 75 mg q.a.m.  5. Norvasc 5 mg q.d.  6. Imdur 30 mg q.d.  7. Aspirin 81 mg q.d.  8. Tequin 400 mg q.d.  9. Avandia 4 mg b.i.d. 10. K-Dur 20 mEq q.d.  ACTIVITY:  The patient is to rest with elevation of his left foot.  DIET:  He will be maintained on a 4 g sodium, 1800 calorie ADA diet.  WOUND CARE:  He is to clean his wound daily with hydrogen peroxide wrapped in sterile gauze.  FOLLOWUP:  He has a follow-up appointment with Dr. Lurene Shadow.  He is to return to his  office in one weeks time. DD:  06/14/01 TD:  06/16/01 Job: [id number]

## 2011-04-16 NOTE — Op Note (Signed)
[place]. [facility]  Patient:    [name], [name] Visit Number: [id number] MRN: [id number]          Service Type: MED Location: [phone] Attending Physician:  Sonda Primes Dictated by:   Mardene Celeste. Lurene Shadow, M.D. Proc. Date: 08/08/01 Admit Date:  07/18/2001                             Operative Report  PREOPERATIVE DIAGNOSIS:  Gangrene right foot.  POSTOPERATIVE DIAGNOSIS:  Gangrene right foot.  OPERATION PERFORMED:  Right below-knee amputation.  SURGEON:  Mardene Celeste. Lurene Shadow, M.D.  ASSISTANT:  Nurse.  ANESTHESIA:  General.  INDICATIONS FOR PROCEDURE:  Mr. Hulsebus is a 77 year old diabetic man who presents with history of gangrenous changes of his great toe on the right side.  This required amputation.  Subsequently the wound dehisced and he noted to be having an infection of methicillin resistant Staphylococcus aureus. This was debrided and treated; however, with further debridement of both the second toe and additional skin and bone and tissue, the wound continued to progress with necrosis and infection.  He was brought to the operating room now for right-sided below-knee amputation.  DESCRIPTION OF PROCEDURE:  Following the induction of satisfactory anesthesia, the patient was positioned supinely.  The right leg was prepped and draped to be included in a sterile operative field.  The posterior flap type incision was made so as to place the incision approximately two fingerbreadths below the tibial tuberosity.  and continue down posteriorly with a 1gastroc flap. The anterior compartment musculature was taken with electrocautery and the posterior tibial and peroneal vessels and nerves were taken between clamps and secured with ties of 2-0 silk.  The tibia was divided approximately two fingerbreadths below the tuberosity using a Gigli saw and the fibula was divided somewhat higher up using a bone cutter.  The posterior  compartment musculature was then taken using the gastroc as a myocutaneous flap taking the soleus muscle along with the remainder of the leg.  Hemostasis was obtained and the wound closed as follows.  The posterior flap was brought anteriorly and sutured to the anterior compartment using multiple sutures of interrupted 3-0 Vicryl.  Sponge, instrument and sharp counts were verified.  The skin was closed with stainless steel staples.  A sterile compressive dressing applied. Anesthetic reversed.  Patient removed from the operating room to the recovery room in stable condition having tolerated the procedure well. Dictated by:   Mardene Celeste. Lurene Shadow, M.D. Attending Physician:  Sonda Primes DD:  08/08/01 TD:  08/08/01 Job: [id number]

## 2011-04-16 NOTE — Discharge Summary (Signed)
[place]. [facility]  Patient:    [name], [name] Visit Number: [id number] MRN: [id number]          Service Type: MED Location: [phone] Attending Physician:  Robynn Pane Dictated by:   Eduardo Osier Sharyn Lull, M.D. Admit Date:  05/09/2002 Discharge Date: 05/12/2002   CC:         Minerva Areola L. August Saucer, M.D.  Osvaldo Shipper. Spruill, M.D.   Discharge Summary  ADMISSION DIAGNOSES: 1. Atypical chest pain, rule out myocardial infarction. 2. Mild congestive heart failure, rule out myocardial infarction, rule    out ischemia. 3. Uncontrolled hypertension. 4. Non-insulin dependent diabetes mellitus. 5. Coronary artery disease. 6. History of myocardial infarction in the past. 7. Ischemic cardiomyopathy. 8. Peripheral vascular disease, status post right above the knee amputation. 9. History of tobacco inhalation.  DISCHARGE DIAGNOSES: 1. Angina pectoris, positive Persantine-Cardiolite. 2. Ischemic dilated cardiomyopathy with an EF of 28%. 3. Compensated congestive heart failure. 4. Hypertension. 5. Non-insulin dependent diabetes mellitus. 6. Peripheral vascular disease. 7. Status post right above the knee amputation. 8. History of tobacco inhalation.  DISCHARGE MEDICATION: 1. Altace 5 mg one capsule daily. 2. Imdur 120 mg one tablet daily in the morning. 3. Coreg 6.25 mg one tablet twice daily with food. 4. Plavix 75 mg one tablet daily with food. 5. Enteric coated aspirin 325 mg one tablet daily. 6. Lasix 80 mg one tablet daily. 7. K-Dur 20 meq one tablet daily. 8. Glucotrol XL 10 mg one tablet daily in the morning and 5 mg one tablet    daily at night. 9. Nitrostat 0.4 mg SL, use as directed. 10.Protonix 40 mg one tablet daily one half hour before breakfast.  ACTIVITY: Avoid heavy lifting, pushing, or pulling or any type of exertion.  DIET: Low salt, low cholesterol, 1800 calorie ADA.  SPECIAL INSTRUCTIONS: The patient has been advised to monitor blood  sugars twice daily. The patient has been advised that if he gets recurrent chest pain, should call EMS immediately.  FOLLOW-UP: With me in one week.  DISCHARGE CONDITION: Stable.  DISPOSITION: The patient is discharged against medical advice.  HISTORY OF PRESENT ILLNESS: [name] is a 77 year old black male with a past medical history significant for MI, history of CAD, status post PTCA to RCA, history of MI in the past, ischemic dilated cardiomyopathy, history of congestive heart failure, hypertension, non-insulin dependent diabetes mellitus, peripheral vascular disease, status post right AKA, and history of passive tobacco inhalation, who came to the ER complaining of shortness of breath associated with vague crampy chest pain that was associated with diaphoresis. The chest pain lasted for a few seconds. Denies any palpation, light headedness, or syncope. The patient gives a history of PND and orthopnea but denies leg swelling. Denies excessive salt intake. Denies fever, chills, cough, urinary complaints, noncompliance to any medications.  PAST MEDICAL HISTORY: As above.  PAST SURGICAL HISTORY: 1. Right AKA recently. 2. Left foot surgery many years ago. 3. Laceration of abdominal wall 20+ years ago. 4. Bilateral laser surgery of the eyes.  ALLERGIES: None known.  SOCIAL HISTORY: Single and retired. Works in a Estate agent. Known tobacco abuse.  FAMILY HISTORY: The father died of an MI at the age of 7 years. Mother died of diabetes complications at the age of 27 years. One brother died of an MI at the age of 59. One sister died of CA of the breast. One brother died of diabetic complications at the age of 58.  ADMISSION MEDICATIONS: 1. Coreg 3.125 mg q.12h. 2. Altace 2.5 mg p.o. q.d. 3. Lasix 40 mg one half p.o. q.d. 4. Imdur 60 mg p.o. q.a.m. 5. Enteric coated aspirin 325 mg p.o. q.d. 6. Glucotrol XL 10 mg p.o. q.d. 7. Flomax 0.4 mg q.h.s.  PHYSICAL  EXAMINATION:  GENERAL: Alert, awake, and oriented times three in no acute distress.  VITAL SIGNS: BP 149/74. Pulse 102. Sinus tachycardia on monitor.  HEENT: Conjunctiva pink.  NECK: Supple. Positive jugular venous distention. No bruit. He has bibasilar rales.  CARDIOVASCULAR: S1 and S2 normal. There was a soft systolic murmur and an S2 gallop.  ABDOMEN: Soft. Bowel sounds present. Nontender.  EXTREMITIES: Right AKA. On the left, there was no clubbing or cyanosis. There was trace edema.  DIAGNOSTIC STUDIES: EKG showed normal sinus rhythm. Right bundle branch block and LVH. Old septal wall MI. No ischemic changes were noted.  LABORATORY DATA: Two sets of CPKs were negative. Troponin I two sets were negative. Sodium 137, potassium 3.6, chloride 105, bicarb 24. Blood sugar 269 with BUN 17 and creatinine 1.0. Hemoglobin 14.9 and hematocrit 44.3. White count 5.2.  HOSPITAL COURSE: The patient was admitted to telemetry unit. MI was ruled out by serial enzymes and EKG. The patient was started on IV Lasix with good diuresis. The patient subsequently underwent Persantine-Cardiolite on May 11, 2002 which showed anterior apical wall ischemic with EF of 28%, which is significantly lower then the Persantine-Cardiolite done approximately three months ago which did not reveal any evidence of ischemia with an EF of 37%. Due to recurrent initial heart failure and positive Persantine-Cardiolite with significant reduction in ejection fraction, the patient was advised for left cath and possible PTCA stenting. 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 The patient wants to sign out against medical advice and will follow-up with my office early next week and will get this scheduled as an outpatient. He understands that this may cause an MI and even death.   This patient will be discharged to home against medical advice today and will be  followed up my office early next week. Dictated by:   Eduardo Osier Sharyn Lull, M.D. Attending Physician:  Robynn Pane DD:  05/12/02 TD:  05/14/02 Job: [id number]

## 2011-04-16 NOTE — Discharge Summary (Signed)
[place]. [facility]  Patient:    [name], [name]                         MRN: [id number] Adm. Date:  [id number] Disc. Date: [id number] Attending:  Rinaldo Cloud N                           Discharge Summary  FINAL DIAGNOSES:  1. 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  2. Abnormal reaction to procedure (879.8).  3. Coronary atherosclerosis of native coronary vessels (414.01).  4. Intermediate coronary syndrome (411.1).  5. Toe ulcer (707.15).  6. Status post percutaneous transluminal coronary angioplasty (B45.82).  7. Old myocardial infarction (412).  8. Hypertension (401.9).  9. Diabetes mellitus, type 2, non-insulin dependent (250.00). 10. History of noncompliance (B15.1) 11. Cerebrovascular disease (437.8). 12. Peripheral vascular disease (443.9). 13. Hypercholesterolemia (272.0). 14. Hypokalemia.  OPERATIONS AND PROCEDURES:  1. Single-vessel PTCA.  2. Left heart catheterization.  3. Coronary arteriogram.  4. Left heart angiocardiogram.  5. Injection infusion of plate inhibitor per Dr. Sharyn Lull.  HISTORY OF PRESENT ILLNESS:  [name] is a 77 year old black male with a past medical history significant for coronary artery disease status post PTCA and stent to right coronary artery February 12, 2000.  History of MI, hypertension, non-insulin-dependent diabetes mellitus, and peripheral vascular disease.  History of past tobacco smoking.  He came to the emergency room complaining of blurring of vision, weakness with unsteady gait, vertigo.  This occurred after awakening at approximately 8 a.m. on the day of admission.  The symptoms lasted for approximately one hour.  Presently, he denied weakness in the arms or legs, denied abnormal slurred speech.  He denied syncope, headache, chest pain, shortness of breath, palpitations, lightheadedness or actual syncope.   The patient also complained of vague left arm numbness on Friday,  lasting for approximately one hour.  The left arm numbness and weakness recurred today.  The patient denied fever, chills, cough, urinary symptoms.  He was seen by Dr. Sharyn Lull acutely.  The patient was subsequently admitted for further evaluation and therapy.  PAST MEDICAL HISTORY AND PHYSICAL EXAMINATION:  Per admission History & Physical.  HOSPITAL COURSE:  The patient was admitted for further evaluation of transient dizziness and weakness with left arm symptoms and question of possible TIA versus atypical coronary artery disease.  The patient was admitted to telemetry for observation.  Cardiac enzymes were obtained which were initially negative for acute MI.  He was maintained on Lopressor, nitroglycerin, Plavix, and Altace.  Blood sugars were monitored acutely as well.  On the subsequent day, the patient underwent a Persantine stress test.  The test was found to be nondiagnostic.  No new acute ischemic changes could be noted on preliminary testing.  The followup Cardiolite study did show two areas of ischemia in septal and inferobasal region.  It was felt that further evaluation would need to be pursued.  In lieu of the patients episodes of dizziness and weakness, however, MRI scan of the brain was also performed. This demonstrated evidence of mild atrophy with small vessel disease.  No acute CVA could be found.  After review of the patients risk factors, it was determined the patient would need a repeat cardiac catheterization.  Notably during this time, his dizzy spells did subside.  His blood sugars were gradually improving.  On June 22, 2000, the patient underwent  cardiac catheterization per Dr. Rinaldo Cloud.  He was found at that time to have a 90% distal in-stent restenosis of the right coronary artery.  He, therefore, underwent a PTCA per Dr. Sharyn Lull which was successful.  The patient was monitored thereafter with heparin and ReoPro.  He tolerated the procedure well.  The  patient thereafter was gradually ambulated.  He continued to do well thereafter.  During his stay, diabetes was further addressed.  He was noted to have a hemoglobin A1C of 9.3, indicative of poor control.   This was discussed with the patient in depth.  Diabetic coordinator saw the patient as well with plans for tighter outpatient followup.  The patient committed himself to closer observation and compliance.  He was noted to have peripheral vascular disease as well.  He was started on Trental for this.  By June 23, 2000, he was felt to be stable for discharge. At the time of discharge, he was having no further chest pain, dysesthesias, or vertigo symptoms.  Further followup would be pursued as an outpatient.  DISCHARGE MEDICATIONS: 1. Imdur 60 mg p.o. q.a.m. 2. Toprol XL 25 mg p.o. q.d. 3. Altace 10 mg p.o. b.i.d. 4. Trental 400 mg t.i.d. 5. Enteric-coated aspirin 325 mg p.o. q.d. 6. Plavix 75 mg q.d. 7. Glucotrol XL 10 mg p.o. q.d. 8. Tequin 400 mg q.d. for 5 days. 9. He will be maintained on a sliding scale insulin a.c.  CBGs greater than or    equal to 350, 9 units; 280 to 349, 6 units; 210 to 279, 4 units.  DIET:  He will be maintained on a 4 g sodium, 2000 calorie, ADA diet.  ACTIVITY:  No strenuous activity.  FOLLOWUP:  He is to call the office for followup appointment. DD:  08/03/00 TD:  08/04/00 Job: [id number]

## 2011-04-16 NOTE — Cardiovascular Report (Signed)
[place]. [facility]  Patient:    [name], [name]                         MRN: [id number] Proc. Date: 02/12/00 Adm. Date:  [id number] Disc. Date: [id number] Attending:  Robynn Pane CC:         Cardiac Catheterization Laboratory             Osvaldo Shipper. Spruill, M.D.             Eric L. August Saucer, M.D.             Eduardo Osier. Sharyn Lull, M.D.                        Cardiac Catheterization  PROCEDURES: 1. Left cardiac catheterization with selective right and left coronary    angiography, left ventriculography via right groin using Judkins technique. 2. Successful percutaneous transluminal coronary angioplasty to distal    right coronary artery using 2.5 x 15 mm long, CrossSail balloon. 3. Successful percutaneous transluminal coronary angioplasty to mid right coronary    artery using same 2.5 x 15 mm long, CrossSail balloon. 4. Successful deployment of 2.5 x 18 mm long, Tetra, Multi-Link stent in    distal right coronary artery. 5. Successful deployment of 4.0 x 18 mm long, Tetra, Multi-Link stent in mid    right coronary artery.  INDICATIONS FOR PROCEDURE:  [name] is a 77 year old black male with a past medical history significant for coronary artery disease, status post anteroseptal wall MI in 1996.  He had PTCA to proximal LAD in 1996, hypertension, non-insulin dependent diabetes mellitus, peripheral vascular disease, history of passive smoking for 20+ years, who came to the ER complaining of crampy, precordial chest pain while driving in the car which lasted for 10-15 minutes.  He states the pain is similar in nature to when he had an MI in 1996.  The patient also gives a history of exertional dyspnea and feeling weak, tired and fatigued after walking one to two blocks.  Denies PND, orthopnea or leg swelling.  Denies palpitations, lightheadedness or syncope.  Denies fevers, chills or cough.  Denies abdominal pain, nausea or vomiting.  ECG done in the ER  showed normal sinus rhythm with right bundle-branch block and left anterior fascicular block.  There were no _____ ischemic changes.  MI was ruled out during the hospital stay by serial enzymes.  Due to recurrent chest pain and multiple risk factors and prior history of MI, he patient was advised for left catheterization and possible angioplasty.  DESCRIPTION OF PROCEDURE:  After obtaining the informed consent, the patient was brought to the catheterization lab and was placed on the fluoroscopy table. The right groin was prepped and draped in the usual fashion.  Xylocaine 2% was used for local anesthesia in the right groin.  With the help of a thin-walled needle, a  [place] arterial sheath was placed.  The sheath was aspirated and flushed. Next, a 6 French left Judkins catheter was advanced over the wire under fluoroscopic guidance up to the ascending aorta.  The wire was pulled out.  The catheter was  aspirated and connected to the manifold.  The catheter was further advanced and  engaged into the left coronary ostium.  Multiple views of the left system were taken.  Next, the catheter was disengaged and was pulled out over the wire and as replaced with a 6  French right Judkins catheter, which was advanced over the wire under fluoroscopic guidance up to the ascending aorta.  The wire was pulled out. The catheter was aspirated and connected to the manifold.  The catheter was further advanced and engaged into the right coronary ostium.  Multiple views of the right system were taken.  Next, the catheter was disengaged and was pulled out over the wire and was replaced with a 6 French pigtail catheter which was advanced over he wire under fluoroscopic guidance up to the ascending aorta.  The wire was pulled out, the catheter was aspirated and connected to the manifold.  The catheter was further advanced across the aortic valve into the LV.  LV pressures were recorded. Next,  left ventriculography was done in 30 degree RAO position.  Post cine-graphic pressures were recorded from LV and then pullback pressures were recorded from he aorta.  There was no gradient across the aortic valve.  Next, a pigtail catheter was pulled out.  The sheaths were aspirated and flushed.  FINDINGS:  The patient had global hypokinesia, EF of 35-40%.  The left main was patent.  The LAD has mild disease at prior PTCA site.  Diagonal #1 had 99% long, tubular  stenosis starting from the ostium of LAD very close to the left main.  The length of the lesion is more than 25 mm long and then distally 100% diagonal #1 is occluded with TIMI-0 flow.  Left circumflex has 20-30% proximal stenosis and 85% distal stenosis.  The vessel is less than 1.5 mm.  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  Successful PTCA to distal RCA was done using 2.5 x 15 mm long, CrossSail balloon for pre dilatation and mid RCA.  Using same balloon for pre dilatation and then  2.5 x 18 mm long, Tetra, Multi-Link stent was deployed in the distal RCA at 8 atmospheres of pressure successfully expanded going up to 13 atmospheres of pressure and then 4.2 x 18 mm long, Tetra, Multi-Link stent deployed in mid RCA at 8 atmospheres of pressure which was fully expanded going up to 11 atmospheres of pressure.  Lesions were dilated from 70% and 95% to 0% residual with excellent IMI grade 3 distal flow without any evidence of dissection or distal embolization, nd also improved collateral flow to left system.  The patient received weight-based heparin, ReoPro and 150 mg of Plavix during the procedure.  The patient tolerated the procedure well.  There were no complications.  The patient was transferred o elective angioplasty unit for hemodynamic monitoring and anticoagulation. DD:  02/12/00 TD:  02/15/00 Job:  [id number]

## 2011-04-16 NOTE — Cardiovascular Report (Signed)
[place]. [facility]  Patient:    [name], [name] Visit Number: [id number] MRN: [id number]          Service Type: CAT Location: [facility] 2899 [id number] Attending Physician:  Robynn Pane Dictated by:   Eduardo Osier Sharyn Lull, M.D. Proc. Date: 06/07/02 Admit Date:  06/07/2002 Discharge Date: 06/07/2002   CC:         Osvaldo Shipper. Spruill, M.D.  Eric L. August Saucer, M.D.  Cardiac Catheterization Laboratory   Cardiac Catheterization  PROCEDURE: Left cardiac catheterization with selective left and right coronary angiography, LV graphy via right groin using Judkins technique.  INDICATIONS FOR PROCEDURE: The patient is a 77 year old black male with a past medical history significant for MI, history of coronary artery disease, status post PTCA and stenting to RCA in the past, systemic dilated cardiomyopathy, history of congestive heart failure, hypertension, non-insulin dependent diabetes mellitus, peripheral vascular disease, status post right above-knee amputation, history of tobacco abuse, was recently discharged from the hospital. The patient complains of vague crampy chest pain associated with sob and diaphoresis. The patient gives history of PND, orthopnea, leg swelling, also complains of exertional dyspnea and feeling weak and tired. Denies any palpitations, lightheadedness or syncope. The patient had recent Persantine Cardiolite on May 11, 2002, which showed diffuse hypokinesia with EF of 28% with inducible ischemia in the anteroapical segment. Due to chest pain, multiple risk factors and positive Persantine Cardiolite, patient was advised for catheterization, possible PTCA.  DESCRIPTION OF PROCEDURE: After obtaining the informed consent, the patient was brought to the catheterization lab and was placed on the fluoroscopy table.  The right groin was prepped and draped in the usual fashion. Xylocaine 2% was used for local anesthesia in the right groin. With the help of a  thin-walled needle, a 6 French arterial sheath was placed. The sheath was aspirated and flushed. Next, a 6 French left Judkins catheter was advanced over the wire under fluoroscopic guidance up to the ascending aorta. The wire was pulled out, the catheter was aspirated and connected to the manifold. The catheter was further advanced and engaged into left coronary ostium. Multiple views of the left system were taken. Next, the catheter was engaged and was pulled out over the wire and was replaced with 6 French right Judkins catheter which was advanced over the wire under fluoroscopic guidance up to the ascending aorta. The wire was pulled out, the catheter was aspirated and connected to the manifold. The catheter was further advanced and engaged into the right coronary ostium. Multiple views of the right system were taken. Next, the catheter was disengaged and was pulled out over the wire and was replaced with 6 French pigtail catheter which was advanced over the wire under fluoroscopic guidance up to the ascending aorta. The wire was pulled out, the catheter was aspirated and connected to the manifold. The catheter was further advanced across the aortic valve into the LV. LV pressures were recorded. Next, LV graph was done in 30-degree RAO position. Post angiographic pressures were recorded from LV and then pullback pressures were recorded from the aorta. There was no gradient across the aortic valve. Next, the pigtail catheter was pulled out, the sheaths were aspirated and flushed.  FINDINGS: LV showed global hypokinesia with severe wall mid and distal hypokinesia, EF of 35% approximately.  Left main was long, which was patent.  The LAD was 100% chronically occluded, and distally LAD was filling by RCA. The patient has two LAD systems. Diagonal #1 has  70-80% distal and 20% proximal stenosis. Beyond distal stenosis the vessel is diffusely diseased reaching to the apex. The ramus is very  small which is diffusely diseased.  The left circumflex was patent proximally and tapers down in the AV groove after giving off OM-3. OM-1 is very small. OM-2 has 30-40% sequential proximal stenosis. OM-3 has 95% stenosis proximally. Vessel is less than 2 mm which bifurcates into two branches supplying small area of myocardium. Left circumflex makes about 140 to 150 degree angle turn arising from left main and OM-3 appears to be technically difficult to reach and not suitable for PCI.  RCA has 50% mid in-stent re-stenosis and 30-40% distal in-stent re-stenosis with TIMI grade 3 distal flow. Arteriotomy was closed with Perclose without any complications. The patient tolerated the procedure well and was transferred to recovery room in stable condition. Dictated by:   Eduardo Osier Sharyn Lull, M.D. Attending Physician:  Robynn Pane DD:  06/07/02 TD:  06/10/02 Job: [id number]

## 2011-04-16 NOTE — Cardiovascular Report (Signed)
[name], [name]                  ACCOUNT NO.:  [id number]   MEDICAL RECORD NO.:  [id number]          PATIENT TYPE:  OIB   LOCATION:  2899                         FACILITY:  [facility]   PHYSICIAN:  Armanda Magic, M.D.     DATE OF BIRTH:  Aug 09, 1929   DATE OF PROCEDURE:  DATE OF DISCHARGE:                              CARDIAC CATHETERIZATION   PROCEDURE:  Left heart catheterization, coronary angiography.   OPERATOR:  Armanda Magic, M.D.   INDICATIONS:  Dilated cardiomyopathy, coronary disease and shortness of  breath.   COMPLICATIONS:  None.   IV ACCESS:  The right femoral artery 6 French sheath.   HISTORY:  This is a very pleasant 77 year old black male with a history of  diabetes mellitus, dyslipidemia, obesity and coronary disease, status post  PTCA, stenting, in the setting of an acute MI in the past, who now presents  with dyspnea on exertion, and was found to have a stress Cardiolite study  showing EF of 28%, as well as inferior infarction with periinfarction  ischemia.  He now presents for cardiac catheterization.   The patient was brought to the cardiac catheterization laboratory in the  fasting non-sedated state.  Informed consent was obtained.  Continuous heart  rate and pulse oximetry monitoring were obtained, and the patient's groin  was prepped and draped in sterile fashion.  1% Xylocaine was used for local  anesthesia.  Using the modified Seldinger technique, a 6-French sheath was  placed in the right femoral artery.  Under fluoroscopic guidance, a 6-French  JL4 catheter was placed in the left coronary artery.  Multiple cine films  were taken at the 30 degree RAO, 40 degree LAO views.  This catheter was  then exchanged out over a guidewire for a 6-French JR4 catheter, which was  placed under fluoroscopic guidance in the right coronary artery.  Multiple  cine films were taken at the 30 degree RAO, 40 degree LAO views.  This  catheter was then exchanged out of a  guidewire for a 6-French angled pigtail  catheter, which was placed under fluoroscopic guidance into the left  ventricular cavity.  Left ventriculography was not performed because of the  patient's renal insufficiency.  At the end of the procedure, all catheters  and sheaths were removed.  Manual compression was performed until adequate  hemostasis was obtained.  The patient was transferred back to his room in  stable condition.   RESULTS:  The left main coronary artery is widely patent and trifurcates  into the left anterior descending artery, ramus branch and left circumflex  artery.  The left anterior descending artery is patent throughout its course  with luminal irregularities.   The ramus branch is an extremely large branch which bifurcates distally, and  in the 2 daughter branches just prior to the bifurcation there is an 80%  stenosis of vessel.   The left circumflex is widely patent throughout its proximal and mid  portions, giving rise to a first obtuse marginal branch, which is widely  patent.  It bifurcates into 2 daughter branches.  The superior branch  has an  80% stenosis.  The inferior branch is patent.  The ongoing circumflex then  gives rise of a second obtuse marginal branch which is large and widely  patent and bifurcates into 2 daughter vessels, both of which are widely  patent.   The ongoing left circumflex gives rise to 2 further obtuse marginal  branches, but prior to the third obtuse marginal branch, it appears  subtotal.   The right coronary artery is widely patent throughout its proximal mid and  distal portions with diffuse irregularities up to 30-40%.  It then  bifurcates into the posterior descending artery and posterolateral artery.  The posterolateral artery is a fairly large vessel with a mild 70% stenosis.   Left ventriculography is not performed because of the patient's underlying  renal insufficiency.  LVEDP was 30, and the LV pressure was 161/85  mmHg,  left ventricular pressure 162/14 mmHg.   ASSESSMENT:  1.  Three vessel obstructive coronary disease.  2.  Severe ischemia and hypertensive cardiomyopathy.  3.  Hypertension.  4.  Diabetes mellitus.  5.  Dyslipidemia.   PLAN:  Review of films with Dr. Amil Amen.  Aspirin and Plavix.  Increase  Imdur to 90 mg a day.  Continue Coreg and Avapro.  IV fluid hydration.  Admit for 23-hour observation.  Questionable PCI on Apr 12, 2006.      Armanda Magic, M.D.  Electronically Signed     TT/MEDQ  D:  04/11/2006  T:  04/11/2006  Job:  [id number]   cc:   Deatra James, M.D.  Fax: [phone]

## 2011-04-16 NOTE — Discharge Summary (Signed)
NAME:  [name], [name]                            ACCOUNT NO.:  [id number]   MEDICAL RECORD NO.:  [id number]                   PATIENT TYPE:  OIB   LOCATION:  6529                                 FACILITY:  [facility]   PHYSICIAN:  Mohan N. Sharyn Lull, M.D.              DATE OF BIRTH:  22-Sep-1929   DATE OF ADMISSION:  09/10/2003  DATE OF DISCHARGE:  09/13/2003                                 DISCHARGE SUMMARY   ADMITTING DIAGNOSES:  1. Accelerated angina.  2. Positive Persantine Cardiolite.  3. Ischemic cardiomyopathy.  4. History of anteroseptal wall myocardial infarction in the past.  5. Hypertension.  6. Noninsulin-dependent diabetes mellitus.  7. Hypercholesterolemia.  8. Peripheral vascular disease.   DISCHARGE DIAGNOSES:  1. Accelerated angina.  2. Positive Persantine Cardiolite.  3. Status post percutaneous transluminal coronary angioplasty stenting to     subtotal left anterior descending.  4. Ischemic cardiomyopathy.  5. Three vessel coronary artery disease.  6. History of anteroseptal wall myocardial infarction in the past.  7. Hypertension.  8. Noninsulin-dependent diabetes mellitus.  9. Hypercholesterolemia.  10.      Peripheral vascular disease.   DISCHARGE HOME MEDICATIONS:  1. Enteric-coated aspirin 81 mg two tablets daily.  2. Plavix 75 mg one tablet daily.  3. Coreg 6.25 mg one tablet q.12h.  4. Altace 10 mg one capsule twice daily.  5. Cozaar 100 mg one tablet daily.  6. Imdur 120 mg one tablet daily in the morning.  7. Glucotrol XL 10 mg one tablet daily.  8. Avandamet 2/500 one tablet twice daily starting from September 15, 2003.  9. Nitrostat 0.4 mg sublingual; use as directed.   ACTIVITY:  Avoid heavy lifting, pushing, or pulling.   DIET:  Low-salt, low-cholesterol, 1800 calorie, ADA diet.  The patient has  been advised to monitor blood sugar.   Post-angioplasty and stent instructions have been given.   FOLLOW UP:  Follow up with me in one week.  The  patient will be scheduled  for PCI to the left circumflex system in a few weeks as an outpatient.   CONDITION ON DISCHARGE:  Stable.   BRIEF HISTORY AND HOSPITAL COURSE:  [name] is a 77 year old black  male with a past medical history significant for multiple medical problems  including coronary artery disease, history of anteroseptal wall MI in the  past, ischemic cardiomyopathy, hypertension, noninsulin-dependent diabetes  mellitus, hypercholesterolemia, peripheral vascular disease, history of  tobacco abuse.  Complains of recurrent retrosternal chest pain described as  tightness, relieved with rest and sublingual nitroglycerin.  The patient was  seen in the emergency room two weeks ago with similar complaints, and was  discharged home.  The patient continues to have recurrent chest pain  associated with shortness of breath.  Denies any nausea, vomiting,  diaphoresis.  Denies PND, orthopnea, leg swelling.  The patient underwent  Persantine Cardiolite on September 03, 2003, which showed mild anteroseptal  wall ischemia with ejection fraction of 34%.  Due to typical angina, chest  pain, and positive Persantine Cardiolite, the patient was advised to left  catheterization, possible PTCA.   PAST MEDICAL HISTORY:  As above.   PAST SURGICAL HISTORY:  1. He had a right above-knee amputation.  2. Had left foot surgery in the past.  3. Had a laceration of the abdominal wall many years ago.  4. Had bilateral laser surgery in the past.   SOCIAL HISTORY:  Single.  Retired.  Worked for cigarette factory in the  past.  No history of recent smoking or alcohol abuse.   MEDICATIONS:  1. Coreg 6.25 mg one tablet q.12h.  2. Altace 10 mg p.o. b.i.d.  3. Imdur 120 mg p.o. q.a.m.  4. Hyzaar 100/25 p.o. daily.  5. Lipitor 40 mg one tablet p.o. daily.  6. Avandamet 2/500 b.i.d.  7. Glucotrol 10 mg p.o. daily.   FAMILY HISTORY:  Positive for coronary artery disease.   PHYSICAL EXAMINATION:   GENERAL:  He is alert, awake, oriented x3, in no  acute distress.  VITAL SIGNS:  Blood pressure was 160/90, pulse 176 and regular.  HEENT:  Conjunctivae pink.  NECK:  Supple.  No JVD, no bruit.  LUNGS:  Clear to auscultation without rhonchi.  CARDIOVASCULAR:  S1 and S2 were normal.  There was no S3 gallop.  ABDOMEN:  Soft.  Bowel sounds are present.  Nontender.  EXTREMITIES:  There is no clubbing, cyanosis, or edema in the left leg.  There was right above-knee amputation.   LABORATORY DATA:  Hemoglobin was 14.1, hematocrit 41, white count 5.3.  Sodium 140, potassium 3.4, chloride 103, glucose 97, BUN 22, creatinine 1.2.  Hemoglobin A1C was 7.9.  His cholesterol was 107.  HDL was low at 30.  LDL  was 64.  Labs on September 12, 2003 revealed potassium of 3.4, glucose 110,  BUN 15, creatinine 1.1.  Post-procedure, his CPK is 153, which is in normal  range.  His potassium today is 3.3, which will be replaced prior to  discharge.   BRIEF HOSPITAL COURSE:  The patient was an a.m. admit on September 10, 2003.  The patient underwent left cardiac catheterization with selective left and  right coronary angiography as per procedure report.  The patient tolerated  the procedure well.  There were no complications.  After further reviewing  the films, and discussing it with the patient, the patient agreed for  percutaneous intervention, and refused coronary artery bypass grafting.  The  patient successfully underwent PTCA stenting to subtotal LAD as per  procedure report yesterday.  The patient tolerated the procedure well.  There were no complications.  Post-procedure, the patient did not have any  episodes of chest pain during the hospital stay.  His post-procedure cardiac  enzymes are negative.  His groin is stable.  There was no evidence of  hematoma or bruit.  His urine output is decreased.  His renal function is  stable.  DISCHARGE MEDICATIONS:  The patient will be discharged home on the above   medications.   FOLLOW UP:  He will be seen in my office in one week, and we will schedule  him for PCI to the left circumflex in a few weeks.  Eduardo Osier. Sharyn Lull, M.D.    MNH/MEDQ  D:  09/13/2003  T:  09/14/2003  Job:  [id number]

## 2011-04-16 NOTE — Op Note (Signed)
[place]  Patient:    [name], [name] Visit Number: [id number] MRN: [id number]          Service Type: MED Location: [phone] Attending Physician:  Sonda Primes Proc. Date: 07/26/01 Adm. Date:  07/18/2001                             Operative Report  PREOPERATIVE DIAGNOSIS:  Methicillin resistant Staphylococcus aureus infection of right foot, status post ray amputation, right great toe with extensive necrosis.  POSTOPERATIVE DIAGNOSIS:  Methicillin resistant Staphylococcus aureus infection of right foot, status post ray amputation, right great toe with extensive necrosis.  OPERATION PERFORMED:  Debridement of skin, muscle and bone from right foot.  SURGEON:  Mardene Celeste. Lurene Shadow, M.D.  ASSISTANT:  Nurse.  ANESTHESIA:  General.  INDICATIONS FOR PROCEDURE:  The patient is a 77 year old man who is status post sided ray amputation for gangrene over his right great toe, who subsequently developed MRSA infection and has returned to the hospital, had been treating his open wound with debridement and pulse lavage therapy.  He had a degree of necrosis that extended significantly and he is brought to the operating room now for further debridement.  DESCRIPTION OF PROCEDURE:  Following induction of satisfactory anesthesia, the right foot was prepped and draped to be included in the sterile operative field.  I debrided a large plantar surface area of necrosis which extended over to but did not penetrate the periosteum of the second toe of the foot. The necrotic tendons and muscle and soft tissue were also debrided back to the first metatarsal which had been taken at about midshaft and was debrided all the way back up to the tarsals using a rongeur.  All debridement was carried down to what appeared to be viable and bleeding tissues.  We used the pulse lavage to irrigate and debride most of the loose debris away from the foot. Hemostasis  was obtained with electrocautery and wet-to-dry dressings then applied to the open wound using saline and a sterile compressive dressing of Kerlix was applied.  Anesthetic reversed.  Patient removed from the operating room to the recovery room in stable condition having tolerated the procedure well. Attending Physician:  Sonda Primes DD:  07/26/01 TD:  07/26/01 Job: [id number]

## 2011-04-16 NOTE — Cardiovascular Report (Signed)
NAME:  LESHAUN, BIEBEL                            ACCOUNT NO.:  [id number]   MEDICAL RECORD NO.:  [id number]                   PATIENT TYPE:  OIB   LOCATION:  6529                                 FACILITY:  [facility]   PHYSICIAN:  Mohan N. Sharyn Lull, M.D.              DATE OF BIRTH:  [birth year]   DATE OF PROCEDURE:  09/10/2003  DATE OF DISCHARGE:  09/13/2003                              CARDIAC CATHETERIZATION   PROCEDURES:  Left cardiac catheterization with selective left and right  coronary angiography, left ventriculography, via right groin using Judkins  technique.   INDICATION FOR PROCEDURE:  Mr. Tandon is a 77 year old black male with past  medical history significant for multiple medical problems, i.e., coronary  artery disease, history of MI in the past, ischemic cardiomyopathy,  hypertension, noninsulin-dependent diabetes mellitus, hypercholesterolemia,  peripheral vascular disease, history of tobacco abuse, who complained of  recurrent retrosternal chest pain described as tightness, relieved with rest  and sublingual nitroglycerin.  The patient was seen in the ER a few weeks  ago with similar complaints and was discharged home.  The patient continued  to have chest pain associated with shortness of breath.  Denies any nausea,  vomiting, diaphoresis.  Denies PND, orthopnea, leg swelling.  The patient  underwent Persantine Cardiolite on October 5, which showed mild anteroseptal  wall ischemia with EF of 34%.  Due to typical anginal chest pain and  positive Persantine Cardiolite, the patient was advised for left  catheterization with possible PTCA and stenting.   DESCRIPTION OF PROCEDURE:  After obtaining the informed consent, the patient  was brought to the catheterization lab and was placed on fluoroscopy table.  The right groin was prepped and draped in the usual fashion.  Xylocaine 2%  was used for local anesthesia in the right groin.  With the help of thin-  walled needle, a 6  French arterial sheath was placed.  The sheath was  aspirated and flushed.  Next a 6 French left Judkins catheter was advanced  over the wire under fluoroscopic guidance up to the ascending aorta.  The  wire was pulled out.  The catheter was aspirated and connected to the  manifold.  The catheter was further advanced and engaged into left coronary  ostium.  Multiple views of the left system were taken.  Next the catheter  was disengaged and was pulled out over the wire and was replaced with a 6  French right Judkins catheter, which was advanced over the wire under  fluoroscopic guidance up to the ascending aorta.  The wire was pulled out.  The catheter was aspirated and connected to the manifold.  The catheter was  further advanced and engaged into right coronary ostium.  Multiple views of  the right system were taken.  Next the catheter was disengaged and was  pulled out over the wire and was replaced with 6 French pigtail catheter,  which was advanced over the wire under fluoroscopic guidance up to the  ascending aorta.  The wire was pulled out.  The catheter was aspirated and  connected to the manifold.  The catheter was further advanced across the  aortic valve into the LV.  LV pressures were recorded.  Next left  ventriculography was done in the 30 degree RAO position.  Post-angiographic  procedures were recorded from LV and then pullback pressures were recorded  from the aorta.  There was no gradient across the aortic valve.  Next the  pigtail catheter was pulled out over the wire.  Sheaths were aspirated and  flushed.   FINDINGS:  1. LV showed global hypokinesia, EF of 35-40%.  2. Left main was long, which was patent.  3. LAD has subtotal diffuse disease with TIMI-1 flow.  The ramus was large     and had 70-80% focal midstenosis.  Distally the vessel is small and     diffusely diseased.  4. Left circumflex has 20-30% proximal stenosis, 60-70% midstenosis at     bifurcation with  the OM-1 and 90% distal stenosis.  Distally the vessel     is small and diffusely diseased.  OM-1 has 60-70% midstenosis.  5. RCA has 40-50% proximal and midstenosis and 40-50% distal in-stent     restenosis.  The PDA has 80% midstenosis, which is also a very, very     small vessel.   The patient tolerated the procedure well.  There were no complications.  The  patient was transferred to recovery room in stable condition.  Due to  multiple lesions and chronically occluded LAD but anterior wall appears to  be contracting and appears to be viable.  We will discuss with patient  regarding CABG for complete revascularization.                                               Eduardo Osier. Sharyn Lull, M.D.    MNH/MEDQ  D:  09/13/2003  T:  09/14/2003  Job:  [id number]   cc:   Cardiac Catheterization Laboratory

## 2011-04-16 NOTE — Op Note (Signed)
NAMEHOSEY, BURMESTER                  ACCOUNT NO.:  [id number]   MEDICAL RECORD NO.:  [id number]          PATIENT TYPE:  AMB   LOCATION:  ENDO                         FACILITY:  [facility]   PHYSICIAN:  Jordan Hawks. Elnoria Howard, MD    DATE OF BIRTH:  29-Oct-1929   DATE OF PROCEDURE:  09/22/2004  DATE OF DISCHARGE:                                 OPERATIVE REPORT   PROCEDURE PERFORMED:  Colonoscopy.   INDICATIONS FOR PROCEDURE:  Prior left upper quadrant abdominal pain as well  as for screening.   ENDOSCOPIST:  Jordan Hawks. Elnoria Howard, MD   INSTRUMENT USED:  Olympus adult colonoscope.   PHYSICAL EXAMINATION:  CARDIAC:  Regular rate and rhythm.  LUNGS:  Clear to auscultation bilaterally.  ABDOMEN:  Obese, soft, nontender, nondistended.   MEDICATIONS GIVEN:  Versed 50 mg IV, Demerol 40 mg IV.   CONSENT:  Informed consent was obtained from the patient describing the  risks of bleeding, infection, perforation, medication reaction, a 10% miss  rate for a small colon cancer or polyp and the risk of death, all of which  are not exclusive of any other complications that can occur.   DESCRIPTION OF PROCEDURE:  After adequate sedation was achieved, a rectal  examination was performed which was negative for any palpable abnormalities.  The colonoscope was then inserted and advanced under direct visualization to  the terminal ileum.  Photodocumentation of the terminal ileum and cecum was  obtained.  The patient was noted a floppy and redundant colon and there was  moderate difficulty intubating the cecal region.  The patient was noted to  have an excellent prep.  Upon withdrawal of the colonoscope, there was no  evidence of in the cecum.  However, in the ascending colon, two 5 mm sessile  polyps were seen and removed by hot snare cautery.  Good hemostasis was  achieved.  Additionally, one to two small arteriovenous malformations were  noted in this region.  A single diverticulum was noted at the ileocecal  valve.   Upon further withdrawal of the colonoscope into the transverse colon  near the prior polypectomy site, additional 5 mm polyp was noted.  This was  removed with snare cautery.  Again good hemostasis was achieved.  No  complications encountered with the removal of the polyp.  The colonoscope  was then further withdrawn into the descending and sigmoid colon.  There was  no evidence of any masses, polyps, erosions, ulcerations, or inflammation.  The patient did exhibit an additional 2 to 3 small arteriovenous  malformations and there were a couple of scattered diverticula.  The  colonoscope was then retroflexed in the rectum and no abnormalities were  seen in this area.  The scope was then straightened and withdrawn from the  patient and the procedure was terminated.  The patient tolerated the  procedure well.  No complications were encountered.   PLAN:  1.  Follow-up biopsies.  2.  A high fiber diet.       PDH/MEDQ  D:  09/22/2004  T:  09/22/2004  Job:  [id number]  cc:   Eduardo Osier. Sharyn Lull, M.D.  [address]  [place]  [place] [address]  Fax: [phone]

## 2011-04-16 NOTE — Discharge Summary (Signed)
NAME:  JSON, KOELZER                            ACCOUNT NO.:  [id number]   MEDICAL RECORD NO.:  [id number]                   PATIENT TYPE:  INP   LOCATION:  3729                                 FACILITY:  [facility]   PHYSICIAN:  Mohan N. Sharyn Lull, M.D.              DATE OF BIRTH:  December 17, 1928   DATE OF ADMISSION:  01/07/2003  DATE OF DISCHARGE:  01/11/2003                                 DISCHARGE SUMMARY   ADMITTING DIAGNOSES:  1. Unstable angina.  Rule out myocardial infarction.  2. Ischemic cardiomyopathy.  3. Coronary artery disease.  4. History of myocardial infarction in the past.  5. Hypertension.  6. Diabetes mellitus.  7. Hypercholesterolemia.  8. Peripheral vascular disease.  9. History of passive tobacco abuse.   FINAL DIAGNOSES:  1. Status post small non-Q-wave myocardial infarction.  2. Ischemic dilated cardiomyopathy.  3. Weekly positive Persantine Cardiolyte ischemic cardiomyopathy, ejection     fraction 36%.  4. Coronary artery disease, history of myocardial infarction in the past.  5. Hypertension.  6. Noninsulin-dependent diabetes mellitus.  7. Hypercholesterolemia.  8. Peripheral vascular disease.  9. History of passive tobacco abuse.   DISCHARGE HOME MEDICATIONS:  1. Coreg 6.25 mg one tablet every twelve hours.  2. Altace 10 mg one capsule daily.  3. Hyzaar 100/25 one tablet daily.  4. Imdur 120 mg one tablet daily in the morning.  5. Lipitor 20 mg one tablet daily.  6. Enteric-coated aspirin 325 mg one tablet daily.  7. Avandia 4 mg one tablet twice daily.  8. Glucotrol XL 10 mg one tablet twice daily.  9. Nitrostat 0.4 mg sublingual use as directed.   ACTIVITY:  As tolerated.   DIET:  Low salt, low cholesterol, 1800 calorie American Diabetic Association  diet.   DISCHARGE INSTRUCTIONS:  The patient has been advised to monitor her blood  sugar as before.   FOLLOW UP:  She will follow up with me in one week.   CONDITION ON DISCHARGE:  Stable   BRIEF HISTORY:  Mr. Wilmon Conover is a 77 year old black male with past medical  history significant for coronary artery disease, history of anteroseptal  wall myocardial infarction in the past, status post percutaneous  transluminal coronary angioplasty with stenting to right coronary artery in  the past, ischemic dilated cardiomyopathy, history of congestive heart  failure, hypertension, noninsulin-dependent diabetes mellitus, peripheral  vascular disease, status post right above-knee amputation, history of  passive tobacco abuse.  He came to the emergency room complaining of  retrosternal chest pain localized grade 7 to 8 over 10 associated with mild  shortness of breath lasting for one hour.  He stated he ran of nitroglycerin  tablets, so decided to come to the emergency room.  He stated that for the  last two to three weeks, he had been having left arm pain and numbness  without any associated symptoms.  He denied  any nausea, vomiting or  diaphoresis.  He denied any orthopnea or leg swelling.  He denied cough,  fever or chills.   PAST MEDICAL HISTORY:  As above.   PAST SURGICAL HISTORY:  He had right above-knee amputation in August of  2002.  He had left foot surgery in the past, laceration of abdominal wall  twenty plus years ago and also had bilateral nasal surgery many years ago.   ALLERGIES:  No known drug allergies.   SOCIAL HISTORY:  He is single and retired.  He worked in a cigarette factory  in the past.  No history of active smoking or alcohol abuse.   FAMILY HISTORY:  Father died of myocardial infarction at the age of 38-  four.  Mother died of diabetic complications at an old age.  One brother  died of myocardial infarction at the age of 51.  One sister died of  carcinoma of breast.   MEDICATIONS AT HOME:  1. Glucotrol XL 10 mg p.o. twice daily.  2. Avandia 4 mg p.o. twice daily.  3. Hyzaar 100/25 mg p.o. daily.  4. Coreg 0.125 mg one tablet every twelve  hours.  5. Imdur 130 mg p.o. daily.  6. Altace 5 mg p.o. daily.  7. Enteric-coated aspirin one p.o. daily.  8. Lipitor 100 mg p.o. daily.   PHYSICAL EXAMINATION:  VITAL SIGNS:  On examination, he was alert and  oriented x3 in no acute distress.  Blood pressure was 125/69, pulse was 80  and regular.  HEENT:  Conjunctivae was pink.  NECK:  Supple.  No jugular venous distention.  No bruit.  LUNGS:  Were clear to auscultation without wheezes, rales or rubs.  CARDIOVASCULAR EXAMINATION:  S1 and S2 were normal.  There was no S3 gallop.  There was a soft systolic murmur at the apex.  ABDOMEN:  Was soft.  Bowel sounds were present.  Abdomen was nontender.  EXTREMITIES:  Showed no clubbing, cyanosis or edema on the left.  On the  right, he had an above-knee amputation.   LABORATORY DATA:  EKG showed normal sinus rhythm, left anterior fascicular  block, right bundle branch block, no acute ischemic changes noted plus left  ventricular hypertrophy.  His cholesterol was 127, low-density lipoprotein  of 72, high-density lipoprotein was low at 37, triglycerides 89.  Cardiac  Enzymes:  CK was 239, MB of 3.9, cardiac index 1.6, second set CK 194, MB  5.7, cardiac index, third set CK 135, MB 2.8, cardiac index 2.1.  Troponin I  was 0.03, second set was slightly elevated at .46, third set was .44, fourth  set .13.  His sodium was 137, potassium 4.7, chloride 105, bicarbonate 27,  glucose 250, blood urea nitrogen 25, creatinine 1.2.  Repeat blood sugar was  114, blood urea nitrogen 16, creatinine 1.1.  Hemoglobin was 13.7,  hematocrit 40.2, white count 4.4 with no shift to the left.   BRIEF HOSPITAL COURSE:  The patient was admitted to the telemetry unit.  The  patient ruled in for small non-Q-wave myocardial infarction by elevated  enzymes and prolonged chest pain.  The patient did not have any further episodes of chest pain during his hospital stay.  The patient subsequently  underwent Persantine  Cardiolyte on 02/11 which showed mild anteroapical wall  ischemia with an ejection fraction of 36%.  Review of the films from prior  angiogram, left anterior descending is chronically occluded which is not  suitable for percutaneous intervention or coronary artery bypass  graft.  The  patient does have tight obtuse marginal stenosis which bifurcates at the  bifurcation which is a very small vessel and also the left circumflex takes  more than 140 degree turn.  Right coronary artery had 30 to 40% in-stent  restenosis from prior angiogram.  It was discussed with the patient  regarding what his options of treatment, i.e., medical maximizing the  antianginal medication versus invasive.  The patient agreed for medical  management at present but if he continues to have recurrent chest pain, then  we will consider invasive procedure.  The patient has been ambulating in the  hallway without any problems.  Phase I cardiac rehabilitation was called.  The patient has been ambulating in the hallway without any problems.  The  patient refused cardiac rehabilitation.  The patient will be followed up in  my office in one week.                                               Eduardo Osier. Sharyn Lull, M.D.    MNH/MEDQ  D:  01/11/2003  T:  01/11/2003  Job:  [id number]

## 2011-04-16 NOTE — Op Note (Signed)
NAMEHERMILO, [name]                  ACCOUNT NO.:  [id number]   MEDICAL RECORD NO.:  [id number]          PATIENT TYPE:  OIB   LOCATION:  3729                         FACILITY:  [facility]   PHYSICIAN:  Francisca December, M.D.  DATE OF BIRTH:  [birth year]   DATE OF PROCEDURE:  05/25/2006  DATE OF DISCHARGE:                                 OPERATIVE REPORT   PROCEDURES PERFORMED:  1.  Insertion of dual chamber AICD/biventricular pacemaker for cardiac      resynchronization.  2.  Left subclavian venogram.  3.  Coronary sinus venogram.  4.  Defibrillation threshold testing.   INDICATIONS:  [name] is a 77 year old man with a known ischemic  cardiomyopathy, non-surgical candidate, who has a current ejection fraction  of 28% which is chronic.  He describes NYHA class III exertional dyspnea/CHF  symptoms.  He has a widened QRS in a left bundle branch pattern.  He is  brought to the catheterization laboratory at this time for insertion of a  prophylactic AICD with simultaneous CRT left ventricular lead placement.   PROCEDURE NOTE:  The patient is brought to the cardiac catheterization  laboratory in a fasting state.  The right groin was prepped and draped in  the usual sterile fashion.  Local anesthesia was obtained with infiltration  of 1% lidocaine with epinephrine throughout the left prepectoral region.  A  left subclavian venogram was then performed with a peripheral injection of  20 mL of Omnipaque.  A digital cineangiogram was obtained and road mapped  to guide future left subclavian puncture.  The left subclavian vein was  demonstrated to be widely patent and coursing in a normal fashion over the  anterior surface of the first rib and beneath the middle third of the  clavicle.  There is no evidence for persistence of the left superior vena  cava.   A 7-8 cm incision was then made in the deltopectoral groove and this was  carried down by sharp dissection and electrocautery to the  prepectoral  fascia. There a plane was lifted and the pocket formed inferiorly and  medially using blunt dissection and electrocautery.  The pocket was then  packed with 1% kanamycin soaked gauze.  Three separate left subclavian  punctures were then performed using an 18-gauge thin-wall needle through  which was passed and .038 inch tight J guidewire.  Two of these guide wires  were short and the third was long.  Over the initial guidewire, a 7-French  tearaway sheath and dilator were advanced.  The dilator and wire were  removed and the ventricular lead was advanced to the level of the right  atrium.  The sheath was then torn away.  Using standard technique and  fluoroscopic landmarks, the lead was manipulated into the right ventricular  apex.  There, excellent pacing parameters were obtained as will be noted  below.  This lead was tested for diaphragmatic pacing at 10 volts and none  was found.  The lead was then sutured into place using three separate 0 silk  ligatures.  Over the second short  guidewire, the second 7-French tearaway  sheath and dilator were advanced.  The dilator and wire were removed and the  atrial lead was advanced to the level of the right atrium and the sheath was  torn away.  Using standard technique and fluoroscopic landmarks, the lead  was manipulated into the right atrial appendage.  There, adequate pacing  parameters were obtained with the screw not deployed.  The screw was then  deployed and pacing parameters retested which were found to be excellent.  The lead was tested for diaphragmatic pacing at 10 volts and none was found.  The lead was then sutured in place using three separate 0 silk ligatures.  At this point, we prepared for left ventricular lead insertion.  Over the  long guidewire, 9-French tearaway sheath and dilator were advanced.  Through  this dilator, the Medtronic Attain guiding catheter was advanced over a wire  and dilator.  When the tip of  the Attain catheter arrived in the atrium, the  dilator was removed and the wire was pulled within the Attain catheter.  In  the LAO projection, the left coronary sinus was cannulated without extensive  difficulty.  However, initially the guiding catheter would only enter the  posterolateral vein.  In an attempt to re-manipulate it into the main body  of his coronary sinus, I lost position in the sinus.  It was re attained  with some difficulty but not extensive.  At this point, I left the wire out  the posterolateral vein and introduced a second wire which is a Scientist, research (medical).  This did successfully advance into the body of the coronary sinus.  The  tight J guidewire was removed and the guiding catheter was advanced into the  main of the coronary sinus over the wire, initially with the use of the  dilator.  The dilator was removed and the wire was allowed to remain in  place.  The balloon catheter was then advanced through the Attain guide  catheter.  The balloon was inflated and the catheter was flushed.  A  coronary sinus venogram in the LAO and RAO projections were obtained.  It  demonstrated the large posterolateral vein already seen.  It also  demonstrated a lack of significantly large lateral veins.  There were too  small anterolateral vein.  I elected to place the left ventricular lead in  one of these.  The 0.038 guidewire was removed and a 0.014 inch [place]  guidewire was advanced into the small anterolateral vein without extensive  difficulty.  Over this wire, the unipolar left ventricular lead was  advanced.  It was able to be advanced about halfway down the left  ventricular wall.  Adequate pacing parameters were obtained there on several  different occasions and this is reported below.  The RV to LV timing  interval was 125 milliseconds which was felt to be excellent in nature.  I,  therefore, elected to leave the left ventricular lead in the left anterolateral vein.  The guide  catheter was then removed over a slitter as  well as the removal of the tearaway sheath.  The lead position remained  stable and it was tested for adequate pacing parameters following removal of  the guide catheter.  These were unchanged.  The lead was then sutured into  place using three separate 0 silk ligatures.  The kanamycin soaked gauze was  then removed the pocket and the pocket was copiously irrigated using 1%  kanamycin solution.  The  leads were then attached to the pace shock  generator carefully identifying each by its color code and inserted into the  appropriate receptacle under the guidance of the Medtronic representative.  Each lead was tightened into place and tested for security.  The leads were  then wound beneath the pace shock generator and this was placed in the  pocket.  A #1 silk anchoring suture was applied.  The pocket was inspected  for bleeding and none was found.  At this point, we prepared for  defibrillation threshold testing.  The patient received a total of 8 mg of  midazolam and 250 mg of fentanyl.  This achieved adequate barely conscious  sedation.   Ventricular fibrillation was then induced by the shock on T method.  Ventricular fibrillation was promptly initiated and detected.  The charge  time initially was 8.8 seconds.  A 20 joules shock was delivered with 45  ohms with prompt restoration of sinus rhythm.  There were no dropouts at 1.2  mV sensitivity.  Five minutes later, the defibrillation threshold testing  was repeated.  Again, ventricular fibrillation was induced by the shock on T  method.  The ventricular fibrillation was promptly induced and detected.  Charge time was 8.5 seconds.  A 20 joules shock was delivered at 44 ohms  with prompt return of sinus rhythm.  At the second induction and threshold  testing, there were three dropouts at 1.2 mV.   The pocket was then closed using 2-0 Vicryl running fashion for the a  subcutaneous layer.  Two  layers were applied.  The skin was approximated  using 4-0 Vicryl in a running subcuticular fashion.  Steri-Strips and a  sterile dressing were applied.  The patient is transported to the recovery  area in stable condition A sense biventricular pacing.   EQUIPMENT DATA:  The AICD BiV CRT device is a Medtronic Concerto model  number D5359719, serial number [id number].  The atrial lead is Medtronic  model number [id number], serial number [id number].  The right ventricular lead is  Medtronic model number [id number], serial number [id number].  The left ventricular  lead is Medtronic model number [id number], serial number [id number].   PACING DATA:  The right ventricular lead detected a 16.1 mV R wave.  The  pacing threshold was 0.5 volts at 0.5 milliseconds pulse width.  The  impedance was 790 ohms resulting in a current at capture threshold of 1.1  MA.  The left ventricular lead detected a 9 mV R wave.  The pacing threshold was 1.4 volts at 0.8 milliseconds pulse width.  The impedance was 770 ohms  resulting in a current at capture threshold of 2.1 MA.  The atrial lead  detected a 3.3 mV P-wave.  The pacing threshold was 1.3 volts at 0.5  milliseconds pulse width.  The impedance was 539 ohms  resulting in a current at capture threshold of 3 MA. As mentioned,  diaphragmatic or left chest wall pacing was negative on all leads at 10  volts.   Defibrillation threshold testing as reported above.      Francisca December, M.D.  Electronically Signed     JHE/MEDQ  D:  05/25/2006  T:  05/25/2006  Job:  [id number]   cc:   Deatra James, M.D.  Fax: [phone]

## 2011-04-16 NOTE — H&P (Signed)
[place]  Patient:    [name], [name]                         MRN: [id number] Adm. Date:  [id number] Attending:  Sandi Raveling                         History and Physical  CHIEF COMPLAINT: Increasing leg pain and fever.  HISTORY OF PRESENT ILLNESS: The patient is a 77 year old black male with a long-standing history of diabetes mellitus and recently diagnosed coronary artery disease, who was doing well until approximately three days prior to admission.  At that time he noted onset of increasing pain in his left leg and over the past subsequent days this had progressed.  He experienced aching and diffuse arthralgias today.  When seen by a friend he was noted to be febrile with temperature greater than 101 degrees.  He had noted increasing pain in his left leg and subsequently came to the emergency room for further evaluation.  He notably had had a headache four days prior to this time, at which time he did go to the emergency room.  He did not note problems with the leg at that time.  Scan of the head per EDP was unremarkable for any acute problems.  The patient denies any leg trauma.  He has been known to have peripheral vascular disease secondary to long-standing diabetes mellitus and hypertension.  He is status post amputation of the left second, third, and fourth toes secondary to infection and peripheral vascular disease.  SOCIAL HISTORY: The patient presently does not smoke.  No alcohol.  He has most recently had problems obtaining his medications due to cost.  He notes his medications cost him alone over $300 per month.  PAST MEDICAL HISTORY:  1. As above.  2. Left flank laceration and abdominal wall 20 years ago with surgical     repair.  3. Bilateral laser surgery of eyes.  4. Non-insulin dependent diabetes mellitus.  5. Previously documented coronary artery disease, status post PTCA per     Dr. Sharyn Lull in the past.  6. His most  recent admission was on July 19, 2000 for chest pain secondary     to coronary artery disease.  CURRENT MEDICATIONS:  1. Imdur 60 mg q.d.  2. Toprol XL 25 mg q.d.  3. Altace 10 mg b.i.d.  4. Enteric-coated aspirin 325 mg q.d.  5. Plavix 75 mg q.d.  6. Glucotrol XL 10 mg in a.m.  7. Nitrostat sublingual p.r.n. chest pain.  8. Protonix 40 mg p.o. q.d.  HEALTH MAINTENANCE: He is on a low-sodium/low-cholesterol 1800 calorie ADA diet.  ALLERGIES: No known drug allergies.  PHYSICAL EXAMINATION:  GENERAL: He is a well-developed, well-nourished black male, in mild distress.  VITAL SIGNS: Blood pressure 138/76, pulse 107, respiratory rate 20, temperature 102.3 degrees.  HEENT: Head normocephalic, atraumatic.  EOMI.  Sclerae nonicteric.  No sinus tenderness.  TMs clear.  Throat membranes dry.  NECK: Supple.  No enlarged thyroid.  No carotid bruits.  LUNGS: Clear without wheezes or rales.  CARDIOVASCULAR: Normal S1 and S2.  No S3, soft S4.  A 1/6 systolic ejection murmur noted at the lower left sternal border.  ABDOMEN: Bowel sounds present.  No enlargement of liver or spleen.  No masses or tenderness.  Small ventral hernia noted.  RECTAL: Deferred.  MUSCULOSKELETAL: Full range of motion of  upper extremities.  Lower legs notable for the right lower extremity being intact with no increased warmth and negative Homans.  Absent dorsalis pedis pulses, however.  The left lower extremity is notable for the lower leg with marked erythematous changes and increased warmth along the lower leg.  He has tenderness along the superficial veins into the mid thigh region.  No definite cords are appreciated.  Notably there is decreased sensation in his toes of the left lower extremity.  The foot itself feels cool compared to the leg.  He had delayed capillary refill pressure noted.  NEUROLOGIC: He is alert and oriented to person, place, and time.  Decreased sensation in the feet bilaterally  noted.  Strength otherwise intact.  LABORATORY DATA: Laboratory data is pending.  IMPRESSION:  1. Cellulitis of the left lower leg.  2. Mild phlebitis.  3. Peripheral vascular disease of lower extremities.  4. Diabetes mellitus, with previous history of noncompliance.  5. Coronary artery disease, status post percutaneous transluminal coronary     angioplasty.  6. History of hypertension.  7. Tobacco abuse, presently abstaining per patient.  8. Situational stress with natural concerns.  9. Status post amputation of toe digits secondary to infection associated     with peripheral vascular disease.  PLAN: The patient will be admitted to telemetry for close observation.  Blood cultures x 2 have been obtained.  He will be started on Tequin 400 mg IV q.d. Monitor vital signs and control fever as appropriate.  He will be placed on a sliding-scale insulin regimen if necessary.  He will need venous Dopplers of the lower extremities in the a.m. as well.  Will place him on mini-dose heparin in the interim.  Continue on his previous medications as well as Trental 400 mg t.i.d.  Further therapy pending response to the above. DD:  08/15/00 TD:  08/16/00 Job: [id number]

## 2011-04-16 NOTE — Discharge Summary (Signed)
[place]  Patient:    [name], [name] Visit Number: [id number] MRN: [id number]          Service Type: MED Location: [phone] Attending Physician:  Gwenyth Bender Dictated by:   Mardene Celeste. Lurene Shadow, M.D. Admit Date:  08/26/2001 Discharge Date: 09/01/2001   CC:         [name], M.D. x 2   Discharge Summary  ADMITTING DIAGNOSES: 1. Methicillin-resistant Staphylococcus aureus infection of the right foot,    status post right amputation of great toe secondary to gangrene. 2. Insulin-dependent diabetes mellitus with associated retinopathy, status    post multiple amputations for peripheral vascular disease. 3. Hypertension. 4. Coronary artery disease, status post angioplasties.  DISCHARGE DIAGNOSES: 1. Methicillin-resistant Staphylococcus aureus infection of the right foot,    status post right amputation of great toe secondary to gangrene. 2. Insulin-dependent diabetes mellitus with associated retinopathy, status    post multiple amputations for peripheral vascular disease. 3. Hypertension. 4. Coronary artery disease, status post angioplasties.  PROCEDURES:  Debridement of skin, muscle, and bone from right foot for control of infection performed on July 26, 2001.  Second toe amputation right foot with wound debridement performed on August 02, 2001.  Right below knee amputation performed on August 08, 2001.  COMPLICATIONS:  Continued advancement of gangrene of foot with MRSA infection.  CONDITION ON DISCHARGE:  To rehabilitation improved.  HISTORY OF PRESENT ILLNESS:  [name] is a 77 year old diabetic man who underwent right-sided great toe amputation previously and was admitted to the hospital because of an infected wound which has been growing showing methicillin-resistant Staphylococcus aureus.  He also had some mixed cultures showing some Pseudomonas in his foot.  HOSPITAL COURSE:  He was admitted to the hospital,  placed on a sliding scale, and started on vancomycin and Cipro.  His wound was opened and debrided sharply at the bedside.  He was started on hydrotherapy for the first, however, the area of necrosis continued to extend.  He was taken back to the operating room on two separate occasions, one for a debridement of the right foot, and another from a resectional debridement including an amputation of a toe.  His condition improved briefly, but on further evaluation, he continued to have progressive infection and gangrene of his right foot.  He was subsequently then taken to the operating room where he underwent right-sided below knee amputation.  His progress thereafter was quite rapid and he was doing well.  On August 12, 2001, he was discharged and transferred to the [facility]. Dictated by:   Mardene Celeste. Lurene Shadow, M.D. Attending Physician:  Gwenyth Bender DD:  09/27/01 TD:  09/27/01 Job: [id number]

## 2011-04-16 NOTE — H&P (Signed)
NAMECLEVESTER, [name]                  ACCOUNT NO.:  [id number]   MEDICAL RECORD NO.:  [id number]          PATIENT TYPE:  OBV   LOCATION:  0103                         FACILITY:  [facility]   PHYSICIAN:  Hollice Espy, M.D.DATE OF BIRTH:  Jan 07, 1929   DATE OF ADMISSION:  01/24/2006  DATE OF DISCHARGE:                                HISTORY & PHYSICAL   PRIMARY CARE PHYSICIAN:  Dr. Wynelle Link of [facility].   CHIEF COMPLAINT:  Shortness of breath and arm numbness.   HISTORY OF PRESENT ILLNESS:  The patient is a 77 year old African-American  male with past medical history of CAD, poorly-controlled diabetes, and  hypertension who for several days has been having worsening shortness of  breath. There is no associated productive cough or wheezing but the patient  became concerned when he started having some problems with left arm  numbness. He has a previous history of  MI and stent placement. He was  concerned and came into the emergency room for further evaluation. Chest x-  ray done showed minimal pleural effusions but the patient had a normal BNP.  He received nitroglycerin and oxygen and both his breathing as well as his  arm numbness started feeling better. EKG showed no evidence of any acute ST  elevations. Cardiac enzymes showed slight elevation in the CPK level but the  MB and troponin I were normal. Currently, the patient states he has done  okay. He denies any headaches, vision changes, dysphagia, chest pain,  palpitations, shortness of breath, wheezing, coughing, abdominal pain,  hematuria, dysuria, constipation, diarrhea, or focal extremity numbness,  weakness, or pain. His review of systems is otherwise negative.   PAST MEDICAL HISTORY:  Includes:  1.  Poorly-controlled diabetes mellitus type 2.  2.  He is status post a right BKA.  3.  History of GERD.  4.  CAD with history of MI.  5.  Hypertension.  6.  He is status post radioablation for Grave's disease of the thyroid  back      in mid 2006.  7.  He also has a history of hyperlipidemia.   MEDICATIONS:  The patient is on:  1.  Avapro 300 mg p.o. daily.  2.  Glipizide 10 p.o. daily.  3.  Metformin 1000 p.o. b.i.d.  4.  Plavix 75 p.o. daily.  5.  Aspirin 81 p.o. daily.  6.  Imdur 120 p.o. daily.  7.  Protonix 40 p.o. daily.  8.  Norvasc 5 p.o. daily.   This is not confirmed; the patient is unsure of all these medications. In  addition, I see no reports or knowledge that the patient is on any type of  thyroid medicine or any type of cholesterol medication.   SOCIAL HISTORY:  The patient denies any tobacco, alcohol, or drug use.   FAMILY HISTORY:  Noted for diabetes and CAD.   ALLERGIES:  Include PERCOCET and 70/30 INSULIN.   PHYSICAL EXAMINATION:  VITAL SIGNS ON ADMISSION:  Temperature 97.1, heart  rate 58, blood pressure 136/75, respirations 22, O2 saturation 100% on room  air.  GENERAL:  The patient is alert and oriented x3 in no acute distress.  HEENT:  Normocephalic, atraumatic. His mucous membranes are moist. He has no  carotid bruits.  HEART:  Regular rate and rhythm, 2/6 systolic ejection murmur.  LUNGS:  Decreased breath sounds bibasilar.  ABDOMEN:  Soft, nontender, nondistended, positive bowel sounds.  EXTREMITIES:  He is status post a right BKA. Left lower extremity shows a 1+  pitting edema.   LABORATORY WORK:  Sodium 138, potassium 2.5, chloride 104, bicarb 28, BUN  20, creatinine 1.5, glucose 110. Albumin is 3.3, ALT 41; the rest of LFTs  are unremarkable. Coags are normal. CPK 281, MB 3.4, troponin I less than  0.05. Second set:  CPK 168, MB 1.9, troponin I less than 0.05. White count  5.4 and no shift, H&H 13.3/40.2, MCV of 88, platelet count 144. BNP normal  at 43.8.   ASSESSMENT AND PLAN:  1.  Acute shortness of breath with left arm numbness. Will admit for 24-hour      observation, for rule out any type of cardiac event. Check two more sets      of cardiac enzymes. Will  also check an arterial blood gas to make sure      that this is not any type of cardiac event. Also, the patient tells me      that he was scheduled to have a stress test about 6 months ago so will      contact [facility] for a stress test.  2.  Diabetes mellitus. Check hemoglobin A1c. Continue the patient's oral      medications, although concerning if he does have evidence of CHF if he      needs to be on metformin. Will also confirm his medications with his      PCP.  3.  Hypertension. Continue Norvasc, Imdur, Avapro.  4.  Renal insufficiency. Looks to be close to baseline.  5.  Hyperlipidemia. Check a fasting lipid profile and see if he is on any      oral medications for this.  6.  History of Grave's disease status post radioablation. Check a TSH and      free T4 to see if the patient needs to be started on Synthroid.      Hollice Espy, M.D.  Electronically Signed     SKK/MEDQ  D:  01/24/2006  T:  01/24/2006  Job:  [id number]   cc:   Deatra James, M.D.  Fax: [phone]   Armanda Magic, M.D.  Fax: [phone]

## 2011-04-16 NOTE — Discharge Summary (Signed)
[name], [name]                  ACCOUNT NO.:  [id number]   MEDICAL RECORD NO.:  [id number]          PATIENT TYPE:  INP   LOCATION:  1426                         FACILITY:  [facility]   PHYSICIAN:  Hettie Holstein, D.O.    DATE OF BIRTH:  12-Jul-1929   DATE OF ADMISSION:  07/31/2005  DATE OF DISCHARGE:  08/04/2005                                 DISCHARGE SUMMARY   PRIMARY CARE PHYSICIAN:  Dr. Wynelle Link of [facility].  Fax number:  [phone].   ADMISSION DIAGNOSIS:  Syncope.   PRINCIPAL DIAGNOSES ON DISCHARGE:  1.  Hypertriglyceridemia.  2.  Graves disease.      1.  Status post radionuclide thyroid uptake scan this admission with          evidence of 24-hour uptake at 61.3%, the normal range being 10 to          35%, consistent with Graves disease.  No dominant hyperfunctioning          nodule was identified.  The uptake was heterogenous.  The patient is          being scheduled at present for an outpatient radioablation          treatment.  He will obtain the time and place of followup from the          discharge nurse.   SECONDARY DIAGNOSES:  1.  Coronary artery disease, status post PCA and stenting.  No evidence of      acute ischemic event this admission.      1.  He did undergo a 2D echocardiogram during this hospitalization.  I          do not have these results and will request that Dr. Wynelle Link review these          results with [name].  They will be available on the [facility] E-Chart System for her review.  2.  Hypertension, poorly controlled.  Likely exacerbated by his hyperthyroid      state.  3.  Peripheral vascular disease.  4.  Hypercholesterolemia.  5.  Non-insulin-dependent diabetes.      1.  His hemoglobin A1c this admission was 8.0.   STUDIES PERFORMED THIS ADMISSION:  1.  He underwent thyroid functioning studies.  His T3 was 311 with a normal      range being from anywhere from 60 to 181.  His T4 was elevated at 4.05.      His cardiac  markers were negative.  2.  He underwent a 2D echocardiogram with the results are currently pending.  3.  His carotid studies were as well unremarkable without evidence of      internal carotid artery stenosis.  4.  He underwent a CT scan of his abdomen and pelvis as well, on July 31, 2005, in the emergency department which was unremarkable.  5.  The CT of his head in addition was unremarkable.   MEDICATIONS ON DISCHARGE:  1.  The  patient will continue Coreg 25 mg b.i.d. as before.  2.  Aspirin 81 mg daily as before.  3.  Avapro 300 mg daily as before.  4.  Lipitor 40 mg daily as before.  5.  Protonix 40 mg daily as before.  6.  Imdur 120 mg daily as before.  7.  Norvasc 5 mg daily as before.   1.  He is to confirm time and place of followup for radioablation treatment      from discharge nurse, prior to discharge.  2.  He has a followup appointment with Dr. [facility], at 2      p.m. this Friday.   HISTORY OF PRESENT ILLNESS:  For full details refer to H&P as dictated by  Dr. Karie Mainland.  However, briefly, [name] is a pleasant 77 year old African  American male who was in the post office obtaining a passport.  He was  witnessed by bystanders to have a decreased level of consciousness.  People  had felt that he was about to fall down but caught him before he fell.  He  felt tenesmus prior to this event, though denied any chest pain or shortness  of breath.  EMS brought him to the emergency department and he was  asymptomatic on initial history and physical examination.   HOSPITAL COURSE:  The patient was admitted.  He was placed on telemetry.  His electrolytes were replenished.  He was initially hypokalemic.  His  cardiac markers were unremarkable.  He underwent a carotid ultrasound and 2D  echo with results pending.  His thyroid function studies did reveal hyper  functioning.  He is presently medically stable for discharge and will follow  up for  radioablation therapy for Graves disease.      Hettie Holstein, D.O.  Electronically Signed     ESS/MEDQ  D:  08/04/2005  T:  08/04/2005  Job:  [id number]   cc:   Wynelle Link, Dr.  [facility]   Armanda Magic, M.D.  [address]  [place], [place] [address]  Fax: [phone]

## 2011-04-16 NOTE — Discharge Summary (Signed)
NAME:  [name], [name]                            ACCOUNT NO.:  [id number]   MEDICAL RECORD NO.:  [id number]                   PATIENT TYPE:  OIB   LOCATION:  6531                                 FACILITY:  [facility]   PHYSICIAN:  Mohan N. Sharyn Lull, M.D.              DATE OF BIRTH:  01/27/1929   DATE OF ADMISSION:  11/19/2003  DATE OF DISCHARGE:  11/20/2003                                 DISCHARGE SUMMARY   ADMISSION DIAGNOSES:  1. Exertional dyspnea, probably anginal equivalent.  2. Multivessel coronary artery disease.  3. History of myocardial infarction in the past.  4. Ischemic cardiomyopathy.  5. Hypertension.  6. Non-insulin-dependent diabetes mellitus.  7. Hypercholesterolemia.  8. Peripheral vascular disease.   DISCHARGE DIAGNOSES:  1. Status post exertional dyspnea, probably anginal equivalent.  2. Status post left catheterization, percutaneous transluminal cardiac     angioplasty and stenting to obtuse marginal #2.  3. Multivessel coronary artery disease.  4. History of myocardial infarction in the past.  5. Ischemic cardiomyopathy.  6. Hypertension.  7. Non-insulin-dependent diabetes mellitus.  8. Hypercholesterolemia.  9. Peripheral vascular disease.   DISCHARGE MEDICATIONS:  1. Coreg 12.5 mg 1 tablet every 12 hours.  2. Cozaar 100 mg 1 tablet daily.  3. Imdur 120 mg 1 tablet daily in the morning.  4. Baby aspirin 81 mg 2 tablets daily.  5. Plavix 75 mg 1 tablet daily with food.  6. Lipitor 40 mg 1 tablet daily.  7. Nexium 40 mg 1 capsule daily 1/2 hour before breakfast.  8. Glucotrol XL 10 mg 1 tablet daily.  9. Avandamet 2/500 mg 1 tablet twice daily starting November 22, 2003.  10.      Nitrostat 0.4 mg sublingual, use as directed.   ACTIVITY:  Avoid heavy lifting, pushing, or pulling for 48 hours.   DIET:  Low-salt, low-cholesterol, 1800 calorie ADA diet.   DISCHARGE INSTRUCTIONS:  1. The patient has been advised to monitor her blood pressure and blood  sugar regularly.  2. Post angioplasty and stent instructions have been given.  3. Follow up with me in one week.   CONDITION ON DISCHARGE:  Stable.   BRIEF HISTORY:  [name] is a 77 year old black male with past medical  significant for multiple medical problems; i.e., coronary artery disease,  history of MI in the past, status post recent PTCA and stenting to subtotal  chronically occluded LAD, ischemic cardiomyopathy, multivessel coronary  artery disease, hypertension, non-insulin-dependent diabetes mellitus,  hypercholesterolemia, peripheral vascular disease, history of tobacco abuse,  complaints of exertional dyspnea on minimal exertion associated with feeling  weak and tired.  The patient denies any nausea, vomiting, diaphoresis.  Denies PND, orthopnea, or leg swelling.  Denies chest pain.  The patient  recently underwent PTCA and stenting to chronically subtotal occluded  LAD  and was noted to have 60 to 70% bifurcation stenosis in the mid circumflex,  and OM-2 had 70% proximal stenosis.  Distally, left circumflex had 90 to 95%  stenosis.  The patient was admitted for left catheterization and possible  percutaneous coronary intervention to left circumflex and OM-2.   PAST MEDICAL HISTORY:  As above.   PAST SURGICAL HISTORY:  1. Right above-knee amputation in the past.  2. Left foot surgery.  3. Laceration of abdominal wall.  4. Bilateral laser surgery of the eyes in the past.   SOCIAL HISTORY:  Single, retired, worked for cigarette factory in the past.  No history of smoking or alcohol abuse.   ALLERGIES:  Intolerance to ACE.   HOME MEDICATIONS:  1. Coreg 12.5 mg p.o. q.12h.  2. Hyzaar 100/25 mg p.o. daily.  3. Imdur 120 mg p.o. q.a.m.  4. Enteric-coated aspirin 81 mg p.o. daily.  5. Plavix 75 mg p.o. daily.  6. Lipitor 40 mg p.o. daily.  7. Nitrostat sublingually p.r.n.  8. Toprol XL 10 mg p.o. daily.  9. Avandamet 2/500 p.o. b.i.d.   FAMILY HISTORY:   Positive for coronary artery disease.   PHYSICAL EXAMINATION:  GENERAL:  Alert, awake, oriented x 3.  VITAL SIGNS:  Blood pressure 150/80, pulse 66.  HEENT:  Conjunctivae pink.  NECK:  Supple.  No JVD, no bruit.  LUNGS:  Clear to auscultation without rhonchi or rales.  CARDIOVASCULAR:  S1, S2 normal.  There was a soft systolic murmur.  There  was no S3 gallop.  ABDOMEN:  Soft.  Bowel sounds present.  Nontender.  EXTREMITIES:  Right above-knee amputation.  Left negative clubbing,  cyanosis, or edema.   HOSPITAL COURSE:  The patient was a.m. admit and underwent left cardiac  catheterization with PTCA and stenting to OM-2 without any complications.  The patient tolerated the procedure well.  Postprocedure, the patient did  not have any episode of chest pain. During hospital stay, groin is stable.  There is no evidence of hematoma or bruit.  Postprocedure CPK was 163 which  was in normal range.  His BUN was 16, creatinine 1.1, potassium 4.4.  Hemoglobin 13.4, hematocrit 39.1, platelet count 157,000 which has been  stable.  The patient is in phase I cardiac rehab.  The patient has been  ambulating in hallway without any problems.  The patient will be discharged  on above medications and will be followed up in my office in one week.                                                Eduardo Osier. Sharyn Lull, M.D.    MNH/MEDQ  D:  11/20/2003  T:  11/21/2003  Job:  [id number]

## 2011-04-16 NOTE — Assessment & Plan Note (Signed)
[facility]                         GASTROENTEROLOGY OFFICE NOTE   NAME:Higdon, GAELAN GLENNON                         MRN:          [id number]  DATE:01/25/2007                            DOB:          [birth year]    PROBLEM:  Dysphagia.   REASON:  Mr. Chretien has returned following upper endoscopy and  dilatation.  He was dilated to 17 mm.  Diffuse esophagitis with possible  Candida was seen.  Biopsies demonstrated Candida and he received a 10  day course of fluconazole.  Mr. Sliger reports complete resolution of his  dysphagia.  He currently has no GI complaints.   EXAMINATION:  Pulse 78, blood pressure 120/60, weight 258.   IMPRESSION:  Recurrent esophageal stricture complicated by Candida  esophagitis - asymptomatic following dilatation therapy and medical  therapy.   RECOMMENDATIONS:  Repeat dilatation as needed.     Barbette Hair. Arlyce Dice, MD,FACG  Electronically Signed    RDK/MedQ  DD: 01/25/2007  DT: 01/25/2007  Job #: [id number]   cc:   Eduardo Osier. Sharyn Lull, M.D.

## 2011-04-16 NOTE — Assessment & Plan Note (Signed)
[facility]   NAME:  DECLIN, RAJAN NO.:  [id number]   MEDICAL RECORD NO.:  [id number]      DATE OF BIRTH:  1929-01-30   PHYSICIAN:  Maxwell Caul, M.D. VISIT DATE:  12/07/2006                                   OFFICE VISIT   Mr. Flessner is a gentleman who was last seen here on December 18.  He has  a Wegener's grade 3 diabetic foot ulcer over the first left metatarsal  head.  He had been offered hyperbaric oxygen and was approved for this.  However, he declined for personal issues.  Since he has been here I am  really not certain what he has been having done to this wound.  He  states somebody in his house is helping him dress this.  Previously he  was using a moist to moist and a Darco healing wedge sandal.   PHYSICAL EXAMINATION:  VITAL SIGNS:  On examination his temperature was  97.9.  His blood pressure was initially 88/41.  However, when he was put  in a supine position his blood pressure came up to 130, sitting his  blood pressure dropped down to 100 systolic.  He was initially  complaining of dizziness but no other symptoms.  RESPIRATORY:  Clear entry bilaterally.  CARDIAC:  Heart sounds were normal.  There were no murmurs.  No increase  in jugular venous pressure.  He has a pacemaker in place.  His pulse was  61 and strong.  WOUND EXAM:  There is a dry wound over the left first metatarsal head.  This did not have the appearance of being infected.  There is some  callus around the wound.  However, this was (again) not in need of  debridement or culture.   IMPRESSION:  1. Wegener's grade 3 diabetic foot wound.  He has declined hyperbaric      oxygen.  We applied Silver Shield gel and ABD pad.  We off-loaded      the metatarsal head and placed him back in his Darco healing      sandal.  We will see him again in a week's time.  I am really not      exactly certain who is going to dress this at home.  He just does      not seem  able to do this.  2. Hypotension on arrival.  I suspect this is orthostatic hypotension.      He seemed to do well lying back but really became fairly      symptomatic when he sits up.  3. Psychosis.  I also note that this patient had a large amount of      paranoid delusions at various organizations, the [employer], etc.  He      tells me his primary doctor is Dr. Oliver Barre and he saw him      yesterday.  He also tells me he was in the ER on Tuesday night.  I      will have a look and see what it might be on E chart which would be      helpful in dealing with this gentleman's care.   For now we have dressed the wound  as noted above.  We will see him again  next week.  I wonder about his ability to care for this on his own.  I  do not believe, however, he qualifies for home health.  He drove himself  here today.           ______________________________  Maxwell Caul, M.D.     MGR/MEDQ  D:  12/08/2007  T:  12/08/2007  Job:  [id number]

## 2011-04-16 NOTE — Discharge Summary (Signed)
[place]  Patient:    [name], [name] Visit Number: [id number] MRN: [id number]          Service Type: MED Location: [phone] Attending Physician:  Gwenyth Bender Dictated by:   Lind Guest. August Saucer, M.D. Admit Date:  08/26/2001 Discharge Date: 09/01/2001                             Discharge Summary  FINAL DIAGNOSES:  1. Chronic stomach ulcer with hemorrhage, 531.40.  2. Diabetes mellitus with neurologic manifestations, 250.61.  3. Diabetes mellitus with ophthalmologic manifestations, 250.51.  4. Compulsive disorder, 780.39.  5. Hypertension, 401.9.  6. Coronary atherosclerosis of native coronary vessels, 414.01.  7. Below-knee amputation status, V49.75.  8. History of past noncompliance, V15.81.  9. Old myocardial infarction, 412. 10. Status post percutaneous transluminal coronary angioplasty, V45.82. 11. Neuropathy secondary to diabetes, 357.2. 12. Diabetic retinopathy, 362.01. 13. Hypotension, 458.9.  OPERATIONS AND PROCEDURES:  Endoscopic control of gastric and duodenal bleeding per Dr. Randa Evens.  HISTORY OF PRESENT ILLNESS:  This is one of several [facility] admissions for this 77 year old single black male with a long-standing history of diabetes mellitus, hypertension, atherosclerotic coronary vascular disease and peripheral vascular disease, who was brought to the emergency room for evaluation of increasing weakness, orthostasis, and upper GI bleed.  The patient is status post right BKA in September of this year.  Discharged home on September 20.   Had felt well until approximately three days prior to admission.  The patient noted increasing dark stools, which were not melanotic, without associated abdominal pain or change in appetite.  On the morning of admission patient felt weaker with standing up.  He experienced a presyncopal spell with questionable seizure activity.  When seen by EMS, patient noted to be orthostatic with  the blood pressure decreasing to 80s when sitting. In the emergency room the patient complained of abdominal pain with nausea.  Vomited approximately 300 cc of dark emesis at 1910.  At 2000, the patient had a large melanotic stool followed by a grand mal seizure lasting approximately 30 seconds.  He was postictal thereafter.  He was given Ativan 1 mg IV.  The patient thereafter was admitted for further evaluation.  Past medical history and physical exam as per admission H&P.  HOSPITAL COURSE:  The patient was admitted for further evaluation of upper GI bleed with a question of recent recurrent peptic ulcer disease versus other. He notably had been on Plavix most recently for his underlying coronary artery disease.  The patient due to his evident hypotension at the time of presentation was admitted to the intensive care unit.  His hemoglobin notably was 5.6.  He was subsequently transfused to achieve a hemoglobin greater than 8.  The patient was placed on IV Protonix.  Stool checks were obtained every 12 hours.  CT scan of the head was also ordered due to the recent seizures. This was negative.  The patient was seen in consultation urgently by Dr. Carman Ching of gastroenterology.  Once his blood pressure was stable, he subsequently underwent endoscopy.  He was found to have a bleeding gastric ulcer.  This was subsequently stopped per injection with Dr. Randa Evens.  The patient thereafter was monitored closely.  He was transfused as needed to achieve a hemoglobin greater than 8.  His Plavix notably was discontinued during that time.  On the subsequent days, the patient made gradual  progress.  He did experience transient dyspnea while hospitalized without associated chest pain.  No associated arrhythmias were noted as well.  He did continue to drift initially, but this eventually subsided.  His medications were continued, and he was changed to oral regimen by October 2.  The patient was  gradually made more ambulatory, which he tolerated well.  He was noted to have a mild induration to the left femoral line site.  He was placed on Tequin empirically at that time.  The site was cultured as well.  By September 01, 2001, he was feeling considerably better.  No orthostatic changes.  Hemoglobin was stable at 10.5.  Electrolytes were adequate as well.  Arrangements were made thereafter for patient to be discharged home with ongoing home care.  DISCHARGE MEDICATIONS: 1. Imdur 60 mg a half-tablet q.d. 2. Norvasc 5 mg q.d. 3. Glucotrol XL 10 mg q.a.m. 4. Tequin 400 mg q.d. 5. Protonix 40 mg q.a.m. and p.m. 6. Ferrous gluconate 300 mg b.i.d. 7. Vicon Forte one q.d.  DISCHARGE INSTRUCTIONS:  Activity will be as tolerated.  He will be maintained on an 1800 calorie ADA 4 g sodium diet.  Sterile dressing changes daily. Follow-up in the office in 10 days time. Dictated by:   Lind Guest. August Saucer, M.D. Attending Physician:  Gwenyth Bender DD:  10/11/01 TD:  10/12/01 Job: [id number]
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
## 2011-04-16 NOTE — Assessment & Plan Note (Signed)
[facility]                         GASTROENTEROLOGY OFFICE NOTE   NAME:[name], [name] DUSENBERY                         MRN:          [id number]  DATE:12/20/2006                            DOB:          [birth year]    PROBLEM:  Dysphagia.   [name] has returned again complaining of dysphagia of solids. He has  some discomfort with swallowing as well. He has a history of esophageal  stricture and was last dilated in March 2006. He remains on Protonix. He  was colonoscoped by Dr. Elnoria Howard for evaluation of left upper quadrant pain  in October 2005. A polyp apparently was seen and removed. Two small AVMs  were also seen as well as diverticula.   MEDICATIONS:  Include; Avapro, Lipitor, UroXatral, Plavix, Metformin,  Aspirin, Isosorbide, Protonix, and Coreg.   HE IS ALLERGIC TO PERCOCET AND INSULIN.   PHYSICAL EXAMINATION:  VITAL SIGNS: Stable.  HEENT: EOMI. PERRLA. Sclerae are anicteric.  Conjunctivae are pink.  NECK:  Supple without thyromegaly, adenopathy or carotid bruits.  CHEST:  Clear to auscultation and percussion without adventitious  sounds.  CARDIAC:  Regular rhythm; normal S1 S2.  There are no murmurs, gallops  or rubs.  ABDOMEN:  Bowel sounds are normoactive.  Abdomen is soft, non-tender and  non-distended.  There are no abdominal masses, tenderness, splenic  enlargement or hepatomegaly.  EXTREMITIES:  Full range of motion.  No cyanosis, clubbing or edema.  RECTAL:  Deferred.   IMPRESSION:  Recurrent esophageal stricture. It is noteworthy that he  had a 15 cm stricture.   RECOMMENDATIONS:  1. Continue Protonix.  2. Upper endoscopy with Savary dilation under fluoroscopic guidance.     Barbette Hair. Arlyce Dice, MD,FACG  Electronically Signed    RDK/MedQ  DD: 12/20/2006  DT: 12/20/2006  Job #: [id number]   cc:   Eduardo Osier. Sharyn Lull, M.D.

## 2011-04-16 NOTE — Discharge Summary (Signed)
[name]. [name]  Patient:    [name], [name] Visit Number: [id number] MRN: [id number]          Service Type: [name]: [id number] Attending Physician:  Faith Rogue T Dictated by:   Mcarthur Rossetti. Angiulli, P.A. Admit Date:  08/12/2001 Disc. Date: 08/18/01   CC:         [name] Hart L. [name], M.D.  [name] L. [name], M.D.   Discharge Summary  DISCHARGE DIAGNOSES: 1. Right below-knee amputation September 10. 2. Methicillin resistant Staph aureus of the right foot. 3. Diabetes mellitus. 4. Hypertension. 5. Coronary artery disease. 6. Anemia.  HISTORY OF PRESENT ILLNESS:  A 77 year old male who was admitted [name] 20 with MRSA infection, necrosis of the right foot, and no change with conservative care.  Underwent amputation of second right toe and debridement September 4.  Placed on intravenous antibiotics and monitored.  Developed increased ischemia with poor healing.  Underwent right below-knee amputation September 10 per Dr. Lurene [name].  Postoperative pain management.  Minimal assistance for ambulation.  No chest pain or shortness of breath.  He remained on Cipro for wound coverage.  His vancomycin had since been discontinued. Latest chemistries with a hemoglobin 8.3 on September 11.  He was admitted for a comprehensive rehabilitation program.  PAST MEDICAL HISTORY:  See discharge diagnoses.  PAST SURGICAL HISTORY:  Left fourth toe amputation with multiple vascular procedures.  ALLERGIES:  DARVOCET, TYLOX.  SOCIAL HISTORY:  No alcohol, tobacco.  Single.  Lives with roommate in first floor apartment.  Independent prior to admission.  Daughter in area with limited assistance.  Patient is retired.  MEDICATIONS:  Aspirin, Norvasc, Plavix, Glucotrol, Toprol, Imdur.  HOSPITAL COURSE:  Patient did well on rehabilitation services with therapies initiated on a b.i.d. basis.  The following issues were followed during patients rehabilitation course:   Pertaining to Mr. Flight right below-knee amputation remains stable.  Staples remained intact.  He would follow-up with Dr. Lurene [name] in one week for removal of clips.  His phantom pain continued to improve.  He would follow-up at the [facility] for preprosthetic training.  He remained on Cipro for wound coverage, recent MRSA of the right foot which necessitated below-knee amputation.  He would follow-up with Dr. Lurene [name] for questionable ongoing need for antibiotic.  His blood sugars remained controlled with Diabeta that he received b.i.d.  Blood sugars 104, 73, 71.  Blood pressures were controlled with Imdur, Norvasc.  He remained on Plavix and aspirin for his peripheral vascular disease.  He was also on subcutaneous heparin until his mobility improved throughout his rehabilitation state.  Was discontinued at time of discharge.  Overall for his functional mobility he continued to improve in his overall ambulation.  He was supervision for activities of daily living and transfers.  Plan would be for home health physical and occupational therapy.  He would receive assistance through his roommate and some through his daughter.  During his rehabilitation course noted anemia.  Latest hemoglobin of 8.3 on September 11, was 8.2 on September 16.  Thus, he did receive 2 units of packed red blood cells due to some dizziness and mild shortness of breath with followup hemoglobin 10.9.  He also received potassium supplement for a potassium of 3.3.  LABORATORIES:  Sodium 139, potassium 3.8, BUN 15, creatinine 0.8.  Hemoglobin 10.9, hematocrit 32.8.  DISCHARGE MEDICATIONS: 1. Diabeta 10 mg b.i.d. 2. Imdur 60 mg q.d. 3. Norvasc 10 mg q.d. 4. Ecotrin 325 mg  q.d. 5. Cipro 500 mg b.i.d. 6. Multivitamin q.d. 7. Plavix 75 mg q.d. 8. Vicodin p.r.n. for pain.  ACTIVITY:  As tolerated.  DIET:  An 1800 calorie ADA.  WOUND CARE:  Follow-up with Dr. Lurene [name] in one week for removal of  staples. FOLLOW-UP:  The patient will receive followup at the [name] with Dr. Riley Kill.  He should see Dr. August [name] for medical management. Dictated by:   Mcarthur Rossetti. Angiulli, P.A. Attending Physician:  Faith Rogue T DD:  08/17/01 TD:  08/17/01 Job: [id number]

## 2011-04-16 NOTE — Procedures (Signed)
[place]  Patient:    [name], [name] Visit Number: [id number] MRN: [id number]          Service Type: Attending:  Llana Aliment. Randa Evens, M.D. Dictated by:   Llana Aliment. Randa Evens, M.D. Proc. Date: 08/27/01   CC:         Eric L. August Saucer, M.D.   Procedure Report  PROCEDURE:  Esophagogastroduodenoscopy with controlled bleeding.  MEDICATIONS:  Cetacaine spray, fentanyl 40 mcg, and Versed 4 mg IV.  INDICATIONS:  A 77 year old gentleman who has been on aspirin and Plavix.  He had a recent BK amputation.  He came in with syncope, hypotension, and melena.  DESCRIPTION OF PROCEDURE:  The procedure had been explained to the patient and consent obtained.  With the patient in the left lateral decubitus position, the Olympus video endoscope was inserted blindly into the esophagus and advanced under direct visualization.  In the distal esophagus region a small ulcer was seen that was not bleeding.  We went down to the stomach.  A large amount of blood was seen in the stomach.  Part of the greater curve and fundus were obscured.  In the antrum just proximally 5 cm proximal to the incisura was an actively bleeding gastric ulcer.  It was irrigated and continued to ooze.  The area was injected with 1 cc of epinephrine 1:10,000.  Despite some diminishment in the bleeding, it was still bleeding.  At this point, I went ahead and used the BICAP electrocautery probe and with continuous current and pressure, multiple applications were applied around the edge of the ulcer in the middle with improvement.  At the termination of the procedure, there was no active bleeding.  The patient tolerated the procedure well.  ASSESSMENT:  Actively bleeding gastric ulcer, treated.  PLAN:  Will place the patient on a Protonix drip, given sips of clear liquid, and follow clinically.  If he rebleeds, I will attempt to rescope and stop it again.  Will hold aspirin and Plavix. Dictated by:   Llana Aliment. Randa Evens, M.D. Attending:  Llana Aliment. Randa Evens, M.D. DD:  08/27/01 TD:  08/27/01 Job: [id number]

## 2011-04-16 NOTE — Discharge Summary (Signed)
[place]  Patient:    [name], [name]                         MRN: [id number] Adm. Date:  [id number] Disc. Date: [id number] Attending:  Robynn Pane CC:         Dr. Sharyn Lull             Dr. August Saucer.             Dr. Shana Chute                           Discharge Summary  ADMITTING DIAGNOSES: 1.  Chest pain, mild congestive heart failure; rule out myocardial infarction. 2.  Coronary artery disease, history of myocardial infarction in 1996, status post     percutaneous transluminal coronary angioplasty to proximal left anterior     descending. 3.  Uncontrolled diabetes mellitus. 4.  Hypertension. 5.  History of passive smoking. 6.  Positive family history of coronary artery disease.  DISCHARGE DIAGNOSES: 1.  Unstable angina, compensated congestive heart failure, status post percutaneous     transluminal coronary angioplasty and stenting to the mid and distal right     coronary artery. 2.  Coronary artery disease, history of myocardial infarction in 1996, status post     percutaneous transluminal coronary angioplasty to proximal left anterior     descending. 3.  Non-insulin dependent diabetes mellitus. 4.  Hypertension. 5.  History of passive tobacco abuse. 6.  Positive family history of coronary artery disease.  DISCHARGE MEDICATIONS: 1.  Imdur 60 mg 1 tablet q.d. in the morning. 2.  Toprol XL 50 mg 1/2 tablet q.d. 3.  Glucotrol XL 10 mg 2 tablets in the morning. 4.  Enteric-coated aspirin 325 mg 1 tablet q.d. 5.  Plavix 75 mg 1 tablet q.d. with food. 6.  Nitrostat 0.4 mg sublingual, use as directed. 7.  Altace 2.5 mg 1 capsule q.d.Marland Kitchen  ACTIVITY:  Avoid heavy lifting, pushing or pulling.  DIET:  Low-salt/low-cholesterol 1800 calorie ADA diet.  DISPOSITION:  Post angioplasty and stent instructions have been given.  Monitor  blood sugars fasting before supper daily and chart.  Follow up with me in one week and with Dr. Shana Chute in two  weeks.  DISCHARGE CONDITION:  Stable.  HISTORY OF PRESENT ILLNESS:  The patient is a 77 year old black male with a past medical history significant for coronary artery disease, status post MI in 1996, status post PTCA to the proximal LAD, hypertension, non-insulin dependent diabetes mellitus, peripheral vascular disease, history of passive smoking, who came to he ER complaining of crampy precordial chest pain while driving a car.  This lasted for ten to 15 minutes and he states the pain is similar in nature to when he had MI in 1996.  The patient also gives a history of exertional dyspnea and feeling weak and tired and fatigued after walking one to two blocks.  The patient denies PND, orthopnea, leg swelling, denies palpitations, lightheadedness, or syncope. Denies cough, fever, chills, denies abdominal pain, nausea, vomiting.  PAST MEDICAL HISTORY:  As above.  PAST SURGICAL HISTORY:  Amputation of three toes of the left foot, status post laceration of abdominal wall 20+ years ago, status post laser surgery of both eyes.  ALLERGIES:  No known drug allergies.  SOCIAL HISTORY:  He is single and retired, worked in Publishing rights manager for 25 years.  No history of  smoking but came in contact with passive smoke.  No history of alcohol abuse.  FAMILY HISTORY:  Mother died of diabetic complications at age 59.  Father died f MI at the age of 21.  One brother died of diabetic complications at the age of 61. One brother died of MI at the age of 61.  One sister died of breast carcinoma.  MEDICATIONS: 1.  Imdur 60 mg p.o. q.d. 2.  Norvasc 5 mg p.o. q.d. 3.  Enteric-coated aspirin 325 mg p.o. q.d. 4.  Allegra for allergies.  PHYSICAL EXAMINATION:  GENERAL:  On examination is alert and oriented x three, and in no acute distress.  VITAL SIGNS:  Blood pressure 144/76, pulse 76 and regular.  HEENT:   Conjunctivae pink.  NECK:  Supple, no JVD, no bruits.  LUNGS:  Decreased  breath sounds at bases, good air entry.  CARDIOVASCULAR:  S1 and S2 normal.  There was a soft S3 gallop.  ABDOMEN:  Soft, bowel sounds present, nontender.  EXTREMITIES:  No clubbing, cyanosis, or edema.  LABORATORY DATA:  EKG showed normal sinus rhythm, left anterior fascicular block, right bundle branch block, with no ischemic changes.  Hemoglobin was 14.3, hematocrit 40.5, WBC 4.4.  Two sets of CPKs were negative.  Troponin was also negative.  Sodium 136, potassium 3.4, chloride 103, bicarbonate 30.  Blood sugar 391, repeat blood sugar 242.  BUN 13, creatinine 1.0.  HOSPITAL COURSE:  The patient was admitted to a telemetry unit and MI was ruled out by serial enzymes and EKG.  Due to chest pain and multiple risk factors and review of the prior films I discussed with the patient various options of treatment; i.e., stress test versus invasive therapy and its risks; i.e., death, MI, stroke, risk of restenosis, local vascular complications, etc.  The patient consented for the invasive procedure.  The patient underwent successful left cardiac catheterization with selective left and right coronary angiography, and PTCA and stenting to the RCA per catheterization report without any complications.  The patient tolerated the procedure well.  The sheaths were pulled out and the groin was stable. There was no evidence of hematoma.  The patient did not have any evidence of chest pain since angioplasty.  The patient will be discharged home in the morning if hemodynamically stable on the above medications, and will follow up with me in ne week and with Dr. Shana Chute in two weeks, and also with Dr. August Saucer in two weeks.DD: 02/12/00 TD:  02/15/00 Job: [id number]

## 2011-04-16 NOTE — Op Note (Signed)
[place]. [facility]  Patient:    [name], [name]                         MRN: [id number] Proc. Date: 04/04/01 Adm. Date:  [id number] Attending:  Sonda Primes                           Operative Report  PREOPERATIVE DIAGNOSIS:  Osteomyelitis with gangrene of the left foot.  POSTOPERATIVE DIAGNOSIS:  Osteomyelitis with gangrene of the left foot.  PROCEDURE:  Amputation of fifth digit, left foot, with debridement of infected bone and soft tissue.  SURGEON:  Mardene Celeste. Lurene Shadow, M.D.  ASSISTANT:  Nurse.  ANESTHESIA:  General.  CLINICAL NOTE:  [name] is a 77 year old diabetic man with insensate feet and severe retinopathy.  He presents with a foul-smelling, gangrenous lesion over the metatarsophalangeal joint of the fifth digit on the left foot.  He has had amputations of digits two, three, and four previously.  He comes to the operating room now for debridement and resection of his fifth digit on the left foot.  DESCRIPTION OF PROCEDURE:  Following the induction of satisfactory general anesthesia, the patient is positioned supinely and the left foot prepped and draped routinely.  The exposed metatarsophalangeal joint was dissected down upon a large amount of purulent material coming from around the toe and surrounding area.  I made a lateral incision along the foot so as to get access to the midshaft of the metatarsal.  It was deepened through the subcutaneous tissue down in the periosteum, removed with an elevator.  I used a bone cutter to transect the fifth metatarsal at the midshaft and carried the dissection up along the metatarsal, carried up to the metatarsophalangeal joint.  A shield-type incision was made around the toe, carrying this down to and inclusive of the metatarsophalangeal joints and removing the entire specimen and forwarding for pathologic evaluation.  Necrotic tissue was extensively debrided from within the foot.  There  brisk bleeding could be noted at this point as viable tissues were encountered.  The wound was then thoroughly irrigated using a pulse irrigator to remove as much loose debris, and additional necrotic tissue was removed from the foot.  The sponge, instrument, and sharp counts were verified, and the foot was then loosely closed with sutures of 3-0 nylon.  A sterile compressive dressing was applied, anesthetic reversed, and the patient removed from the operating room to the recovery room in stable condition, having tolerated the procedure well. DD:  04/04/01 TD:  04/05/01 Job: [id number]

## 2011-04-20 ENCOUNTER — Ambulatory Visit (INDEPENDENT_AMBULATORY_CARE_PROVIDER_SITE_OTHER): Payer: Medicare Other | Admitting: Internal Medicine

## 2011-04-20 ENCOUNTER — Encounter: Payer: Self-pay | Admitting: Internal Medicine

## 2011-04-20 DIAGNOSIS — Z950 Presence of cardiac pacemaker: Secondary | ICD-10-CM | POA: Insufficient documentation

## 2011-04-20 DIAGNOSIS — I10 Essential (primary) hypertension: Secondary | ICD-10-CM

## 2011-04-20 DIAGNOSIS — I5022 Chronic systolic (congestive) heart failure: Secondary | ICD-10-CM

## 2011-04-20 MED ORDER — POTASSIUM CHLORIDE CRYS ER 20 MEQ PO TBCR: EXTENDED_RELEASE_TABLET | ORAL | Status: DC

## 2011-04-20 MED ORDER — FUROSEMIDE 40 MG PO TABS: ORAL_TABLET | ORAL | Status: DC

## 2011-04-20 NOTE — Assessment & Plan Note (Signed)
stable overall by hx and exam, most recent lab reviewed with pt, and pt to continue medical treatment as before  BP Readings from Last 3 Encounters:  04/20/11 108/61  04/14/11 120/72  03/24/11 116/62

## 2011-04-20 NOTE — Assessment & Plan Note (Signed)
stable overall by hx and exam, most recent lab reviewed with pt, and pt to continue medical treatment as before  Lab Results  Component Value Date   TSH 7.74* 03/01/2011

## 2011-04-20 NOTE — Patient Instructions (Signed)
Increase Lasix (furosemide) to 80mg  in the morning and 40mg  in the evening. This will be two 40mg  tablets in the morning and one 40mg  tablet in the evening.  Increase KCL(potassium) to 40 mEq in the morning and 20 mEq in the evening. This will be two 20 mEq tablets in the morning and one 20 mEq tablets in the evening.  Schedule an appointment for lab in 10 days--BMP  [phone]  Schedule an appointment to see Dr Ladona Ridgel in 3 months.

## 2011-04-20 NOTE — Assessment & Plan Note (Signed)
His device is working normally. His bi-V. ICD has been removed and he now has an IV pacemaker. He clearly had a postoperative hematoma. At the present time it does not look like he has a device infection. He is instructed to call us if he has any fevers and chills.

## 2011-04-20 NOTE — Assessment & Plan Note (Signed)
His symptoms are still not well-controlled in today as that he increase his dose of Lasix from 80 mg daily 120 mg daily with 80 mg taken in the morning and 40 mg taken in the evening. We'll also increase his potassium. I've asked that he have his labs checked in a week
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
## 2011-04-23 ENCOUNTER — Ambulatory Visit (INDEPENDENT_AMBULATORY_CARE_PROVIDER_SITE_OTHER): Payer: Medicare Other | Admitting: Endocrinology

## 2011-04-23 ENCOUNTER — Encounter: Payer: Self-pay | Admitting: Endocrinology

## 2011-04-23 DIAGNOSIS — E119 Type 2 diabetes mellitus without complications: Secondary | ICD-10-CM
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
## 2011-04-23 NOTE — Patient Instructions (Addendum)
good diet and exercise habits significanly improve the control of your diabetes.  please let me know if you wish to be referred to a dietician.  high blood sugar is very risky to your health.  you should see an eye doctor every year. controlling your blood pressure and cholesterol drastically reduces the damage diabetes does to your body.  this also applies to quitting smoking.  please discuss these with your doctor.  you should take an aspirin every day, unless you have been advised by a doctor not to. check your blood sugar 2 times a day.  vary the time of day when you check, between before the 3 meals, and at bedtime.  also check if you have symptoms of your blood sugar being too high or too low.  please keep a record of the readings and bring it to your next appointment here.  please call us sooner if you are having low blood sugar episodes. we will need to take this complex situation in stages For now, take lantus 70 units daily. Please make a follow-up appointment in 1-2 weeks. Stop glipizide.

## 2011-04-30 ENCOUNTER — Other Ambulatory Visit: Payer: Medicare Other | Admitting: *Deleted

## 2011-05-05 ENCOUNTER — Ambulatory Visit: Payer: Medicare Other | Admitting: Endocrinology

## 2011-05-05 DIAGNOSIS — Z0289 Encounter for other administrative examinations: Secondary | ICD-10-CM

## 2011-05-06 ENCOUNTER — Telehealth: Payer: Self-pay | Admitting: Internal Medicine

## 2011-05-06 NOTE — Telephone Encounter (Signed)
Dr. Lynnea Ferrier from [facility] calling re pt. Pt was admit to [facility]. Dr. Eulah Citizen like to talk to Dr. Ladona Ridgel re pt.

## 2011-05-10 NOTE — Discharge Summary (Signed)
  [name], [name]                  ACCOUNT NO.:  [id number]  MEDICAL RECORD NO.:  [id number]           PATIENT TYPE:  I  LOCATION:  2501                         FACILITY:  [facility]  PHYSICIAN:  Lonia Blood, [name].D.       DATE OF BIRTH:  [birth year]  DATE OF ADMISSION:  04/06/2011 DATE OF DISCHARGE:  04/08/2011                              DISCHARGE SUMMARY   Please note that on the discharge summary, medication #1 was dictated as Levaquin.  Please note that the correct medication is amlodipine at 5 mg daily.     Lonia Blood, [name].D.     SL/MEDQ  D:  04/25/2011  T:  04/25/2011  Job:  [id number]  Electronically Signed by Lonia Blood [name].D. on 05/10/2011 09:34:06 AM

## 2011-05-11 ENCOUNTER — Encounter: Payer: Medicare Other | Admitting: Physician Assistant

## 2011-05-13 ENCOUNTER — Encounter: Payer: Medicare Other | Admitting: Physician Assistant

## 2011-05-14 ENCOUNTER — Telehealth: Payer: Self-pay | Admitting: *Deleted

## 2011-05-14 NOTE — Telephone Encounter (Signed)
Pt left message on triage requesting callback regarding medications. Pt states that he wants to schedule an office visit to discuss medications with Dr. [name]. Pt has recently been discharged from Hospital in [place]. Pt schedule for OV on Monday with Dr. [name] to discuss medications.

## 2011-05-17 ENCOUNTER — Encounter: Payer: Self-pay | Admitting: Internal Medicine

## 2011-05-17 ENCOUNTER — Other Ambulatory Visit (INDEPENDENT_AMBULATORY_CARE_PROVIDER_SITE_OTHER): Payer: Medicare Other

## 2011-05-17 ENCOUNTER — Ambulatory Visit (INDEPENDENT_AMBULATORY_CARE_PROVIDER_SITE_OTHER): Payer: Medicare Other | Admitting: Internal Medicine

## 2011-05-17 VITALS — BP 100/62 | HR 62 | Temp 98.0°F | Ht 74.0 in | Wt 284.2 lb

## 2011-05-17 DIAGNOSIS — I2510 Atherosclerotic heart disease of native coronary artery without angina pectoris: Secondary | ICD-10-CM

## 2011-05-17 DIAGNOSIS — I10 Essential (primary) hypertension: Secondary | ICD-10-CM

## 2011-05-17 DIAGNOSIS — N390 Urinary tract infection, site not specified: Secondary | ICD-10-CM

## 2011-05-17 DIAGNOSIS — N400 Enlarged prostate without lower urinary tract symptoms: Secondary | ICD-10-CM

## 2011-05-17 LAB — URINALYSIS, ROUTINE W REFLEX MICROSCOPIC
Nitrite: NEGATIVE
Specific Gravity, Urine: >=1.03 (ref 1.000–1.030)
pH: 6 (ref 5.0–8.0)

## 2011-05-17 MED ORDER — CEPHALEXIN 500 MG PO CAPS: 500.0000 mg | ORAL_CAPSULE | Freq: Four times a day (QID) | ORAL | Status: AC

## 2011-05-17 MED ORDER — CARVEDILOL 25 MG PO TABS: 25.0000 mg | ORAL_TABLET | Freq: Two times a day (BID) | ORAL | Status: DC

## 2011-05-17 NOTE — Assessment & Plan Note (Addendum)
Asked pt to re-start the avodart as he was confused about the purpose of the med and has been not taking for several wks, f/u urology as planned
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
## 2011-05-17 NOTE — Patient Instructions (Addendum)
Continue all other medications as before You are given the carvedilol prescription today (hardcopy) Please go to LAB in the Basement for the blood and/or urine tests to be done today Please call the phone number [phone] (the PhoneTree System) for results of testing in 2-3 days;  When calling, simply dial the number, and when prompted enter the MRN number above (the Medical Record Number) and the # key, then the message should start. Please re-start the avodart for the prostate Please return on OCt 25 as planned, or sooner if needed

## 2011-05-18 LAB — URINE CULTURE

## 2011-05-19 NOTE — Discharge Summary (Signed)
[name], [name]                  ACCOUNT NO.:  [id number]  MEDICAL RECORD NO.:  [id number]           PATIENT TYPE:  I  LOCATION:  2501                         FACILITY:  [facility]  PHYSICIAN:  [name] Rotunda, MD, FACCDATE OF BIRTH:  Nov 04, 1929  DATE OF ADMISSION:  04/06/2011 DATE OF DISCHARGE:  04/08/2011                              DISCHARGE SUMMARY   PRIMARY CARDIOLOGIST:  [name] Canning. Ladona Ridgel, MD  PRIMARY CARE PROVIDER:  Corwin Levins, MD  DISCHARGE DIAGNOSIS:  Acute-on-chronic diastolic congestive heart failure.  SECONDARY DIAGNOSES: 1. Coronary artery disease status post multiple percutaneous coronary     interventions. 2. History of ischemic cardiomyopathy with ejection fraction     previously low at 40% to 50% in 2007. 3. Type 2 diabetes mellitus. 4. Hypertension. 5. Hypothyroidism. 6. Status post right below-knee amputation. 7. History of left Charcot foot. 8. Neurogenic bladder secondary to diabetes mellitus. 9. Gastroesophageal reflux disease. 10.History of pneumonia. 11.Hypokalemia.  ALLERGIES:  PERCOCET.  PROCEDURES:  None.  HISTORY OF PRESENT ILLNESS:  An 77 year old male with the above complex problem list.  Over approximately 2- to 3-day period prior to admission, began to experience dyspnea on exertion, orthopnea, and also episodic left-sided chest tightness occurring around the area of his biventricular pacer which was placed in February 2012.  Due to progressive orthopnea, the patient presented to [facility] ED on Apr 06, 2011, where he was felt to have mild volume overload though chest x-ray showed no acute disease.  The patient was admitted for further evaluation and diuresis.  HOSPITAL COURSE:  Following admission, the patient responded to diuresis well with symptomatic improvement.  His weight came down to 122.4 kg. With diuresis, the patient has been hypokalemic requiring additional supplementation.  Further, he was hypotensive requiring addition  of amlodipine to his current beta-blocker and ARB therapy.  This morning, the patient has been ambulating with significant improvement in his dyspnea and is ready for discharge today.  DISCHARGE LABORATORY DATA:  Hemoglobin 14.2, hematocrit 42.8, WBC 4.4, platelets 103.  Sodium 138, potassium 3.2, chloride 100, CO2 34, BUN 20, creatinine 1.26, glucose 136.  Total bilirubin 0.12, alkaline phosphatase 59, AST 16, ALT 16, total protein 61, albumin 3.3, calcium 8.8, phosphorus 3.2, magnesium 2.1.  CK 165, MB 5.3, troponin-I less than 0.30.  MRSA screen was negative.  BNP on admission was 916.6.  DISPOSITION:  The patient will be discharged home today in good condition.  FOLLOWUP PLANS AND APPOINTMENTS:  The patient will follow up with Dr. Lewayne [name] on Apr 20, 2011, at 1:30 p.m.  Follow up with Dr. Oliver [name] as previous scheduled.  DISCHARGE MEDICATIONS: 1. Levaquin 5 mg daily. 2. Nitroglycerin 0.4 mg sublingual p.r.n. chest pain. 3. Aspirin 81 mg daily. 4. Furosemide 40 mg b.i.d. 5. Potassium chloride 20 mEq b.i.d. 6. Carvedilol 25 mg b.i.d. 7. Finasteride 5 mg daily. 8. Glipizide XL 10 mg daily. 9. Irbesartan 300 mg daily. 10.Isosorbide dinitrate 30 mg b.i.d. 11.Lantus 90-100 units q.p.m. as previously prescribed. 12.Levothyroxine 150 mcg daily. 13.Omeprazole 20 mg daily. 14.Plavix 75 mg daily. 15.Simvastatin 80 mg at bedtime.  OUTSTANDING  LABORATORY DATA AND STUDIES:  None.  DURATION OF DISCHARGE ENCOUNTER:  Sixty minutes including physician time.     [name], ANP   ______________________________ [name] Rotunda, MD, [facility]    [name]  D:  04/08/2011  T:  04/09/2011  Job:  [id number]  cc:   [name] Levins, MD  Electronically Signed by [name] ANP on 04/14/2011 01:13:00 PM Electronically Signed by [name] Rotunda MD [facility] on 05/19/2011 11:38:15 AM

## 2011-05-23 ENCOUNTER — Encounter: Payer: Self-pay | Admitting: Internal Medicine

## 2011-05-23 NOTE — Assessment & Plan Note (Signed)
stable overall by hx and exam, most recent data reviewed with pt, and pt to continue medical treatment as before - re-start coreg, f/u card as planned  Lab Results  Component Value Date   WBC 4.4 04/07/2011   HGB 14.2 04/07/2011   HCT 42.8 04/07/2011   PLT 103* 04/07/2011   CHOL 101 03/01/2011   TRIG 54.0 03/01/2011   HDL 38.40* 03/01/2011   ALT 16 04/07/2011   AST 16 04/07/2011   NA 138 04/08/2011   K 3.2* 04/08/2011   CL 100 04/08/2011   CREATININE 1.26 04/08/2011   BUN 20 04/08/2011   CO2 34* 04/08/2011   TSH 7.74* 03/01/2011   PSA 9.41* 12/02/2009   INR 1.0 01/01/2011   HGBA1C 10.8* 03/01/2011   MICROALBUR 50.7* 12/02/2009

## 2011-05-23 NOTE — Assessment & Plan Note (Signed)
stable overall by hx and exam, most recent data reviewed with pt, and pt to continue medical treatment as before  BP Readings from Last 3 Encounters:  05/17/11 100/62  04/23/11 126/70  04/20/11 108/61

## 2011-05-23 NOTE — Assessment & Plan Note (Signed)
prob recurrent mild, for antibx tx, f/u urology as planned

## 2011-05-31 ENCOUNTER — Other Ambulatory Visit: Payer: Self-pay | Admitting: Internal Medicine

## 2011-05-31 NOTE — Telephone Encounter (Signed)
Print Rx has not been filled per pharmacy. Gave VO to fill.

## 2011-06-27 ENCOUNTER — Emergency Department (HOSPITAL_COMMUNITY): Payer: Medicare Other

## 2011-06-27 ENCOUNTER — Inpatient Hospital Stay (HOSPITAL_COMMUNITY)
Admission: EM | Admit: 2011-06-27 | Discharge: 2011-06-29 | DRG: 313 | Disposition: A | Discharge status: Home / Self Care | Payer: Medicare Other | Attending: Internal Medicine | Admitting: Internal Medicine

## 2011-06-27 DIAGNOSIS — R079 Chest pain, unspecified: Secondary | ICD-10-CM

## 2011-06-27 DIAGNOSIS — N319 Neuromuscular dysfunction of bladder, unspecified: Secondary | ICD-10-CM | POA: Diagnosis present

## 2011-06-27 DIAGNOSIS — I10 Essential (primary) hypertension: Secondary | ICD-10-CM | POA: Diagnosis present

## 2011-06-27 DIAGNOSIS — Z9861 Coronary angioplasty status: Secondary | ICD-10-CM

## 2011-06-27 DIAGNOSIS — E119 Type 2 diabetes mellitus without complications: Secondary | ICD-10-CM | POA: Diagnosis present

## 2011-06-27 DIAGNOSIS — Z794 Long term (current) use of insulin: Secondary | ICD-10-CM

## 2011-06-27 DIAGNOSIS — Z7902 Long term (current) use of antithrombotics/antiplatelets: Secondary | ICD-10-CM

## 2011-06-27 DIAGNOSIS — I2589 Other forms of chronic ischemic heart disease: Secondary | ICD-10-CM | POA: Diagnosis present

## 2011-06-27 DIAGNOSIS — Z7982 Long term (current) use of aspirin: Secondary | ICD-10-CM

## 2011-06-27 DIAGNOSIS — Z95810 Presence of automatic (implantable) cardiac defibrillator: Secondary | ICD-10-CM

## 2011-06-27 DIAGNOSIS — I739 Peripheral vascular disease, unspecified: Secondary | ICD-10-CM | POA: Diagnosis present

## 2011-06-27 DIAGNOSIS — S88119A Complete traumatic amputation at level between knee and ankle, unspecified lower leg, initial encounter: Secondary | ICD-10-CM

## 2011-06-27 DIAGNOSIS — K219 Gastro-esophageal reflux disease without esophagitis: Secondary | ICD-10-CM | POA: Diagnosis present

## 2011-06-27 DIAGNOSIS — E039 Hypothyroidism, unspecified: Secondary | ICD-10-CM | POA: Diagnosis present

## 2011-06-27 DIAGNOSIS — I2510 Atherosclerotic heart disease of native coronary artery without angina pectoris: Secondary | ICD-10-CM | POA: Diagnosis present

## 2011-06-27 DIAGNOSIS — E785 Hyperlipidemia, unspecified: Secondary | ICD-10-CM | POA: Diagnosis present

## 2011-06-27 LAB — GLUCOSE, CAPILLARY: Glucose-Capillary: 235 mg/dL — ABNORMAL HIGH (ref 70–99)

## 2011-06-27 LAB — TROPONIN I
Troponin I: <0.3 ng/mL (ref <0.30)
Troponin I: <0.3 ng/mL (ref <0.30)

## 2011-06-27 LAB — CBC
HCT: 38.6 % — ABNORMAL LOW (ref 39.0–52.0)
Hemoglobin: 13.5 g/dL (ref 13.0–17.0)
MCV: 82.7 fL (ref 78.0–100.0)
RDW: 13.6 % (ref 11.5–15.5)
WBC: 3.7 10*3/uL — ABNORMAL LOW (ref 4.0–10.5)

## 2011-06-27 LAB — BASIC METABOLIC PANEL
Chloride: 97 mEq/L (ref 96–112)
GFR calc Af Amer: 56 mL/min — ABNORMAL LOW (ref >60)
GFR calc non Af Amer: 46 mL/min — ABNORMAL LOW (ref >60)
Potassium: 3.8 mEq/L (ref 3.5–5.1)

## 2011-06-27 LAB — DIFFERENTIAL
Basophils Absolute: 0 10*3/uL (ref 0.0–0.1)
Eosinophils Relative: 1 % (ref 0–5)
Lymphocytes Relative: 32 % (ref 12–46)
Lymphs Abs: 1.2 10*3/uL (ref 0.7–4.0)
Monocytes Absolute: 0.3 10*3/uL (ref 0.1–1.0)
Neutro Abs: 2.1 10*3/uL (ref 1.7–7.7)

## 2011-06-27 LAB — CK TOTAL AND CKMB (NOT AT ARMC)
Relative Index: 3 — ABNORMAL HIGH (ref 0.0–2.5)
Total CK: 206 U/L (ref 7–232)

## 2011-06-28 DIAGNOSIS — I519 Heart disease, unspecified: Secondary | ICD-10-CM

## 2011-06-28 DIAGNOSIS — I214 Non-ST elevation (NSTEMI) myocardial infarction: Secondary | ICD-10-CM

## 2011-06-28 LAB — GLUCOSE, CAPILLARY
Glucose-Capillary: 109 mg/dL — ABNORMAL HIGH (ref 70–99)
Glucose-Capillary: 201 mg/dL — ABNORMAL HIGH (ref 70–99)
Glucose-Capillary: 122 mg/dL — ABNORMAL HIGH (ref 70–99)

## 2011-06-28 LAB — CBC
HCT: 39.4 % (ref 39.0–52.0)
MCH: 28.4 pg (ref 26.0–34.0)
MCHC: 34.3 g/dL (ref 30.0–36.0)
RDW: 13.4 % (ref 11.5–15.5)

## 2011-06-28 LAB — CARDIAC PANEL(CRET KIN+CKTOT+MB+TROPI)
CK, MB: 5.3 ng/mL — ABNORMAL HIGH (ref 0.3–4.0)
Relative Index: 2.3 (ref 0.0–2.5)
Total CK: 264 U/L — ABNORMAL HIGH (ref 7–232)
Troponin I: 0.42 ng/mL (ref <0.30)

## 2011-06-28 LAB — MRSA PCR SCREENING: MRSA by PCR: NEGATIVE

## 2011-06-28 LAB — BASIC METABOLIC PANEL
BUN: 19 mg/dL (ref 6–23)
Calcium: 8.5 mg/dL (ref 8.4–10.5)
Creatinine, Ser: 1.24 mg/dL (ref 0.50–1.35)
GFR calc Af Amer: >60 mL/min (ref >60)
GFR calc non Af Amer: 56 mL/min — ABNORMAL LOW (ref >60)
Glucose, Bld: 186 mg/dL — ABNORMAL HIGH (ref 70–99)
Potassium: 3.5 mEq/L (ref 3.5–5.1)

## 2011-06-29 ENCOUNTER — Inpatient Hospital Stay (HOSPITAL_COMMUNITY): Payer: Medicare Other

## 2011-06-29 LAB — GLUCOSE, CAPILLARY: Glucose-Capillary: 91 mg/dL (ref 70–99)

## 2011-06-30 ENCOUNTER — Other Ambulatory Visit: Payer: Self-pay | Admitting: Internal Medicine

## 2011-06-30 ENCOUNTER — Other Ambulatory Visit (HOSPITAL_COMMUNITY): Payer: Medicare Other

## 2011-07-21 ENCOUNTER — Encounter: Payer: Medicare Other | Admitting: Internal Medicine

## 2011-07-22 NOTE — Discharge Summary (Signed)
NAMECANUTO, [name]                  ACCOUNT NO.:  [id number]  MEDICAL RECORD NO.:  [id number]  LOCATION:  3702                         FACILITY:  [facility]  PHYSICIAN:  Hillis Range, MD       DATE OF BIRTH:  October 31, 1929  DATE OF ADMISSION:  06/27/2011 DATE OF DISCHARGE:  06/29/2011                              DISCHARGE SUMMARY   PRIMARY ELECTROPHYSIOLOGIST:  Doylene Canning. Ladona Ridgel, MD  PRIMARY CARDIOLOGIST:  Eduardo Osier. Sharyn Lull, MD  DISCHARGE DIAGNOSIS:  Chest pain.  SECONDARY DIAGNOSES: 1. Coronary artery disease status post multiple percutaneous     interventions. 2. Ischemic cardiomyopathy with ejection fraction of 50-55% by most     recent echocardiogram in June 28, 2011. 3. Status post biventricular implantable cardioverter defibrillator. 4. Hypertension. 5. Diabetes mellitus. 6. Hypothyroidism. 7. Hyperlipidemia. 8. Peripheral vascular disease status post right below-the-knee     amputation. 9. History of left Charcot foot. 10.Neurogenic bladder. 11.Gastroesophageal reflux disease.  ALLERGIES:  PERCOCET.  PROCEDURES:  Two-D echocardiogram performed June 28, 2011, revealing an EF of 55%, with normal wall motion, mild to moderately dilated left atrium.  HISTORY OF PRESENT ILLNESS:  An 77 year old male with the above problems.  The patient was in his usual state of health until the day June 27, 2011, when he had intermittent spells of chest cramping lasting few seconds and resolving spontaneously.  The symptoms occurred several times throughout the day and he presents to the [facility] ED where an ECG showed no acute changes and point-of-care markers were negative. The patient was admitted for further evaluation.  HOSPITAL COURSE:  The patient's first 2 sets of cardiac markers were negative with troponins of less than 0.30.  On third set, he has slight rise to 0.42 and then subsequently came back to less than 0.30.  He continued to have intermittent episodes of sharp and  fleeting cramping like chest pain.  A 2-D echocardiogram was performed on June 28, 2011, showing normal LV function as outlined above.  Given his history, we recommended Lexiscan Myoview, however, the patient has perfused and wishes to be discharged today.  Plan is to follow up with his previous cardiologist, Dr. Sharyn Lull.  DISCHARGE LABORATORY DATA:  Hemoglobin 13.5, hematocrit 39.4, WBC 4.1, platelets 110.  Sodium 138, potassium 3.5, chloride 102, CO2 30, BUN 19, creatinine 1.24, glucose 186, calcium 8.5, CK 210, MB 5.3, troponin I less than 0.30.  MRSA screen was negative.  DISPOSITION:  The patient will be discharged home today in good condition.  FOLLOWUP PLANS AND APPOINTMENTS:  The patient plans to followup with Dr. Sharyn Lull in 1-2 weeks.  DISCHARGE MEDICATIONS: 1. Amlodipine 5 mg daily. 2. Aspirin 325 mg daily. 3. Carvedilol 25 mg b.i.d. 4. Finasteride 5 mg daily. 5. Furosemide 40 mg b.i.d. 6. Glipizide XL 10 mg daily. 7. Irbesartan 300 mg daily. 8. Isosorbide dinitrate 30 mg b.i.d. 9. Lantus 70 units q.p.m. 10.Levothyroxine 150 mcg daily. 11.Nitroglycerin 0.4 mg sublingual p.r.n. chest pain. 12.Omeprazole 20 mg daily. 13.Potassium chloride 20 mEq b.i.d. 14.Plavix 75 mg daily. 15.Simvastatin 80 mg at bedtime.  OUTSTANDING LABORATORY STUDIES:  None.  DURATION OF DISCHARGE ENCOUNTER:  35 minutes including physician time.  [name], ANP   ______________________________ Hillis Range, MD    CB/MEDQ  D:  06/29/2011  T:  06/29/2011  Job:  [id number]  cc:   Eduardo Osier. Sharyn Lull, M.D.  Electronically Signed by [name] ANP on 07/09/2011 03:30:56 PM Electronically Signed by Hillis Range MD on 07/22/2011 09:42:34 AM

## 2011-07-22 NOTE — H&P (Signed)
[name], [name]                  ACCOUNT NO.:  [id number]  MEDICAL RECORD NO.:  [id number]  LOCATION:  3702                         FACILITY:  [facility]  PHYSICIAN:  Hillis Range, MD       DATE OF BIRTH:  18-Aug-1929  DATE OF ADMISSION:  06/27/2011 DATE OF DISCHARGE:                             HISTORY & PHYSICAL   CHIEF COMPLAINT:  Chest cramping.  HISTORY OF PRESENT ILLNESS:  [name] is a pleasant 77 year old gentleman with a history of known coronary artery disease, status post multiple prior PCIs with diabetes, hypertension, and peripheral vascular disease, who now presents with cramping chest pain.  He reports being in his usual state of health until earlier today.  Throughout the course of the day, he has noticed moderate intensity of "chest cramping."  These episodes last only several seconds, but have occurred several times throughout the day today.  He therefore presents to [facility] for further evaluation.  He denies shortness of breath, palpitations, arm/jaw pain, nausea, vomiting, dizziness, presyncope or syncope.  He is otherwise without complaint at this time.  He is unaware for any precipitating factors that may have led to his chest discomfort.  He is otherwise without complaint at this time.  PAST MEDICAL HISTORY: 1. Coronary artery disease, status post multiple PCIs previously, most     recent cath 2007.  Since that time, medical management has been     advised. 2. Status post biventricular ICD. 3. Ischemic cardiomyopathy with subsequent resolution of decreased     ejection fraction by the most recent echo in 2009. 4. Diabetes. 5. Hypertension. 6. Hypothyroidism. 7. Peripheral vascular disease, status post right BKA. 8. Left Charcot foot. 9. Neurogenic bladder. 10.GERD.  MEDICATIONS:  Reviewed in the [facility].  ALLERGIES:  PERCOCET.  SOCIAL HISTORY:  The patient denies tobacco, alcohol, or drug use.  FAMILY HISTORY:  Notable for coronary artery  disease.  REVIEW OF SYSTEMS:  All systems are reviewed and negative except as outlined in the HPI above.  PHYSICAL EXAMINATION:  Telemetry reveals a sinus rhythm with a ventricular paced rhythm. VITAL SIGNS:  Blood pressure 147/67, heart rate 60, respirations 18, sats 99% on room air. GENERAL:  The patient is a chronically ill and disheveled-appearing male in no acute distress.  He is alert and oriented x3. HEENT:  Normocephalic, atraumatic.  Sclerae clear.  Conjunctivae pink. Oropharynx is clear. NECK:  Supple.  JVP 9 cm. LUNGS:  Clear. HEART:  Regular rate and rhythm.  No murmurs, rubs, or gallops. GI:  Soft, nontender, nondistended.  Positive bowel sounds. EXTREMITIES:  Status post right BKA.  He does have chronic venous insufficiency with a Charcot left foot. MUSCULOSKELETAL:  Status post right BKA. NEURO:  Strength and sensation appear to be grossly intact.  EKG reveals sinus rhythm with biventricular pacing.  EKG today reveals no acute airspace disease.  LABORATORY DATA:  Reviewed in the chart.  IMPRESSION:  [name] is a pleasant 77 year old gentleman with a known history of coronary artery disease, status post prior percutaneous coronary interventions with most recent cath in 2007.  He has been managed medically since that time and has done reasonably well.  He presents today with atypical chest pain.  He has mild elevation in CK- MB, but his troponin remains negative at this time.  I think that we should admit the patient to the Cardiology Service for further evaluation.  As his pain is quite atypical, I have not started a heparin drip at this time.  I would recommend that we continue medical therapy for his CAD unless he has significant elevation in his cardiac markers or his chest pain significantly increases.  We will continue the patient's medical regimen.  I think that we should obtain an echocardiogram to evaluate for any specific wall motion abnormalities  or change in his ejection fraction. We will continue his medical therapy for diabetes and follow the patient closely while he is here.     Hillis Range, MD     JA/MEDQ  D:  06/27/2011  T:  06/28/2011  Job:  [id number]  Electronically Signed by Hillis Range MD on 07/22/2011 09:42:30 AM

## 2011-07-31 ENCOUNTER — Other Ambulatory Visit: Payer: Self-pay | Admitting: Internal Medicine

## 2011-08-19 LAB — URINALYSIS, ROUTINE W REFLEX MICROSCOPIC
Glucose, UA: NEGATIVE
Hgb urine dipstick: NEGATIVE
Ketones, ur: 15 — AB
Protein, ur: 30 — AB
pH: 5.5

## 2011-08-19 LAB — URINE CULTURE: Colony Count: 30000

## 2011-08-19 LAB — CK TOTAL AND CKMB (NOT AT ARMC)
CK, MB: 3.8
Relative Index: 2.8 — ABNORMAL HIGH
Total CK: 134

## 2011-08-19 LAB — CBC
HCT: 41.3
Platelets: 150
RDW: 14.4
WBC: 4.9

## 2011-08-19 LAB — TROPONIN I: Troponin I: 0.04

## 2011-08-19 LAB — POCT I-STAT CREATININE: Operator id: 272551

## 2011-08-19 LAB — I-STAT 8, (EC8 V) (CONVERTED LAB)
BUN: 28 — ABNORMAL HIGH
Bicarbonate: 27.8 — ABNORMAL HIGH
Glucose, Bld: 124 — ABNORMAL HIGH
Hemoglobin: 14.3
Sodium: 139
pH, Ven: 7.365 — ABNORMAL HIGH

## 2011-08-19 LAB — DIFFERENTIAL
Basophils Relative: 1
Eosinophils Absolute: 0
Eosinophils Relative: 1
Lymphs Abs: 1
Neutrophils Relative %: 67

## 2011-08-19 LAB — URINE MICROSCOPIC-ADD ON

## 2011-08-25 ENCOUNTER — Ambulatory Visit (INDEPENDENT_AMBULATORY_CARE_PROVIDER_SITE_OTHER): Payer: Medicare Other | Admitting: Internal Medicine

## 2011-08-25 ENCOUNTER — Encounter: Payer: Self-pay | Admitting: Internal Medicine

## 2011-08-25 VITALS — BP 100/60 | HR 57 | Temp 98.5°F | Ht 74.0 in | Wt 286.0 lb

## 2011-08-25 DIAGNOSIS — Z0000 Encounter for general adult medical examination without abnormal findings: Secondary | ICD-10-CM

## 2011-08-25 DIAGNOSIS — E119 Type 2 diabetes mellitus without complications: Secondary | ICD-10-CM

## 2011-08-25 DIAGNOSIS — E785 Hyperlipidemia, unspecified: Secondary | ICD-10-CM

## 2011-08-25 DIAGNOSIS — I10 Essential (primary) hypertension: Secondary | ICD-10-CM

## 2011-08-25 DIAGNOSIS — E039 Hypothyroidism, unspecified: Secondary | ICD-10-CM
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
## 2011-08-25 NOTE — Patient Instructions (Signed)
We will fax the prescription for the Power Wheelchair today Continue all other medications as before Please return in 3 mo with Lab testing done 3-5 days before

## 2011-08-26 LAB — POCT I-STAT, CHEM 8
BUN: 24 — ABNORMAL HIGH
Chloride: 103
Glucose, Bld: 151 — ABNORMAL HIGH
HCT: 39
Potassium: 3.7

## 2011-08-27 LAB — GLUCOSE, CAPILLARY: Glucose-Capillary: 117 — ABNORMAL HIGH

## 2011-08-29 ENCOUNTER — Encounter: Payer: Self-pay | Admitting: Internal Medicine

## 2011-08-29 NOTE — Assessment & Plan Note (Signed)
stable overall by hx and exam, most recent data reviewed with pt, and pt to continue medical treatment as before  Lab Results  Component Value Date   HGBA1C 10.8* 03/01/2011   Declines labs today, but states will do next visit

## 2011-08-29 NOTE — Assessment & Plan Note (Signed)
BP Readings from Last 3 Encounters:  08/25/11 100/60  05/17/11 100/62  04/23/11 126/70   stable overall by hx and exam, most recent data reviewed with pt, and pt to continue medical treatment as before

## 2011-08-29 NOTE — Assessment & Plan Note (Signed)
stable overall by hx and exam, most recent data reviewed with pt, and pt to continue medical treatment as before.  Lab Results  Component Value Date   TSH 7.74* 03/01/2011

## 2011-08-29 NOTE — Assessment & Plan Note (Signed)
stable overall by hx and exam, most recent data reviewed with pt, and pt to continue medical treatment as before  Lab Results  Component Value Date   LDLCALC 52 03/01/2011

## 2011-08-30 LAB — CBC
HCT: 34.5 — ABNORMAL LOW
HCT: 35.5 — ABNORMAL LOW
Hemoglobin: 11.7 — ABNORMAL LOW
Hemoglobin: 12.5 — ABNORMAL LOW
MCHC: 33.7
MCV: 89.8
Platelets: 100 — ABNORMAL LOW
Platelets: 103 — ABNORMAL LOW
Platelets: 134 — ABNORMAL LOW
RBC: 4.16 — ABNORMAL LOW
RDW: 13
RDW: 13.1
RDW: 13.2
WBC: 10.4
WBC: 7.1

## 2011-08-30 LAB — POCT CARDIAC MARKERS
CKMB, poc: 1.5
Myoglobin, poc: 447
Troponin i, poc: <0.05

## 2011-08-30 LAB — URINE CULTURE: Culture: NO GROWTH

## 2011-08-30 LAB — GLUCOSE, CAPILLARY
Glucose-Capillary: 103 — ABNORMAL HIGH
Glucose-Capillary: 101 — ABNORMAL HIGH
Glucose-Capillary: 110 — ABNORMAL HIGH
Glucose-Capillary: 113 — ABNORMAL HIGH
Glucose-Capillary: 162 — ABNORMAL HIGH
Glucose-Capillary: 180 — ABNORMAL HIGH
Glucose-Capillary: 205 — ABNORMAL HIGH
Glucose-Capillary: 243 — ABNORMAL HIGH
Glucose-Capillary: 80

## 2011-08-30 LAB — TROPONIN I: Troponin I: 0.06

## 2011-08-30 LAB — CULTURE, BLOOD (ROUTINE X 2)

## 2011-08-30 LAB — COMPREHENSIVE METABOLIC PANEL
ALT: 16
Alkaline Phosphatase: 43
CO2: 28
Chloride: 102
GFR calc non Af Amer: 42 — ABNORMAL LOW
Glucose, Bld: 213 — ABNORMAL HIGH
Potassium: 3.4 — ABNORMAL LOW
Sodium: 137

## 2011-08-30 LAB — URINALYSIS, ROUTINE W REFLEX MICROSCOPIC
Hgb urine dipstick: NEGATIVE
Nitrite: NEGATIVE
Protein, ur: 30 — AB
Urobilinogen, UA: 1

## 2011-08-30 LAB — SEDIMENTATION RATE: Sed Rate: 35 — ABNORMAL HIGH

## 2011-08-30 LAB — BASIC METABOLIC PANEL
BUN: 13
BUN: 26 — ABNORMAL HIGH
CO2: 27
Calcium: 8.4
Chloride: 104
GFR calc non Af Amer: 60 — ABNORMAL LOW
Glucose, Bld: 130 — ABNORMAL HIGH
Potassium: 3.4 — ABNORMAL LOW
Potassium: 3.7
Sodium: 141

## 2011-08-30 LAB — LIPID PANEL
Cholesterol: 85
HDL: 28 — ABNORMAL LOW
LDL Cholesterol: 49
Total CHOL/HDL Ratio: 3

## 2011-08-30 LAB — DIFFERENTIAL
Basophils Absolute: 0
Lymphocytes Relative: 4 — ABNORMAL LOW
Lymphs Abs: 0.4 — ABNORMAL LOW
Neutro Abs: 9.4 — ABNORMAL HIGH
Neutrophils Relative %: 90 — ABNORMAL HIGH

## 2011-08-30 LAB — CULTURE, ROUTINE-ABSCESS

## 2011-08-30 LAB — D-DIMER, QUANTITATIVE
D-Dimer, Quant: 0.62 — ABNORMAL HIGH
D-Dimer, Quant: 0.62 — ABNORMAL HIGH

## 2011-08-30 LAB — CARDIAC PANEL(CRET KIN+CKTOT+MB+TROPI)
CK, MB: 2.2
CK, MB: 2.2
Relative Index: 0.7
Total CK: 294 — ABNORMAL HIGH

## 2011-08-30 LAB — URINE MICROSCOPIC-ADD ON

## 2011-08-30 LAB — POCT I-STAT, CHEM 8
BUN: 20
Chloride: 102
Potassium: 3.3 — ABNORMAL LOW
Sodium: 139
TCO2: 26

## 2011-08-30 LAB — CK TOTAL AND CKMB (NOT AT ARMC): Total CK: 320 — ABNORMAL HIGH

## 2011-08-30 LAB — PROTIME-INR: INR: 1.3

## 2011-08-31 ENCOUNTER — Telehealth: Payer: Self-pay

## 2011-08-31 LAB — CBC
HCT: 40.9
Hemoglobin: 13.9
RDW: 14.7
WBC: 3 — ABNORMAL LOW

## 2011-08-31 LAB — GLUCOSE, CAPILLARY
Glucose-Capillary: 86
Glucose-Capillary: 95

## 2011-08-31 LAB — POCT I-STAT, CHEM 8
Creatinine, Ser: 1.2
Hemoglobin: 12.6 — ABNORMAL LOW
Potassium: 3.8
Sodium: 139
TCO2: 28

## 2011-08-31 LAB — B-NATRIURETIC PEPTIDE (CONVERTED LAB): Pro B Natriuretic peptide (BNP): 55.3

## 2011-08-31 LAB — DIFFERENTIAL
Basophils Absolute: 0
Basophils Relative: 1
Eosinophils Absolute: 0
Monocytes Absolute: 0.3
Monocytes Relative: 9
Neutrophils Relative %: 62

## 2011-08-31 LAB — URINALYSIS, ROUTINE W REFLEX MICROSCOPIC
Glucose, UA: 100 — AB
Hgb urine dipstick: NEGATIVE
Protein, ur: NEGATIVE
Specific Gravity, Urine: 1.02
pH: 6.5

## 2011-08-31 LAB — COMPREHENSIVE METABOLIC PANEL
ALT: 22
Albumin: 3.3 — ABNORMAL LOW
Alkaline Phosphatase: 38 — ABNORMAL LOW
BUN: 18
Chloride: 105
Glucose, Bld: 97
Potassium: 4.1
Sodium: 137
Total Bilirubin: 0.7
Total Protein: 6.6

## 2011-08-31 LAB — POCT CARDIAC MARKERS
CKMB, poc: 2.8
CKMB, poc: 3.6
CKMB, poc: 4.9
Myoglobin, poc: 278
Troponin i, poc: <0.05
Troponin i, poc: <0.05
Troponin i, poc: <0.05
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
## 2011-08-31 NOTE — Progress Notes (Signed)
  Subjective:    Patient ID: [name], male    DOB: [birth year], 77 y.o.   MRN: [id number]  HPI    Review of Systems     Objective:   Physical Exam        Assessment & Plan:

## 2011-08-31 NOTE — Telephone Encounter (Signed)
Called Delice Bison at [place] [employer] at [phone] to order paper work and they informed would fax asap to be completed by physician.

## 2011-08-31 NOTE — Telephone Encounter (Signed)
Message copied by Pincus Sanes on Tue Aug 31, 2011  9:00 AM ------      Message from: Corwin Levins      Created: Wed Aug 25, 2011  2:53 PM      Regarding: needs power wheelchair paperwork to fill out       Pt did not have paperwork today,  States we should call:  [phone] is the number to call  - [place] [facility] [employer]

## 2011-09-03 LAB — DIFFERENTIAL
Basophils Relative: 0 % (ref 0–1)
Eosinophils Absolute: 0 10*3/uL (ref 0.0–0.7)
Eosinophils Relative: 1 % (ref 0–5)
Lymphs Abs: 1.1 10*3/uL (ref 0.7–4.0)
Monocytes Absolute: 0.3 10*3/uL (ref 0.1–1.0)
Monocytes Relative: 7 % (ref 3–12)
Neutrophils Relative %: 64 % (ref 43–77)

## 2011-09-03 LAB — BASIC METABOLIC PANEL
CO2: 25 mEq/L (ref 19–32)
Chloride: 103 mEq/L (ref 96–112)
GFR calc Af Amer: >60 mL/min (ref >60)
Potassium: 3.7 mEq/L (ref 3.5–5.1)
Sodium: 138 mEq/L (ref 135–145)

## 2011-09-03 LAB — URINALYSIS, ROUTINE W REFLEX MICROSCOPIC
Ketones, ur: NEGATIVE mg/dL
Nitrite: NEGATIVE
pH: 6 (ref 5.0–8.0)

## 2011-09-03 LAB — CBC
HCT: 50.1 % (ref 39.0–52.0)
Hemoglobin: 17 g/dL (ref 13.0–17.0)
MCHC: 33.9 g/dL (ref 30.0–36.0)
MCV: 87.5 fL (ref 78.0–100.0)
RBC: 5.72 MIL/uL (ref 4.22–5.81)
WBC: 3.9 10*3/uL — ABNORMAL LOW (ref 4.0–10.5)

## 2011-09-03 LAB — POCT CARDIAC MARKERS: Troponin i, poc: <0.05 ng/mL (ref 0.00–0.09)

## 2011-09-03 LAB — URINE MICROSCOPIC-ADD ON

## 2011-09-07 LAB — PROTIME-INR
INR: 1
Prothrombin Time: 13.8

## 2011-09-07 LAB — COMPREHENSIVE METABOLIC PANEL
CO2: 29
Calcium: 8.5
Creatinine, Ser: 1.38
GFR calc Af Amer: >60
GFR calc non Af Amer: 50 — ABNORMAL LOW
Glucose, Bld: 223 — ABNORMAL HIGH

## 2011-09-07 LAB — DIFFERENTIAL
Lymphocytes Relative: 22
Lymphs Abs: 0.9
Neutrophils Relative %: 69

## 2011-09-07 LAB — CK TOTAL AND CKMB (NOT AT ARMC)
CK, MB: 3.7
Total CK: 180

## 2011-09-07 LAB — CBC
MCHC: 33.5
MCV: 87.1
Platelets: 146 — ABNORMAL LOW
WBC: 4.3

## 2011-09-07 LAB — APTT: aPTT: 29

## 2011-09-07 LAB — TROPONIN I: Troponin I: 0.04

## 2011-09-08 ENCOUNTER — Encounter: Payer: Self-pay | Admitting: Internal Medicine

## 2011-09-08 ENCOUNTER — Ambulatory Visit (INDEPENDENT_AMBULATORY_CARE_PROVIDER_SITE_OTHER): Payer: Medicare Other | Admitting: Internal Medicine

## 2011-09-08 VITALS — BP 130/80 | HR 65 | Temp 98.5°F | Ht 74.0 in | Wt 286.0 lb

## 2011-09-08 DIAGNOSIS — E119 Type 2 diabetes mellitus without complications: Secondary | ICD-10-CM

## 2011-09-08 DIAGNOSIS — E1369 Other specified diabetes mellitus with other specified complication: Secondary | ICD-10-CM

## 2011-09-08 DIAGNOSIS — L97509 Non-pressure chronic ulcer of other part of unspecified foot with unspecified severity: Secondary | ICD-10-CM

## 2011-09-08 DIAGNOSIS — R609 Edema, unspecified: Secondary | ICD-10-CM

## 2011-09-08 DIAGNOSIS — E13621 Other specified diabetes mellitus with foot ulcer: Secondary | ICD-10-CM | POA: Insufficient documentation

## 2011-09-08 DIAGNOSIS — M25569 Pain in unspecified knee: Secondary | ICD-10-CM

## 2011-09-08 DIAGNOSIS — L97909 Non-pressure chronic ulcer of unspecified part of unspecified lower leg with unspecified severity: Secondary | ICD-10-CM

## 2011-09-08 DIAGNOSIS — M25562 Pain in left knee: Secondary | ICD-10-CM | POA: Insufficient documentation

## 2011-09-08 DIAGNOSIS — R600 Localized edema: Secondary | ICD-10-CM

## 2011-09-08 DIAGNOSIS — I10 Essential (primary) hypertension: Secondary | ICD-10-CM

## 2011-09-08 MED ORDER — SULFAMETHOXAZOLE-TRIMETHOPRIM 800-160 MG PO TABS
1.0000 | ORAL_TABLET | Freq: Two times a day (BID) | ORAL | Status: AC
Start: 2011-09-08 — End: 2011-09-18

## 2011-09-08 MED ORDER — HYDROCODONE-ACETAMINOPHEN 5-325 MG PO TABS: 1.0000 | ORAL_TABLET | Freq: Four times a day (QID) | ORAL | Status: DC | PRN

## 2011-09-08 NOTE — Assessment & Plan Note (Signed)
stable overall by hx and exam, most recent data reviewed with pt, and pt to continue medical treatment as before  BP Readings from Last 3 Encounters:  09/08/11 130/80  08/25/11 100/60  05/17/11 100/62

## 2011-09-08 NOTE — Assessment & Plan Note (Signed)
LLE below the knee, involving the foot somewhat as well - I suspect most likely related to knee pain with sympathetic swelling, but cant r/o dvt - for dopplers venous
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
## 2011-09-08 NOTE — Assessment & Plan Note (Signed)
Uncontrolled recently with difficult control for him, cont to monitor cbg's, declines labs today

## 2011-09-08 NOTE — Assessment & Plan Note (Signed)
With small ulcer mild inflamed, ? Early cellultis , Mild to mod, for antibx course,  to f/u any worsening symptoms or concerns, f/u wound clinic as planned

## 2011-09-08 NOTE — Assessment & Plan Note (Signed)
Acute on chronic after walking more in the past month since his power chair was being repaired, exam actually realtively benign for acute inflammation today, and I think his hip pain likely related to the knee as well; for vicodin prn limited rx, declines ortho evaluation at this time

## 2011-09-08 NOTE — Assessment & Plan Note (Addendum)
With mild at best cellulitis, but doubt this is reason for whole worsening Left calf swelling/discomfort;  For septra course asd, and has appt with wound clinic for oct 12

## 2011-09-08 NOTE — Patient Instructions (Addendum)
Take all new medications as prescribed - the antibiotic, and the pain medication as we discussed You will be contacted regarding the referral for: left leg DVT (blood clot) - to see [facility] today before leaving Please call the phone number [phone] (the PhoneTree System) for results of testing in 2-3 days;  When calling, simply dial the number, and when prompted enter the MRN number above (the Medical Record Number) and the # key, then the message should start. Please keep your appointments with your specialists as you have planned - the wound clinic oct 12 (friday) If the knee or hip pain persist in 1-2 wks, please call for orthopedic referral Continue all other medications as before

## 2011-09-09 ENCOUNTER — Encounter (INDEPENDENT_AMBULATORY_CARE_PROVIDER_SITE_OTHER): Payer: Medicare Other | Admitting: Cardiology

## 2011-09-09 ENCOUNTER — Other Ambulatory Visit: Payer: Self-pay | Admitting: Cardiology

## 2011-09-09 DIAGNOSIS — M79609 Pain in unspecified limb: Secondary | ICD-10-CM

## 2011-09-09 DIAGNOSIS — L539 Erythematous condition, unspecified: Secondary | ICD-10-CM

## 2011-09-09 DIAGNOSIS — M7989 Other specified soft tissue disorders: Secondary | ICD-10-CM

## 2011-09-09 DIAGNOSIS — R609 Edema, unspecified: Secondary | ICD-10-CM

## 2011-09-09 LAB — DIFFERENTIAL
Eosinophils Absolute: 0
Eosinophils Relative: 1
Lymphs Abs: 0.9
Monocytes Relative: 11

## 2011-09-09 LAB — CBC
HCT: 39.8
MCHC: 34
MCV: 86.7
MCV: 86.8
Platelets: 158
Platelets: 146 — ABNORMAL LOW
RDW: 13.7
WBC: 5.3
WBC: 4

## 2011-09-09 LAB — URINALYSIS, ROUTINE W REFLEX MICROSCOPIC
Glucose, UA: NEGATIVE
Ketones, ur: NEGATIVE
Nitrite: NEGATIVE
Protein, ur: NEGATIVE
Urobilinogen, UA: 1

## 2011-09-09 LAB — BASIC METABOLIC PANEL
BUN: 23
Chloride: 101
Potassium: 3.6

## 2011-09-09 LAB — COMPREHENSIVE METABOLIC PANEL
AST: 18
Albumin: 3.4 — ABNORMAL LOW
Calcium: 8.8
Creatinine, Ser: 1.61 — ABNORMAL HIGH
GFR calc Af Amer: 51 — ABNORMAL LOW
Total Protein: 6.6

## 2011-09-09 LAB — URINE MICROSCOPIC-ADD ON

## 2011-09-09 LAB — APTT: aPTT: 29

## 2011-09-10 ENCOUNTER — Encounter (HOSPITAL_BASED_OUTPATIENT_CLINIC_OR_DEPARTMENT_OTHER): Payer: Medicare Other | Attending: General Surgery

## 2011-09-10 DIAGNOSIS — Z950 Presence of cardiac pacemaker: Secondary | ICD-10-CM | POA: Insufficient documentation

## 2011-09-10 DIAGNOSIS — L97509 Non-pressure chronic ulcer of other part of unspecified foot with unspecified severity: Secondary | ICD-10-CM | POA: Insufficient documentation

## 2011-09-10 DIAGNOSIS — Z7982 Long term (current) use of aspirin: Secondary | ICD-10-CM | POA: Insufficient documentation

## 2011-09-10 DIAGNOSIS — I2510 Atherosclerotic heart disease of native coronary artery without angina pectoris: Secondary | ICD-10-CM | POA: Insufficient documentation

## 2011-09-10 DIAGNOSIS — Z79899 Other long term (current) drug therapy: Secondary | ICD-10-CM | POA: Insufficient documentation

## 2011-09-10 DIAGNOSIS — E1169 Type 2 diabetes mellitus with other specified complication: Secondary | ICD-10-CM | POA: Insufficient documentation

## 2011-09-10 DIAGNOSIS — I10 Essential (primary) hypertension: Secondary | ICD-10-CM | POA: Insufficient documentation

## 2011-09-10 DIAGNOSIS — E039 Hypothyroidism, unspecified: Secondary | ICD-10-CM | POA: Insufficient documentation

## 2011-09-10 LAB — WOUND CULTURE

## 2011-09-10 LAB — BASIC METABOLIC PANEL
CO2: 24
CO2: 26
Calcium: 8.7
Calcium: 8.8
Chloride: 105
Creatinine, Ser: 1.91 — ABNORMAL HIGH
GFR calc Af Amer: 42 — ABNORMAL LOW
GFR calc Af Amer: 59 — ABNORMAL LOW
GFR calc non Af Amer: 34 — ABNORMAL LOW
Glucose, Bld: 79
Potassium: 3.7
Sodium: 136
Sodium: 137

## 2011-09-10 LAB — DIFFERENTIAL
Basophils Absolute: 0
Eosinophils Absolute: 0
Lymphocytes Relative: 22
Lymphs Abs: 1
Neutrophils Relative %: 67

## 2011-09-10 LAB — COMPREHENSIVE METABOLIC PANEL
ALT: 20
CO2: 24
Calcium: 8.9
Chloride: 109
Creatinine, Ser: 2.42 — ABNORMAL HIGH
GFR calc non Af Amer: 26 — ABNORMAL LOW
Glucose, Bld: 142 — ABNORMAL HIGH
Total Bilirubin: 0.8

## 2011-09-10 LAB — CULTURE, BLOOD (ROUTINE X 2)

## 2011-09-10 LAB — CBC
HCT: 38.5 — ABNORMAL LOW
Hemoglobin: 12.9 — ABNORMAL LOW
Hemoglobin: 13
MCHC: 33.8
MCHC: 34.5
MCV: 86.8
RBC: 4.35
RBC: 4.44
WBC: 4.2

## 2011-09-10 LAB — SEDIMENTATION RATE: Sed Rate: 10

## 2011-09-10 LAB — HEMOGLOBIN A1C: Mean Plasma Glucose: 218

## 2011-09-17 ENCOUNTER — Other Ambulatory Visit (HOSPITAL_BASED_OUTPATIENT_CLINIC_OR_DEPARTMENT_OTHER): Payer: Self-pay | Admitting: General Surgery

## 2011-09-17 LAB — GLUCOSE, CAPILLARY: Glucose-Capillary: 198 mg/dL — ABNORMAL HIGH (ref 70–99)

## 2011-09-22 ENCOUNTER — Ambulatory Visit: Payer: Medicare Other | Admitting: Internal Medicine

## 2011-09-23 ENCOUNTER — Ambulatory Visit: Payer: Medicare Other | Admitting: Internal Medicine

## 2011-10-06 ENCOUNTER — Other Ambulatory Visit (INDEPENDENT_AMBULATORY_CARE_PROVIDER_SITE_OTHER): Payer: Medicare Other

## 2011-10-06 ENCOUNTER — Ambulatory Visit (INDEPENDENT_AMBULATORY_CARE_PROVIDER_SITE_OTHER): Payer: Medicare Other | Admitting: Internal Medicine

## 2011-10-06 ENCOUNTER — Encounter: Payer: Self-pay | Admitting: Internal Medicine

## 2011-10-06 VITALS — BP 110/68 | HR 67 | Temp 98.7°F | Wt 285.0 lb

## 2011-10-06 DIAGNOSIS — M25562 Pain in left knee: Secondary | ICD-10-CM

## 2011-10-06 DIAGNOSIS — Z0000 Encounter for general adult medical examination without abnormal findings: Secondary | ICD-10-CM

## 2011-10-06 DIAGNOSIS — E119 Type 2 diabetes mellitus without complications: Secondary | ICD-10-CM

## 2011-10-06 DIAGNOSIS — M25569 Pain in unspecified knee: Secondary | ICD-10-CM

## 2011-10-06 DIAGNOSIS — Z125 Encounter for screening for malignant neoplasm of prostate: Secondary | ICD-10-CM

## 2011-10-06 LAB — CBC WITH DIFFERENTIAL/PLATELET
Basophils Relative: 0.3 % (ref 0.0–3.0)
Eosinophils Relative: 2.3 % (ref 0.0–5.0)
Hemoglobin: 13.4 g/dL (ref 13.0–17.0)
Lymphocytes Relative: 26.1 % (ref 12.0–46.0)
MCHC: 33.2 g/dL (ref 30.0–36.0)
MCV: 89.6 fl (ref 78.0–100.0)
Neutro Abs: 2.5 10*3/uL (ref 1.4–7.7)
Neutrophils Relative %: 61.7 % (ref 43.0–77.0)
RBC: 4.52 Mil/uL (ref 4.22–5.81)
WBC: 4.1 10*3/uL — ABNORMAL LOW (ref 4.5–10.5)

## 2011-10-06 LAB — BASIC METABOLIC PANEL
BUN: 20 mg/dL (ref 6–23)
Calcium: 9.5 mg/dL (ref 8.4–10.5)
Chloride: 101 mEq/L (ref 96–112)
Creatinine, Ser: 1.5 mg/dL (ref 0.4–1.5)

## 2011-10-06 LAB — LIPID PANEL: Total CHOL/HDL Ratio: 3

## 2011-10-06 LAB — HEPATIC FUNCTION PANEL
ALT: 14 U/L (ref 0–53)
Bilirubin, Direct: 0.1 mg/dL (ref 0.0–0.3)
Total Bilirubin: 0.6 mg/dL (ref 0.3–1.2)

## 2011-10-06 LAB — MICROALBUMIN / CREATININE URINE RATIO
Creatinine,U: 200.6 mg/dL
Microalb Creat Ratio: 2.8 mg/g (ref 0.0–30.0)
Microalb, Ur: 5.6 mg/dL — ABNORMAL HIGH (ref 0.0–1.9)

## 2011-10-06 LAB — URINALYSIS, ROUTINE W REFLEX MICROSCOPIC
Nitrite: NEGATIVE
Urine Glucose: 100
pH: 6 (ref 5.0–8.0)

## 2011-10-06 LAB — HEMOGLOBIN A1C: Hgb A1c MFr Bld: 10.1 % — ABNORMAL HIGH (ref 4.6–6.5)

## 2011-10-06 LAB — PSA: PSA: 7.64 ng/mL — ABNORMAL HIGH (ref 0.10–4.00)

## 2011-10-06 NOTE — Assessment & Plan Note (Signed)
Pt does ambulate some, and has giveaway with increased pain lately, requests ortho evaluation - will do

## 2011-10-06 NOTE — Assessment & Plan Note (Signed)

## 2011-10-06 NOTE — Patient Instructions (Signed)
Continue all other medications as before Please go to LAB in the Basement for the blood and/or urine tests to be done today Please call the phone number [phone] (the PhoneTree System) for results of testing in 2-3 days;  When calling, simply dial the number, and when prompted enter the MRN number above (the Medical Record Number) and the # key, then the message should start. You will be contacted regarding the referral for: orthopedic for the left knee Please return in 6 months, or sooner if needed

## 2011-10-07 ENCOUNTER — Other Ambulatory Visit: Payer: Self-pay | Admitting: Internal Medicine

## 2011-10-07 MED ORDER — CEPHALEXIN 500 MG PO CAPS: 500.0000 mg | ORAL_CAPSULE | Freq: Four times a day (QID) | ORAL | Status: AC

## 2011-10-08 ENCOUNTER — Encounter (HOSPITAL_BASED_OUTPATIENT_CLINIC_OR_DEPARTMENT_OTHER): Payer: Medicare Other | Attending: General Surgery

## 2011-10-08 DIAGNOSIS — Z79899 Other long term (current) drug therapy: Secondary | ICD-10-CM | POA: Insufficient documentation

## 2011-10-08 DIAGNOSIS — E039 Hypothyroidism, unspecified: Secondary | ICD-10-CM | POA: Insufficient documentation

## 2011-10-08 DIAGNOSIS — L97509 Non-pressure chronic ulcer of other part of unspecified foot with unspecified severity: Secondary | ICD-10-CM | POA: Insufficient documentation

## 2011-10-08 DIAGNOSIS — I10 Essential (primary) hypertension: Secondary | ICD-10-CM | POA: Insufficient documentation

## 2011-10-08 DIAGNOSIS — Z950 Presence of cardiac pacemaker: Secondary | ICD-10-CM | POA: Insufficient documentation

## 2011-10-08 DIAGNOSIS — I2510 Atherosclerotic heart disease of native coronary artery without angina pectoris: Secondary | ICD-10-CM | POA: Insufficient documentation

## 2011-10-08 DIAGNOSIS — E1169 Type 2 diabetes mellitus with other specified complication: Secondary | ICD-10-CM | POA: Insufficient documentation

## 2011-10-08 DIAGNOSIS — Z7982 Long term (current) use of aspirin: Secondary | ICD-10-CM | POA: Insufficient documentation

## 2011-10-10 ENCOUNTER — Telehealth: Payer: Self-pay | Admitting: Internal Medicine

## 2011-10-10 ENCOUNTER — Encounter: Payer: Self-pay | Admitting: Internal Medicine

## 2011-10-10 MED ORDER — INSULIN GLARGINE 100 UNIT/ML ~~LOC~~ SOLN: 90.0000 [IU] | Freq: Every day | SUBCUTANEOUS | Status: DC

## 2011-10-10 MED ORDER — LEVOTHYROXINE SODIUM 75 MCG PO TABS: 75.0000 ug | ORAL_TABLET | Freq: Every day | ORAL | Status: DC

## 2011-10-10 MED ORDER — LEVOTHYROXINE SODIUM 100 MCG PO TABS: 100.0000 ug | ORAL_TABLET | Freq: Every day | ORAL | Status: DC
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
## 2011-10-10 NOTE — Telephone Encounter (Signed)
Message copied by Corwin Levins on Sun Oct 10, 2011  8:34 PM ------      Message from: Scharlene Gloss B      Created: Thu Oct 07, 2011  8:55 AM       Called the patient informed of results. He ran out of thyroid medication last week (had been off for 3 days) and the Lantus he takes most everyday, He may miss a day or two every two weeks.

## 2011-10-10 NOTE — Telephone Encounter (Signed)
Ok to increase the lantus to 90 units (from the 70 he takes now)  AND:  Needs to stop the levothyroxine 150 mcg per daym       Start :  Levothyroxine 100 AND 75 mcg per day  Robin to inform pt, I will do rx

## 2011-10-11 NOTE — Telephone Encounter (Signed)
Called and informed the patient of medication changes.

## 2011-10-20 ENCOUNTER — Other Ambulatory Visit (HOSPITAL_BASED_OUTPATIENT_CLINIC_OR_DEPARTMENT_OTHER): Payer: Self-pay | Admitting: General Surgery

## 2011-10-20 LAB — GLUCOSE, CAPILLARY: Glucose-Capillary: 202 mg/dL — ABNORMAL HIGH (ref 70–99)

## 2011-10-26 NOTE — Progress Notes (Signed)
[facility]  NAME:  [name], [name]                       ACCOUNT NO.:  MEDICAL RECORD NO.:  [id number]      DATE OF BIRTH:  [birth year]  PHYSICIAN:  Wayland Denis, DO       VISIT DATE:  10/20/2011                                  OFFICE VISIT   [name] is an 77 year old gentleman who is here for followup on a left lower extremity diabetic foot ulcer.  He has been using Silvercel and TCA, and he is healing very nicely with a decrease in the depth and size of the ulceration.  There has been no change in his medications or social history.  On exam, he is alert, oriented, cooperative, not in any acute distress. He is very pleasant.  His pupils are equal.  Extraocular muscles are intact.  His breathing is unlabored.  There is no cervical lymphadenopathy.  We will continue with the Silvercel and TCA, and see him back in a week.     Wayland Denis, DO     CS/MEDQ  D:  10/20/2011  T:  10/20/2011  Job:  [id number]

## 2011-10-30 ENCOUNTER — Other Ambulatory Visit: Payer: Self-pay | Admitting: Internal Medicine

## 2011-11-01 ENCOUNTER — Other Ambulatory Visit: Payer: Self-pay

## 2011-11-01 MED ORDER — INSULIN GLARGINE 100 UNIT/ML ~~LOC~~ SOLN: 90.0000 [IU] | Freq: Every day | SUBCUTANEOUS | Status: DC

## 2011-11-02 ENCOUNTER — Telehealth: Payer: Self-pay

## 2011-11-02 NOTE — Telephone Encounter (Signed)
Message copied by Pincus Sanes on Tue Nov 02, 2011  9:17 AM ------      Message from: Lorrin Mais      Created: Tue Nov 02, 2011  8:12 AM      Regarding: Prescription refill request       Mr. Mccollam called this morning.  He stated that when he went to pick up his insulin from the pharmacy yesterday, they told him that [facility] had denied it.  He called to see if you will call the pharmacy and approve the Rx.  Thanks.Marland KitchenMarland Kitchen

## 2011-11-02 NOTE — Telephone Encounter (Signed)
Called the pharmacy and gave verbal refill as was filled yesterday 11/01/2011 called the patient informed as well.

## 2011-11-22 IMAGING — CR DG CHEST 2V
1 series · 1 of 1 positions shown · non-contrast
Comparison: 04/09/2009.

CLINICAL DATA: Cough and fever.

CHEST - 2 VIEW

[view not recorded]
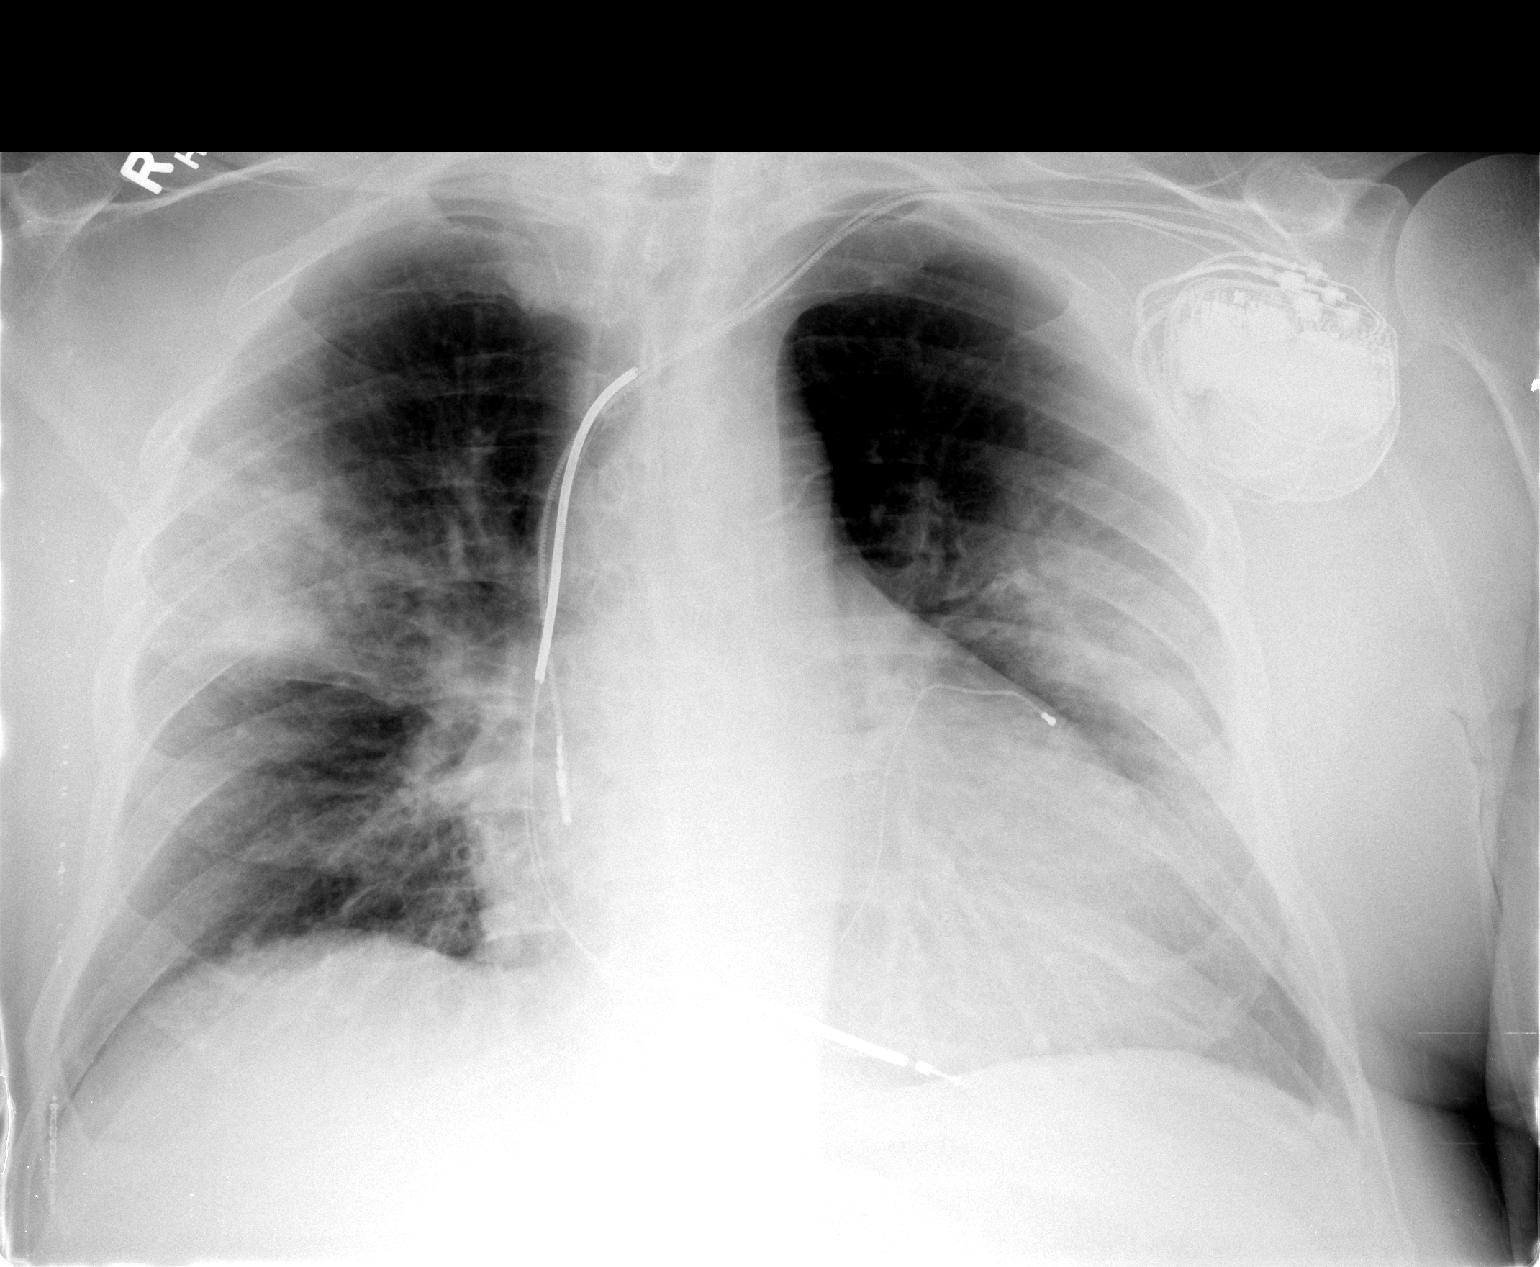

[1 of 1 positions shown; findings below may reference images not displayed]

FINDINGS: Left subclavian AICD leads are unchanged.  The heart size
and mediastinal contours are stable.  There are new bilateral upper
lobe air space opacities compatible with pneumonia.  No edema or
significant pleural effusion is demonstrated.
IMPRESSION: Multilobar pneumonia with bilateral upper lobe involvement.
Radiographic followup is recommended.

## 2011-12-20 ENCOUNTER — Inpatient Hospital Stay (HOSPITAL_COMMUNITY)
Admission: EM | Admit: 2011-12-20 | Discharge: 2011-12-23 | DRG: 690 | Disposition: A | Discharge status: Home / Self Care | Payer: Medicare Other | Attending: Family Medicine | Admitting: Family Medicine

## 2011-12-20 ENCOUNTER — Emergency Department (HOSPITAL_COMMUNITY): Payer: Medicare Other

## 2011-12-20 ENCOUNTER — Other Ambulatory Visit: Payer: Self-pay

## 2011-12-20 ENCOUNTER — Encounter (HOSPITAL_COMMUNITY): Payer: Self-pay | Admitting: Emergency Medicine

## 2011-12-20 DIAGNOSIS — I252 Old myocardial infarction: Secondary | ICD-10-CM

## 2011-12-20 DIAGNOSIS — E119 Type 2 diabetes mellitus without complications: Secondary | ICD-10-CM | POA: Diagnosis present

## 2011-12-20 DIAGNOSIS — I2589 Other forms of chronic ischemic heart disease: Secondary | ICD-10-CM | POA: Diagnosis present

## 2011-12-20 DIAGNOSIS — R42 Dizziness and giddiness: Secondary | ICD-10-CM

## 2011-12-20 DIAGNOSIS — I509 Heart failure, unspecified: Secondary | ICD-10-CM | POA: Diagnosis present

## 2011-12-20 DIAGNOSIS — I5022 Chronic systolic (congestive) heart failure: Secondary | ICD-10-CM

## 2011-12-20 DIAGNOSIS — Z95810 Presence of automatic (implantable) cardiac defibrillator: Secondary | ICD-10-CM

## 2011-12-20 DIAGNOSIS — D696 Thrombocytopenia, unspecified: Secondary | ICD-10-CM | POA: Diagnosis present

## 2011-12-20 DIAGNOSIS — M25562 Pain in left knee: Secondary | ICD-10-CM

## 2011-12-20 DIAGNOSIS — E785 Hyperlipidemia, unspecified: Secondary | ICD-10-CM

## 2011-12-20 DIAGNOSIS — N390 Urinary tract infection, site not specified: Principal | ICD-10-CM

## 2011-12-20 DIAGNOSIS — R4182 Altered mental status, unspecified: Secondary | ICD-10-CM

## 2011-12-20 DIAGNOSIS — R110 Nausea: Secondary | ICD-10-CM

## 2011-12-20 DIAGNOSIS — I10 Essential (primary) hypertension: Secondary | ICD-10-CM

## 2011-12-20 LAB — URINE CULTURE
Colony Count: 4000
Culture  Setup Time: 201301220108

## 2011-12-20 LAB — URINALYSIS, ROUTINE W REFLEX MICROSCOPIC
Bilirubin Urine: NEGATIVE
Nitrite: NEGATIVE
Specific Gravity, Urine: 1.017 (ref 1.005–1.030)
Urobilinogen, UA: 1 mg/dL (ref 0.0–1.0)

## 2011-12-20 LAB — CBC
HCT: 37.6 % — ABNORMAL LOW (ref 39.0–52.0)
MCHC: 33.2 g/dL (ref 30.0–36.0)
MCV: 85.8 fL (ref 78.0–100.0)
Platelets: 136 10*3/uL — ABNORMAL LOW (ref 150–400)
RDW: 13.5 % (ref 11.5–15.5)
WBC: 4.6 10*3/uL (ref 4.0–10.5)

## 2011-12-20 LAB — COMPREHENSIVE METABOLIC PANEL
ALT: 13 U/L (ref 0–53)
Albumin: 3.3 g/dL — ABNORMAL LOW (ref 3.5–5.2)
Alkaline Phosphatase: 58 U/L (ref 39–117)
BUN: 15 mg/dL (ref 6–23)
Chloride: 106 mEq/L (ref 96–112)
GFR calc Af Amer: 65 mL/min — ABNORMAL LOW (ref >90)
Glucose, Bld: 99 mg/dL (ref 70–99)
Potassium: 4 mEq/L (ref 3.5–5.1)
Sodium: 139 mEq/L (ref 135–145)
Total Bilirubin: 0.3 mg/dL (ref 0.3–1.2)
Total Protein: 7 g/dL (ref 6.0–8.3)

## 2011-12-20 LAB — GLUCOSE, CAPILLARY
Glucose-Capillary: 103 mg/dL — ABNORMAL HIGH (ref 70–99)
Glucose-Capillary: 88 mg/dL (ref 70–99)

## 2011-12-20 LAB — URINE MICROSCOPIC-ADD ON

## 2011-12-20 MED ORDER — FINASTERIDE 5 MG PO TABS
5.0000 mg | ORAL_TABLET | Freq: Every day | ORAL | Status: DC
  Administered 2011-12-21 – 2011-12-23 (×3): 5 mg via ORAL
  Filled 2011-12-20 (×4): qty 1

## 2011-12-20 MED ORDER — DUTASTERIDE 0.5 MG PO CAPS
0.5000 mg | ORAL_CAPSULE | Freq: Every day | ORAL | Status: DC
  Filled 2011-12-20 (×3): qty 1

## 2011-12-20 MED ORDER — CLOPIDOGREL BISULFATE 75 MG PO TABS
75.0000 mg | ORAL_TABLET | Freq: Every day | ORAL | Status: DC
  Administered 2011-12-21 – 2011-12-23 (×3): 75 mg via ORAL
  Filled 2011-12-20 (×4): qty 1

## 2011-12-20 MED ORDER — FUROSEMIDE 40 MG PO TABS
40.0000 mg | ORAL_TABLET | Freq: Two times a day (BID) | ORAL | Status: DC
  Administered 2011-12-20 – 2011-12-23 (×6): 40 mg via ORAL
  Filled 2011-12-20 (×7): qty 1

## 2011-12-20 MED ORDER — ROSUVASTATIN CALCIUM 20 MG PO TABS
20.0000 mg | ORAL_TABLET | Freq: Every day | ORAL | Status: DC
  Administered 2011-12-21 – 2011-12-23 (×3): 20 mg via ORAL
  Filled 2011-12-20 (×4): qty 1

## 2011-12-20 MED ORDER — ACETAMINOPHEN 650 MG RE SUPP: 650.0000 mg | Freq: Four times a day (QID) | RECTAL | Status: DC | PRN

## 2011-12-20 MED ORDER — POTASSIUM CHLORIDE CRYS ER 20 MEQ PO TBCR
20.0000 meq | EXTENDED_RELEASE_TABLET | Freq: Two times a day (BID) | ORAL | Status: DC
  Administered 2011-12-20 – 2011-12-23 (×6): 20 meq via ORAL
  Filled 2011-12-20 (×7): qty 1

## 2011-12-20 MED ORDER — ONDANSETRON HCL 4 MG PO TABS: 4.0000 mg | ORAL_TABLET | Freq: Four times a day (QID) | ORAL | Status: DC | PRN

## 2011-12-20 MED ORDER — ISOSORBIDE DINITRATE 30 MG PO TABS
30.0000 mg | ORAL_TABLET | Freq: Two times a day (BID) | ORAL | Status: DC
  Administered 2011-12-20 – 2011-12-23 (×6): 30 mg via ORAL
  Filled 2011-12-20 (×7): qty 1

## 2011-12-20 MED ORDER — ASPIRIN EC 81 MG PO TBEC
81.0000 mg | DELAYED_RELEASE_TABLET | Freq: Every day | ORAL | Status: DC
  Administered 2011-12-21 – 2011-12-22 (×2): 81 mg via ORAL
  Filled 2011-12-20 (×4): qty 1

## 2011-12-20 MED ORDER — LEVOTHYROXINE SODIUM 150 MCG PO TABS
150.0000 ug | ORAL_TABLET | Freq: Every day | ORAL | Status: DC
  Administered 2011-12-21 – 2011-12-23 (×3): 150 ug via ORAL
  Filled 2011-12-20 (×4): qty 1

## 2011-12-20 MED ORDER — DEXTROSE 5 % IV SOLN
1.0000 g | Freq: Once | INTRAVENOUS | Status: AC
  Administered 2011-12-20: 1 g via INTRAVENOUS
  Filled 2011-12-20: qty 10

## 2011-12-20 MED ORDER — DEXTROSE 5 % IV SOLN
1.0000 g | INTRAVENOUS | Status: DC
  Administered 2011-12-21 – 2011-12-22 (×2): 1 g via INTRAVENOUS
  Filled 2011-12-20 (×3): qty 10

## 2011-12-20 MED ORDER — ONDANSETRON HCL 4 MG PO TABS: 4.0000 mg | ORAL_TABLET | Freq: Four times a day (QID) | ORAL | Status: AC

## 2011-12-20 MED ORDER — SODIUM CHLORIDE 0.9 % IV BOLUS (SEPSIS)
500.0000 mL | Freq: Once | INTRAVENOUS | Status: AC
  Administered 2011-12-20: 500 mL via INTRAVENOUS

## 2011-12-20 MED ORDER — HYDROCODONE-ACETAMINOPHEN 5-325 MG PO TABS: 1.0000 | ORAL_TABLET | Freq: Four times a day (QID) | ORAL | Status: DC | PRN

## 2011-12-20 MED ORDER — INSULIN GLARGINE 100 UNIT/ML ~~LOC~~ SOLN
90.0000 [IU] | Freq: Every day | SUBCUTANEOUS | Status: DC
  Filled 2011-12-20: qty 3

## 2011-12-20 MED ORDER — ENOXAPARIN SODIUM 30 MG/0.3ML ~~LOC~~ SOLN
30.0000 mg | SUBCUTANEOUS | Status: DC
  Administered 2011-12-20 – 2011-12-21 (×2): 30 mg via SUBCUTANEOUS
  Filled 2011-12-20 (×3): qty 0.3

## 2011-12-20 MED ORDER — NITROGLYCERIN 0.3 MG SL SUBL: 0.3000 mg | SUBLINGUAL_TABLET | SUBLINGUAL | Status: DC | PRN

## 2011-12-20 MED ORDER — PANTOPRAZOLE SODIUM 40 MG PO TBEC
40.0000 mg | DELAYED_RELEASE_TABLET | Freq: Every day | ORAL | Status: DC
  Administered 2011-12-21 – 2011-12-23 (×3): 40 mg via ORAL
  Filled 2011-12-20 (×4): qty 1

## 2011-12-20 MED ORDER — ONDANSETRON HCL 4 MG/2ML IJ SOLN
4.0000 mg | Freq: Four times a day (QID) | INTRAMUSCULAR | Status: DC | PRN
  Administered 2011-12-20: 4 mg via INTRAVENOUS
  Filled 2011-12-20: qty 2

## 2011-12-20 MED ORDER — CARVEDILOL 25 MG PO TABS
25.0000 mg | ORAL_TABLET | Freq: Two times a day (BID) | ORAL | Status: DC
  Administered 2011-12-20 – 2011-12-23 (×6): 25 mg via ORAL
  Filled 2011-12-20 (×7): qty 1

## 2011-12-20 MED ORDER — ACETAMINOPHEN 325 MG PO TABS: 650.0000 mg | ORAL_TABLET | Freq: Four times a day (QID) | ORAL | Status: DC | PRN
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
## 2011-12-20 NOTE — ED Notes (Signed)
Talked to secretary about pt's missing room assignment. Secretary to request bed again.  Unable to call report at this time

## 2011-12-20 NOTE — ED Notes (Signed)
cbg 103 
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
## 2011-12-20 NOTE — ED Notes (Signed)
Pt was placed on antibioitcs 5 days ago for an uti, and today took meds with out eating and is nausea, does have slurred speech with a hx of cva, pt states that he lives with a room mate but has been taking meds on his own. Pt does smell of urine, c/o dizzyness , productive cough,

## 2011-12-21 LAB — BASIC METABOLIC PANEL
CO2: 27 mEq/L (ref 19–32)
Calcium: 8.8 mg/dL (ref 8.4–10.5)
Chloride: 103 mEq/L (ref 96–112)
Glucose, Bld: 123 mg/dL — ABNORMAL HIGH (ref 70–99)
Potassium: 3.8 mEq/L (ref 3.5–5.1)
Sodium: 138 mEq/L (ref 135–145)

## 2011-12-21 LAB — CBC
Hemoglobin: 12.8 g/dL — ABNORMAL LOW (ref 13.0–17.0)
MCH: 28.4 pg (ref 26.0–34.0)
Platelets: 134 10*3/uL — ABNORMAL LOW (ref 150–400)
RBC: 4.5 MIL/uL (ref 4.22–5.81)
WBC: 5.3 10*3/uL (ref 4.0–10.5)

## 2011-12-21 LAB — MRSA PCR SCREENING: MRSA by PCR: NEGATIVE

## 2011-12-21 LAB — GLUCOSE, CAPILLARY: Glucose-Capillary: 85 mg/dL (ref 70–99)

## 2011-12-21 MED ORDER — INSULIN GLARGINE 100 UNIT/ML ~~LOC~~ SOLN
60.0000 [IU] | Freq: Every day | SUBCUTANEOUS | Status: DC
  Administered 2011-12-21 – 2011-12-22 (×2): 60 [IU] via SUBCUTANEOUS
  Filled 2011-12-21: qty 3

## 2011-12-21 MED ORDER — DOCUSATE SODIUM 100 MG PO CAPS
100.0000 mg | ORAL_CAPSULE | Freq: Two times a day (BID) | ORAL | Status: DC | PRN
  Filled 2011-12-21: qty 1

## 2011-12-21 MED ORDER — POLYETHYLENE GLYCOL 3350 17 G PO PACK
17.0000 g | PACK | Freq: Every day | ORAL | Status: DC
  Administered 2011-12-23: 17 g via ORAL
  Filled 2011-12-21 (×3): qty 1

## 2011-12-21 MED ORDER — INSULIN GLARGINE 100 UNIT/ML ~~LOC~~ SOLN: 60.0000 [IU] | Freq: Every day | SUBCUTANEOUS | Status: DC

## 2011-12-21 NOTE — Progress Notes (Signed)
EMMERSON TADDEI is a 78 y.o. male patient admitted with confusion, nausea/vomiting, found to have uti. He is more with it today and feels better. He says he does not take avodart at home as he blieives he was started on it erroneously and does not have prostate cancer. complaints of constipation.     1. UTI (lower urinary tract infection)   2. Nausea   3. Altered mental status   4. Left knee pain   5. Chronic systolic heart failure   6. Essential hypertension, benign     Past Medical History  Diagnosis Date  . Candidiasis of the esophagus 10/26/2007  . Benign neoplasm of esophagus 12/29/2006  . HYPOTHYROIDISM 10/26/2007  . DIABETES MELLITUS, TYPE II 06/11/2007  . HYPERLIPIDEMIA 06/11/2007  . DELUSIONAL DISORDER 06/11/2007  . HYPERTENSION 06/11/2007  . AMI 06/11/2007  . MYOCARDIAL INFARCTION, HX OF 10/26/2007  . CORONARY ARTERY DISEASE 06/11/2007  . SYSTOLIC HEART FAILURE, CHRONIC 01/13/2009  . PERIPHERAL VASCULAR DISEASE 10/26/2007  . ALLERGIC RHINITIS 10/26/2007  . PNEUMONIA, COMMUNITY ACQUIRED, PNEUMOCOCCAL 01/23/2010  . ACUTE GINGIVITIS PLAQUE INDUCED 07/30/2009  . ABSCESS, MOUTH 01/01/2009  . ESOPHAGITIS 06/17/2008  . GERD 06/11/2007  . DIVERTICULOSIS, COLON 10/26/2007  . RENAL INSUFFICIENCY 10/26/2007  . UTI 12/07/2007  . BENIGN PROSTATIC HYPERTROPHY 08/27/2009  . BOILS, RECURRENT 10/02/2008  . Cellulitis and abscess of buttock 09/10/2008  . FOOT ULCER, LEFT 10/27/2007  . BACK PAIN 11/14/2007  . ARTERIOVENOUS MALFORMATION, COLON 10/26/2007  . HYPERSOMNIA 05/30/2008  . FATIGUE 05/30/2008  . PARESTHESIA 05/30/2008  . CHEST PAIN 11/26/2008  . NAUSEA 08/27/2009  . Dysuria 08/27/2009  . URINARY RETENTION 12/07/2007  . PSA, INCREASED 10/26/2007  . 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 Foreign body in esophagus 01/29/2005  . PROSTATE CANCER, HX OF 07/30/2009  . COLONIC POLYPS, HX OF 10/26/2007  . AUTOMATIC IMPLANTABLE CARDIAC DEFIBRILLATOR SITU 05/27/2009   Current  Facility-Administered Medications  Medication Dose Route Frequency Provider Last Rate Last Dose  . acetaminophen (TYLENOL) tablet 650 mg  650 mg Oral Q6H PRN Zannie Cove, MD       Or  . acetaminophen (TYLENOL) suppository 650 mg  650 mg Rectal Q6H PRN Zannie Cove, MD      . aspirin EC tablet 81 mg  81 mg Oral Daily Zannie Cove, MD   81 mg at 12/21/11 1054  . carvedilol (COREG) tablet 25 mg  25 mg Oral BID WC Zannie Cove, MD   25 mg at 12/21/11 1801  . cefTRIAXone (ROCEPHIN) 1 g in dextrose 5 % 50 mL IVPB  1 g Intravenous Q24H Zannie Cove, MD   1 g at 12/21/11 1801  . clopidogrel (PLAVIX) tablet 75 mg  75 mg Oral Daily Zannie Cove, MD   75 mg at 12/21/11 1054  . docusate sodium (COLACE) capsule 100 mg  100 mg Oral BID PRN Harman Ferrin, MD      . enoxaparin (LOVENOX) injection 30 mg  30 mg Subcutaneous Q24H Zannie Cove, MD   30 mg at 12/21/11 2201  . finasteride (PROSCAR) tablet 5 mg  5 mg Oral Daily Zannie Cove, MD   5 mg at 12/21/11 1054  . furosemide (LASIX) tablet 40 mg  40 mg Oral BID Zannie Cove, MD   40 mg at 12/21/11 1801  . HYDROcodone-acetaminophen (NORCO) 5-325 MG per tablet 1 tablet  1 tablet Oral Q6H PRN Zannie Cove, MD      . insulin glargine (LANTUS) injection 60 Units  60 Units  Subcutaneous QHS Conley Canal, MD   60 Units at 12/21/11 2202  . isosorbide dinitrate (ISORDIL) tablet 30 mg  30 mg Oral BID Zannie Cove, MD   30 mg at 12/21/11 2201  . levothyroxine (SYNTHROID, LEVOTHROID) tablet 150 mcg  150 mcg Oral Daily Zannie Cove, MD   150 mcg at 12/21/11 1054  . nitroGLYCERIN (NITROSTAT) SL tablet 0.3 mg  0.3 mg Sublingual Q5 min PRN Zannie Cove, MD      . ondansetron [facility]) tablet 4 mg  4 mg Oral Q6H PRN Zannie Cove, MD       Or  . ondansetron [facility]) injection 4 mg  4 mg Intravenous Q6H PRN Zannie Cove, MD   4 mg at 12/20/11 2217  . pantoprazole (PROTONIX) EC tablet 40 mg  40 mg Oral Q1200 Zannie Cove, MD   40 mg at 12/21/11 1209   . polyethylene glycol (MIRALAX / GLYCOLAX) packet 17 g  17 g Oral Daily Cross Jorge, MD      . potassium chloride SA (K-DUR,KLOR-CON) CR tablet 20 mEq  20 mEq Oral BID Zannie Cove, MD   20 mEq at 12/21/11 2202  . rosuvastatin (CRESTOR) tablet 20 mg  20 mg Oral Daily Zannie Cove, MD   20 mg at 12/21/11 1054  . DISCONTD: dutasteride (AVODART) capsule 0.5 mg  0.5 mg Oral Daily Zannie Cove, MD      . DISCONTD: insulin glargine (LANTUS) injection 60 Units  60 Units Subcutaneous Daily Zannie Cove, MD      . DISCONTD: insulin glargine (LANTUS) injection 90 Units  90 Units Subcutaneous Daily Zannie Cove, MD       Allergies  Allergen Reactions  . Oxycodone-Acetaminophen   . Oxycodone-Aspirin    Active Problems:  Nausea  UTI (lower urinary tract infection)  Dizziness   Vital signs in last 24 hours: Temp:  [98.2 F (36.8 C)-98.8 F (37.1 C)] 98.8 F (37.1 C) (01/22 2139) Pulse Rate:  [62-65] 62  (01/22 2139) Resp:  [18-20] 18  (01/22 2139) BP: (151-163)/(64-68) 163/67 mmHg (01/22 2139) SpO2:  [92 %-94 %] 92 % (01/22 2139) Weight change:  Last BM Date: 12/20/11  Intake/Output from previous day: 01/21 0701 - 01/22 0700 In: -  Out: 500 [Urine:500] Intake/Output this shift: Total I/O In: -  Out: 300 [Urine:300]  Lab Results:  [facility] 12/21/11 0313 12/20/11 1330  WBC 5.3 4.6  HGB 12.8* 12.5*  HCT 38.4* 37.6*  PLT 134* 136*   BMET  Basename 12/21/11 0313 12/20/11 1330  NA 138 139  K 3.8 4.0  CL 103 106  CO2 27 28  GLUCOSE 123* 99  BUN 13 15  CREATININE 1.15 1.18  CALCIUM 8.8 8.7    Studies/Results: Dg Chest 2 View  12/20/2011  *RADIOLOGY REPORT*  Clinical Data: Weakness.  Mental status changes.  CHEST - 2 VIEW  Comparison: 06/27/2011.  Findings: AICD / biventricular pacer is in place.  Leads appear in a grossly similar position to that of the prior exam.  Cardiomegaly.  Pulmonary vascular congestion/mild pulmonary edema.  No gross pneumothorax or  segmental consolidation.  Calcified tortuous aorta.  IMPRESSION: AICD / biventricular pacer is in place.  Leads appear in a grossly similar position to that of the prior exam.  Cardiomegaly.   Pulmonary vascular congestion/mild pulmonary edema.  Calcified tortuous aorta.  Original Report Authenticated By: Fuller Canada, M.D.   Ct Head Wo Contrast  12/20/2011  *RADIOLOGY REPORT*  Clinical Data: Altered mental status.  CT HEAD WITHOUT  CONTRAST  Technique:  Contiguous axial images were obtained from the base of the skull through the vertex without contrast.  Comparison: 03/19/2011  Findings: There is no evidence for acute hemorrhage, hydrocephalus, mass lesion, or abnormal extra-axial fluid collection.  No definite CT evidence for acute infarction.  Diffuse loss of parenchymal volume is consistent with atrophy. Patchy low attenuation in the deep hemispheric and periventricular white matter is nonspecific, but likely reflects chronic microvascular ischemic demyelination.  Polypoid mucosal disease is seen in the right maxillary sinus, as before.  IMPRESSION: Stable exam.  No acute intracranial abnormality.  Atrophy with chronic small vessel white matter ischemic demyelination.  Original Report Authenticated By: ERIC A. MANSELL, M.D.    Medications: I have reviewed the patient's current medications.   Physical exam GENERAL- alert HEAD- normal atraumatic, no neck masses, normal thyroid, no jvd RESPIRATORY- appears well, vitals normal, no respiratory distress, acyanotic, normal RR, ear and throat exam is normal, neck free of mass or lymphadenopathy, chest clear, no wheezing, crepitations, rhonchi, normal symmetric air entry CVS- regular rate and rhythm, S1, S2 normal, no murmur, click, rub or gallop ABDOMEN- abdomen is soft without significant tenderness, masses, organomegaly or guarding NEURO- Grossly normal EXTREMITIES- extremities normal, atraumatic, no cyanosis or edema  Plan   *UTI (lower urinary  tract infection)- seems recurrent. Clinically better on ceftriaxone. Follow urine culture. Seen by his urologist in [place](Dr [place]) who apparently did ?ctystoscopy and sent some urine cultures. Follow urine culture.  *htn/cad/dm2/hypothyroidism- seem stable. Continue home meds.  * home once urine culture available?    Lakera Viall 12/21/2011 10:16 PM Pager: [phone].

## 2011-12-22 LAB — COMPREHENSIVE METABOLIC PANEL
AST: 14 U/L (ref 0–37)
Albumin: 3.2 g/dL — ABNORMAL LOW (ref 3.5–5.2)
BUN: 16 mg/dL (ref 6–23)
Calcium: 8.7 mg/dL (ref 8.4–10.5)
Chloride: 101 mEq/L (ref 96–112)
Creatinine, Ser: 1.33 mg/dL (ref 0.50–1.35)
Total Bilirubin: 0.3 mg/dL (ref 0.3–1.2)

## 2011-12-22 LAB — GLUCOSE, CAPILLARY: Glucose-Capillary: 93 mg/dL (ref 70–99)

## 2011-12-22 LAB — CBC
HCT: 36.8 % — ABNORMAL LOW (ref 39.0–52.0)
MCH: 28.3 pg (ref 26.0–34.0)
MCHC: 32.9 g/dL (ref 30.0–36.0)
MCV: 86.2 fL (ref 78.0–100.0)
Platelets: 130 10*3/uL — ABNORMAL LOW (ref 150–400)
RDW: 13.4 % (ref 11.5–15.5)

## 2011-12-22 MED ORDER — ENOXAPARIN SODIUM 40 MG/0.4ML ~~LOC~~ SOLN
40.0000 mg | SUBCUTANEOUS | Status: DC
  Administered 2011-12-22: 40 mg via SUBCUTANEOUS
  Filled 2011-12-22 (×2): qty 0.4

## 2011-12-22 NOTE — Progress Notes (Signed)
PROGRESS NOTE  [name] ZOX:[id number] DOB: 22-Oct-1929 DOA: 12/20/2011 PCP: [name] Barre, MD, MD Urology: Dr. Criselda Peaches in [facility]  Brief narrative: 78 year old man presents the emergency department after he became nauseous when taking antibiotics for urinary tract infection. He complained of generalized weakness and dizziness. Per friend the patient was confused prior to admission as well; this cannot be confirmed. He was admitted for urinary tract infection, dizziness.  Past medical history: Left bundle-branch block, pacemaker?/AICD placement, ischemic cardiomyopathy, hypothyroidism, diabetes mellitus type 2, hyperlipidemia, delusional disorder, recurrent infarctions/coronary artery disease, chronic systolic congestive heart failure, BPH  Consultants:  None  Procedures:  None  Antibiotics:  January 22: Rocephin  Interim History: Chart reviewed in detail. Subjective: Feels better. No complaints offered.  Objective: Filed Vitals:   12/21/11 1300 12/21/11 2139 12/22/11 0541 12/22/11 1334  BP: 152/64 163/67 154/81 166/79  Pulse: 64 62 65 64  Temp: 98.2 F (36.8 C) 98.8 F (37.1 C) 98.9 F (37.2 C) 98.2 F (36.8 C)  TempSrc: Oral Oral Oral Oral  Resp: 18 18 18 18   Height:      Weight:      SpO2: 94% 92% 98% 98%    Intake/Output Summary (Last 24 hours) at 12/22/11 1456 Last data filed at 12/22/11 1300  Gross per 24 hour  Intake    960 ml  Output   2650 ml  Net  -1690 ml    Exam:  General: Appears calm and comfortable. Cardiovascular: Regular rate and rhythm. No murmur, rub or gallop. No lower extremity edema. Respiratory: Clear to auscultation bilaterally. No wheezes, rales or rhonchi. Normal respiratory effort.  Data Reviewed: Basic Metabolic Panel:  Lab 12/22/11 8119 12/21/11 0313 12/20/11 1330  NA 137 138 139  K 3.8 3.8 --  CL 101 103 106  CO2 30 27 28   GLUCOSE 153* 123* 99  BUN 16 13 15   CREATININE 1.33 1.15 1.18  CALCIUM 8.7 8.8 8.7  MG -- -- --    PHOS -- -- --   Liver Function Tests:  Lab 12/22/11 0347 12/20/11 1330  AST 14 18  ALT 11 13  ALKPHOS 54 58  BILITOT 0.3 0.3  PROT 6.6 7.0  ALBUMIN 3.2* 3.3*   CBC:  Lab 12/22/11 0347 12/21/11 0313 12/20/11 1330  WBC 4.1 5.3 4.6  NEUTROABS -- -- --  HGB 12.1* 12.8* 12.5*  HCT 36.8* 38.4* 37.6*  MCV 86.2 85.3 85.8  PLT 130* 134* 136*   Cardiac Enzymes:  Lab 12/20/11 1330  CKTOTAL 255*  CKMB 5.7*  CKMBINDEX --  TROPONINI <0.30   CBG:  Lab 12/22/11 0734 12/21/11 1837 12/21/11 0826 12/20/11 2239 12/20/11 1214  GLUCAP 93 161* 85 88 103*    Recent Results (from the past 240 hour(s))  URINE CULTURE     Status: Normal   Collection Time   12/20/11  2:29 PM      Component Value Range Status Comment   Specimen Description URINE, CLEAN CATCH   Final    Special Requests NONE   Final    Setup Time [id number]   Final    Colony Count 4,000 COLONIES/ML   Final    Culture INSIGNIFICANT GROWTH   Final    Report Status 12/21/2011 FINAL   Final   MRSA PCR SCREENING     Status: Normal   Collection Time   12/21/11 12:30 PM      Component Value Range Status Comment   MRSA by PCR NEGATIVE  NEGATIVE  Final  Studies: Dg Chest 2 View  12/20/2011  *RADIOLOGY REPORT*  Clinical Data: Weakness.  Mental status changes.  CHEST - 2 VIEW  Comparison: 06/27/2011.  Findings: AICD / biventricular pacer is in place.  Leads appear in a grossly similar position to that of the prior exam.  Cardiomegaly.  Pulmonary vascular congestion/mild pulmonary edema.  No gross pneumothorax or segmental consolidation.  Calcified tortuous aorta.  IMPRESSION: AICD / biventricular pacer is in place.  Leads appear in a grossly similar position to that of the prior exam.  Cardiomegaly.   Pulmonary vascular congestion/mild pulmonary edema.  Calcified tortuous aorta.  Original Report Authenticated By: Fuller Canada, M.D.   Ct Head Wo Contrast  12/20/2011  *RADIOLOGY REPORT*  Clinical Data: Altered mental status.   CT HEAD WITHOUT CONTRAST  Technique:  Contiguous axial images were obtained from the base of the skull through the vertex without contrast.  Comparison: 03/19/2011  Findings: There is no evidence for acute hemorrhage, hydrocephalus, mass lesion, or abnormal extra-axial fluid collection.  No definite CT evidence for acute infarction.  Diffuse loss of parenchymal volume is consistent with atrophy. Patchy low attenuation in the deep hemispheric and periventricular white matter is nonspecific, but likely reflects chronic microvascular ischemic demyelination.  Polypoid mucosal disease is seen in the right maxillary sinus, as before.  IMPRESSION: Stable exam.  No acute intracranial abnormality.  Atrophy with chronic small vessel white matter ischemic demyelination.  Original Report Authenticated By: ERIC A. MANSELL, M.D.    Scheduled Meds:   . aspirin EC  81 mg Oral Daily  . carvedilol  25 mg Oral BID WC  . cefTRIAXone (ROCEPHIN)  IV  1 g Intravenous Q24H  . clopidogrel  75 mg Oral Daily  . enoxaparin  40 mg Subcutaneous Q24H  . finasteride  5 mg Oral Daily  . furosemide  40 mg Oral BID  . insulin glargine  60 Units Subcutaneous QHS  . isosorbide dinitrate  30 mg Oral BID  . levothyroxine  150 mcg Oral Daily  . pantoprazole  40 mg Oral Q1200  . polyethylene glycol  17 g Oral Daily  . potassium chloride SA  20 mEq Oral BID  . rosuvastatin  20 mg Oral Daily  . DISCONTD: dutasteride  0.5 mg Oral Daily  . DISCONTD: enoxaparin  30 mg Subcutaneous Q24H  . DISCONTD: insulin glargine  60 Units Subcutaneous Daily  . DISCONTD: insulin glargine  90 Units Subcutaneous Daily   Continuous Infusions:    Assessment/Plan: 1. Altered mental status: Resolved. 2. UTI: Continue empiric ceftriaxone. Culture unrevealing. Of note patient was on antibiotics prior to admission. Followup with urology as an outpatient. 3. Thrombocytopenia: Chronic. Dating to at least February 2012. Followup as  outpatient. 4. Dizziness: Resolved. 5. Diabetes mellitus type 2: Stable. Continue current treatment. 6. Ischemic cardiomyopathy/chronic systolic congestive heart failure: Stable.   Family Communication: niece [name] is his health care power of attorney she lives in [place] Disposition Plan: Home January 24.   Brendia Sacks, MD  [facility] Pager [phone] 12/22/2011, 2:56 PM    LOS: 2 days

## 2011-12-22 NOTE — Plan of Care (Signed)
Preliminary urine Cx results called- insignificant growth. Merlyn Albert RN [phone]

## 2011-12-23 MED ORDER — INSULIN GLARGINE 100 UNIT/ML ~~LOC~~ SOLN: 60.0000 [IU] | Freq: Every day | SUBCUTANEOUS | Status: DC

## 2011-12-23 MED ORDER — CIPROFLOXACIN HCL 250 MG PO TABS: 250.0000 mg | ORAL_TABLET | Freq: Two times a day (BID) | ORAL | Status: DC

## 2011-12-23 NOTE — Discharge Summary (Signed)
Physician Discharge Summary  RADFORD PEASE ZOX:[id number] DOB: [birth year] DOA: 12/20/2011  PCP: Oliver Barre, MD, MD  Admit date: 12/20/2011 Discharge date: 12/23/2011  Discharge Diagnoses:  1. Possible altered mental status, resolved 2. Urinary tract infection 3. Stable thrombocytopenia 4. Dizziness, resolved 5. Diabetes mellitus type 2, stable  Discharge Condition: Improved  Disposition: Home  History of present illness:  78 year old man presented to the emergency department after he became nauseous when taking antibiotics for urinary tract infection. He complained of generalized weakness and dizziness. Per friend the patient was confused prior to admission as well; this cannot be confirmed. He was admitted for urinary tract infection, dizziness.  Hospital Course:  Mr. Katen was admitted medical floor. He was treated empirically with Rocephin for urinary tract infection. Of note his culture was unrevealing however he had been on antibiotics in the outpatient setting (no prescription he had at home, unclear what the medication was). He had no evidence of altered mental status on this admission to his other issues have remained stable. Dizziness resolved. Complete a course of Cipro in the outpatient setting. He should followup with his primary care physician in one week. At that time consideration could be given to resuming his nitrofurantoin for suppressive therapy. 1. Altered mental status: Resolved.  2. UTI: Continue empiric ceftriaxone. Culture unrevealing. Of note patient was on antibiotics prior to admission. Followup with urology as an outpatient. Consider resuming nitrofurantoin after urinary tract infection treated.  3. Thrombocytopenia: Chronic. Dating to at least February 2012. Followup as outpatient.  4. Dizziness: Resolved.  5. Diabetes mellitus type 2: Stable. Continue current treatment.  6. Ischemic cardiomyopathy/chronic systolic congestive heart failure:  Stable. Consultants:  None  Procedures:  None  Antibiotics:  January 22: Rocephin   Discharge Instructions  Discharge Orders    Future Appointments: Provider: Department: Dept Phone: Center:   04/05/2012 1:30 PM Oliver Barre, MD [facility] [phone] [facility]     Future Orders Please Complete By Expires   Diet - low sodium heart healthy      Increase activity slowly        Medication List  As of 12/23/2011  1:59 PM   STOP taking these medications         dutasteride 0.5 MG capsule      nitrofurantoin 50 MG capsule         TAKE these medications         aspirin 81 MG EC tablet   Take 1 tablet (81 mg total) by mouth daily.      AVAPRO 300 MG tablet   Generic drug: irbesartan   TAKE ONE TABLET BY MOUTH EVERY DAY      carvedilol 25 MG tablet   Commonly known as: COREG   TAKE ONE TABLET BY MOUTH TWICE DAILY      ciprofloxacin 250 MG tablet   Commonly known as: CIPRO   Take 1 tablet (250 mg total) by mouth 2 (two) times daily.      clopidogrel 75 MG tablet   Commonly known as: PLAVIX   Take 75 mg by mouth daily.      finasteride 5 MG tablet   Commonly known as: PROSCAR   Take 1 tablet (5 mg total) by mouth daily.      furosemide 40 MG tablet   Commonly known as: LASIX   Take 2 tablets in the morning and 1 tablet in the evening      glucose blood test strip   To check blood sugar  once per day      HYDROcodone-acetaminophen 5-325 MG per tablet   Commonly known as: NORCO   Take 1 tablet by mouth every 6 (six) hours as needed for pain.      insulin glargine 100 UNIT/ML injection   Commonly known as: LANTUS   Inject 60 Units into the skin daily.      isosorbide dinitrate 30 MG tablet   Commonly known as: ISORDIL   TAKE ONE TABLET BY MOUTH TWICE DAILY      levothyroxine 150 MCG tablet   Commonly known as: SYNTHROID, LEVOTHROID   Take 150 mcg by mouth daily.      nitroGLYCERIN 0.3 MG SL tablet   Commonly known as: NITROSTAT   Place 1 tablet (0.3 mg  total) under the tongue every 5 (five) minutes as needed for chest pain.      ondansetron 4 MG tablet   Commonly known as: ZOFRAN   Take 1 tablet (4 mg total) by mouth every 6 (six) hours.      onetouch ultrasoft lancets   To check sugar once per day      potassium chloride SA 20 MEQ tablet   Commonly known as: K-DUR,KLOR-CON   Take 2 tablets in the morning and 1 tablet in the evening      PRILOSEC 20 MG capsule   Generic drug: omeprazole   Take 20 mg by mouth daily.      simvastatin 80 MG tablet   Commonly known as: ZOCOR   TAKE ONE TABLET BY MOUTH AT BEDTIME           Follow-up Information    Follow up with Oliver Barre, MD in 1 week.   Contact information:   [address] [facility] [address] [place] [address] [place] [address] [phone]       Follow up with [facility]PT. (As needed if symptoms worsen)    Contact information:   [address] [place] [address] [phone]          The results of significant diagnostics from this hospitalization (including imaging, microbiology, ancillary and laboratory) are listed below for reference.    Significant Diagnostic Studies: Dg Chest 2 View  12/20/2011  *RADIOLOGY REPORT*  Clinical Data: Weakness.  Mental status changes.  CHEST - 2 VIEW  Comparison: 06/27/2011.  Findings: AICD / biventricular pacer is in place.  Leads appear in a grossly similar position to that of the prior exam.  Cardiomegaly.  Pulmonary vascular congestion/mild pulmonary edema.  No gross pneumothorax or segmental consolidation.  Calcified tortuous aorta.  IMPRESSION: AICD / biventricular pacer is in place.  Leads appear in a grossly similar position to that of the prior exam.  Cardiomegaly.   Pulmonary vascular congestion/mild pulmonary edema.  Calcified tortuous aorta.  Original Report Authenticated By: Fuller Canada, M.D.   Ct Head Wo Contrast  12/20/2011  *RADIOLOGY REPORT*  Clinical Data: Altered mental status.   CT HEAD WITHOUT CONTRAST  Technique:  Contiguous axial images were obtained from the base of the skull through the vertex without contrast.  Comparison: 03/19/2011  Findings: There is no evidence for acute hemorrhage, hydrocephalus, mass lesion, or abnormal extra-axial fluid collection.  No definite CT evidence for acute infarction.  Diffuse loss of parenchymal volume is consistent with atrophy. Patchy low attenuation in the deep hemispheric and periventricular white matter is nonspecific, but likely reflects chronic microvascular ischemic demyelination.  Polypoid mucosal disease is seen in the right maxillary  sinus, as before.  IMPRESSION: Stable exam.  No acute intracranial abnormality.  Atrophy with chronic small vessel white matter ischemic demyelination.  Original Report Authenticated By: ERIC A. MANSELL, M.D.    Microbiology: Recent Results (from the past 240 hour(s))  URINE CULTURE     Status: Normal   Collection Time   12/20/11  2:29 PM      Component Value Range Status Comment   Specimen Description URINE, CLEAN CATCH   Final    Special Requests NONE   Final    Setup Time [id number]   Final    Colony Count 4,000 COLONIES/ML   Final    Culture INSIGNIFICANT GROWTH   Final    Report Status 12/21/2011 FINAL   Final   MRSA PCR SCREENING     Status: Normal   Collection Time   12/21/11 12:30 PM      Component Value Range Status Comment   MRSA by PCR NEGATIVE  NEGATIVE  Final      Labs: Basic Metabolic Panel:  Lab 12/22/11 2952 12/21/11 0313 12/20/11 1330  NA 137 138 139  K 3.8 3.8 --  CL 101 103 106  CO2 30 27 28   GLUCOSE 153* 123* 99  BUN 16 13 15   CREATININE 1.33 1.15 1.18  CALCIUM 8.7 8.8 8.7  MG -- -- --  PHOS -- -- --   Liver Function Tests:  Lab 12/22/11 0347 12/20/11 1330  AST 14 18  ALT 11 13  ALKPHOS 54 58  BILITOT 0.3 0.3  PROT 6.6 7.0  ALBUMIN 3.2* 3.3*   CBC:  Lab 12/22/11 0347 12/21/11 0313 12/20/11 1330  WBC 4.1 5.3 4.6  NEUTROABS -- -- --  HGB  12.1* 12.8* 12.5*  HCT 36.8* 38.4* 37.6*  MCV 86.2 85.3 85.8  PLT 130* 134* 136*   Cardiac Enzymes:  Lab 12/20/11 1330  CKTOTAL 255*  CKMB 5.7*  CKMBINDEX --  TROPONINI <0.30   CBG:  Lab 12/22/11 0734 12/21/11 1837 12/21/11 0826 12/20/11 2239 12/20/11 1214  GLUCAP 93 161* 85 88 103*    Time coordinating discharge:  35 minutes.   Signed:  Brendia Sacks, MD  [facility] 12/23/2011, 1:59 PM

## 2011-12-23 NOTE — Progress Notes (Signed)
PROGRESS NOTE  [name] ZOX:[id number] DOB: [birth year] DOA: 12/20/2011 PCP: Oliver Barre, MD, MD Urology: Dr. Criselda Peaches in [facility]  Brief narrative: 78 year old man presents the emergency department after he became nauseous when taking antibiotics for urinary tract infection. He complained of generalized weakness and dizziness. Per friend the patient was confused prior to admission as well; this cannot be confirmed. He was admitted for urinary tract infection, dizziness.  Past medical history: Left bundle-branch block, pacemaker/AICD placement, ischemic cardiomyopathy, hypothyroidism, diabetes mellitus type 2, hyperlipidemia, delusional disorder, myocardial infarctions/coronary artery disease, chronic systolic congestive heart failure, BPH  Consultants:  None  Procedures:  None  Antibiotics:  January 22: Rocephin  Interim History: Chart reviewed. Subjective: Feels better. No dizziness.  Objective: Filed Vitals:   12/22/11 0541 12/22/11 1334 12/22/11 2139 12/23/11 0528  BP: 154/81 166/79 145/82 150/68  Pulse: 65 64 60 70  Temp: 98.9 F (37.2 C) 98.2 F (36.8 C) 98.4 F (36.9 C) 98.3 F (36.8 C)  TempSrc: Oral Oral Oral Oral  Resp: 18 18 20 20   Height:      Weight:      SpO2: 98% 98% 97% 92%    Intake/Output Summary (Last 24 hours) at 12/23/11 1250 Last data filed at 12/23/11 0900  Gross per 24 hour  Intake    840 ml  Output   1725 ml  Net   -885 ml    Exam:  General: Appears calm and comfortable. Cardiovascular: Regular rate and rhythm. No murmur, rub or gallop. No lower extremity edema. Respiratory: Clear to auscultation bilaterally. No wheezes, rales or rhonchi. Normal respiratory effort.  Data Reviewed: Basic Metabolic Panel:  Lab 12/22/11 8119 12/21/11 0313 12/20/11 1330  NA 137 138 139  K 3.8 3.8 --  CL 101 103 106  CO2 30 27 28   GLUCOSE 153* 123* 99  BUN 16 13 15   CREATININE 1.33 1.15 1.18  CALCIUM 8.7 8.8 8.7  MG -- -- --  PHOS -- -- --    Liver Function Tests:  Lab 12/22/11 0347 12/20/11 1330  AST 14 18  ALT 11 13  ALKPHOS 54 58  BILITOT 0.3 0.3  PROT 6.6 7.0  ALBUMIN 3.2* 3.3*   CBC:  Lab 12/22/11 0347 12/21/11 0313 12/20/11 1330  WBC 4.1 5.3 4.6  NEUTROABS -- -- --  HGB 12.1* 12.8* 12.5*  HCT 36.8* 38.4* 37.6*  MCV 86.2 85.3 85.8  PLT 130* 134* 136*   Cardiac Enzymes:  Lab 12/20/11 1330  CKTOTAL 255*  CKMB 5.7*  CKMBINDEX --  TROPONINI <0.30   CBG:  Lab 12/22/11 0734 12/21/11 1837 12/21/11 0826 12/20/11 2239 12/20/11 1214  GLUCAP 93 161* 85 88 103*    Recent Results (from the past 240 hour(s))  URINE CULTURE     Status: Normal   Collection Time   12/20/11  2:29 PM      Component Value Range Status Comment   Specimen Description URINE, CLEAN CATCH   Final    Special Requests NONE   Final    Setup Time [id number]   Final    Colony Count 4,000 COLONIES/ML   Final    Culture INSIGNIFICANT GROWTH   Final    Report Status 12/21/2011 FINAL   Final   MRSA PCR SCREENING     Status: Normal   Collection Time   12/21/11 12:30 PM      Component Value Range Status Comment   MRSA by PCR NEGATIVE  NEGATIVE  Final  Studies: Dg Chest 2 View  12/20/2011  *RADIOLOGY REPORT*  Clinical Data: Weakness.  Mental status changes.  CHEST - 2 VIEW  Comparison: 06/27/2011.  Findings: AICD / biventricular pacer is in place.  Leads appear in a grossly similar position to that of the prior exam.  Cardiomegaly.  Pulmonary vascular congestion/mild pulmonary edema.  No gross pneumothorax or segmental consolidation.  Calcified tortuous aorta.  IMPRESSION: AICD / biventricular pacer is in place.  Leads appear in a grossly similar position to that of the prior exam.  Cardiomegaly.   Pulmonary vascular congestion/mild pulmonary edema.  Calcified tortuous aorta.  Original Report Authenticated By: Fuller Canada, M.D.   Ct Head Wo Contrast  12/20/2011  *RADIOLOGY REPORT*  Clinical Data: Altered mental status.  CT HEAD  WITHOUT CONTRAST  Technique:  Contiguous axial images were obtained from the base of the skull through the vertex without contrast.  Comparison: 03/19/2011  Findings: There is no evidence for acute hemorrhage, hydrocephalus, mass lesion, or abnormal extra-axial fluid collection.  No definite CT evidence for acute infarction.  Diffuse loss of parenchymal volume is consistent with atrophy. Patchy low attenuation in the deep hemispheric and periventricular white matter is nonspecific, but likely reflects chronic microvascular ischemic demyelination.  Polypoid mucosal disease is seen in the right maxillary sinus, as before.  IMPRESSION: Stable exam.  No acute intracranial abnormality.  Atrophy with chronic small vessel white matter ischemic demyelination.  Original Report Authenticated By: ERIC A. MANSELL, M.D.    Scheduled Meds:    . aspirin EC  81 mg Oral Daily  . carvedilol  25 mg Oral BID WC  . cefTRIAXone (ROCEPHIN)  IV  1 g Intravenous Q24H  . clopidogrel  75 mg Oral Daily  . enoxaparin  40 mg Subcutaneous Q24H  . finasteride  5 mg Oral Daily  . furosemide  40 mg Oral BID  . insulin glargine  60 Units Subcutaneous QHS  . isosorbide dinitrate  30 mg Oral BID  . levothyroxine  150 mcg Oral Daily  . pantoprazole  40 mg Oral Q1200  . polyethylene glycol  17 g Oral Daily  . potassium chloride SA  20 mEq Oral BID  . rosuvastatin  20 mg Oral Daily   Continuous Infusions:    Assessment/Plan: 1. Altered mental status: Resolved. 2. UTI: Continue empiric ceftriaxone. Culture unrevealing. Of note patient was on antibiotics prior to admission. Followup with urology as an outpatient. 3. Thrombocytopenia: Chronic. Dating to at least February 2012. Followup as outpatient. 4. Dizziness: Resolved. 5. Diabetes mellitus type 2: Stable. Continue current treatment. 6. Ischemic cardiomyopathy/chronic systolic congestive heart failure: Stable.   Family Communication: niece [name] is his health  care power of attorney she lives in [place] Disposition Plan: Home today.   Brendia Sacks, MD  [facility] Pager [phone] 12/23/2011, 12:50 PM    LOS: 3 days

## 2011-12-28 ENCOUNTER — Ambulatory Visit (INDEPENDENT_AMBULATORY_CARE_PROVIDER_SITE_OTHER): Payer: Medicare Other | Admitting: Internal Medicine

## 2011-12-28 ENCOUNTER — Encounter: Payer: Self-pay | Admitting: Internal Medicine

## 2011-12-28 VITALS — BP 102/62 | HR 55 | Temp 97.8°F | Ht 74.0 in | Wt 285.0 lb

## 2011-12-28 DIAGNOSIS — I5022 Chronic systolic (congestive) heart failure: Secondary | ICD-10-CM

## 2011-12-28 DIAGNOSIS — I10 Essential (primary) hypertension: Secondary | ICD-10-CM | POA: Insufficient documentation

## 2011-12-28 DIAGNOSIS — N390 Urinary tract infection, site not specified: Secondary | ICD-10-CM

## 2011-12-28 DIAGNOSIS — E119 Type 2 diabetes mellitus without complications: Secondary | ICD-10-CM

## 2011-12-28 MED ORDER — IRBESARTAN 300 MG PO TABS: 300.0000 mg | ORAL_TABLET | Freq: Every day | ORAL | Status: DC

## 2011-12-28 MED ORDER — FUROSEMIDE 40 MG PO TABS: ORAL_TABLET | ORAL | Status: DC

## 2011-12-28 MED ORDER — ONETOUCH ULTRASOFT LANCETS MISC: Status: DC

## 2011-12-28 MED ORDER — ISOSORBIDE DINITRATE 30 MG PO TABS: 30.0000 mg | ORAL_TABLET | Freq: Two times a day (BID) | ORAL | Status: DC

## 2011-12-28 MED ORDER — INSULIN GLARGINE 100 UNIT/ML ~~LOC~~ SOLN: 60.0000 [IU] | Freq: Every day | SUBCUTANEOUS | Status: DC

## 2011-12-28 MED ORDER — SIMVASTATIN 80 MG PO TABS: 80.0000 mg | ORAL_TABLET | Freq: Every day | ORAL | Status: DC

## 2011-12-28 MED ORDER — OMEPRAZOLE 20 MG PO CPDR: 20.0000 mg | DELAYED_RELEASE_CAPSULE | Freq: Every day | ORAL | Status: DC

## 2011-12-28 MED ORDER — FINASTERIDE 5 MG PO TABS: 5.0000 mg | ORAL_TABLET | Freq: Every day | ORAL | Status: DC

## 2011-12-28 MED ORDER — CARVEDILOL 25 MG PO TABS: 25.0000 mg | ORAL_TABLET | Freq: Two times a day (BID) | ORAL | Status: DC

## 2011-12-28 MED ORDER — GLUCOSE BLOOD VI STRP: ORAL_STRIP | Status: DC

## 2011-12-28 MED ORDER — CLOPIDOGREL BISULFATE 75 MG PO TABS: 75.0000 mg | ORAL_TABLET | Freq: Every day | ORAL | Status: DC

## 2011-12-28 MED ORDER — LEVOTHYROXINE SODIUM 150 MCG PO TABS: 150.0000 ug | ORAL_TABLET | Freq: Every day | ORAL | Status: DC

## 2011-12-28 MED ORDER — POTASSIUM CHLORIDE CRYS ER 20 MEQ PO TBCR: EXTENDED_RELEASE_TABLET | ORAL | Status: DC
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
## 2011-12-28 NOTE — Assessment & Plan Note (Signed)
stable overall by hx and exam, most recent data reviewed with pt, and pt to continue medical treatment as before  BP Readings from Last 3 Encounters:  12/28/11 102/62  12/23/11 161/76  10/06/11 110/68

## 2011-12-28 NOTE — Assessment & Plan Note (Signed)
stable overall by hx and exam, most recent data reviewed with pt, and pt to continue medical treatment as before Lab Results  Component Value Date   WBC 4.1 12/22/2011   HGB 12.1* 12/22/2011   HCT 36.8* 12/22/2011   PLT 130* 12/22/2011   GLUCOSE 153* 12/22/2011   CHOL 122 10/06/2011   TRIG 117.0 10/06/2011   HDL 37.10* 10/06/2011   LDLCALC 62 10/06/2011   ALT 11 12/22/2011   AST 14 12/22/2011   NA 137 12/22/2011   K 3.8 12/22/2011   CL 101 12/22/2011   CREATININE 1.33 12/22/2011   BUN 16 12/22/2011   CO2 30 12/22/2011   TSH 19.19* 10/06/2011   PSA 7.64* 10/06/2011   INR 1.0 01/01/2011   HGBA1C 10.1* 10/06/2011   MICROALBUR 5.6* 10/06/2011

## 2011-12-28 NOTE — Assessment & Plan Note (Signed)
Clinically resolved,  to f/u any worsening symptoms or concerns 

## 2011-12-28 NOTE — Patient Instructions (Addendum)
There are no medication changes at this time, and no need for blood work today All of your prescriptions were refilled to your pharmacy

## 2011-12-28 NOTE — Assessment & Plan Note (Signed)
stable overall by hx and exam, most recent data reviewed with pt, and pt to continue medical treatment as before' Lab Results  Component Value Date   HGBA1C 10.1* 10/06/2011

## 2012-02-07 IMAGING — CR DG CHEST 2V
2 series · 2 of 2 positions shown · non-contrast
Comparison: 01/23/2010

CLINICAL DATA: Short of breath.  Left foot osteomyelitis.
Diabetes.

CHEST - 2 VIEW

[w chest pa]
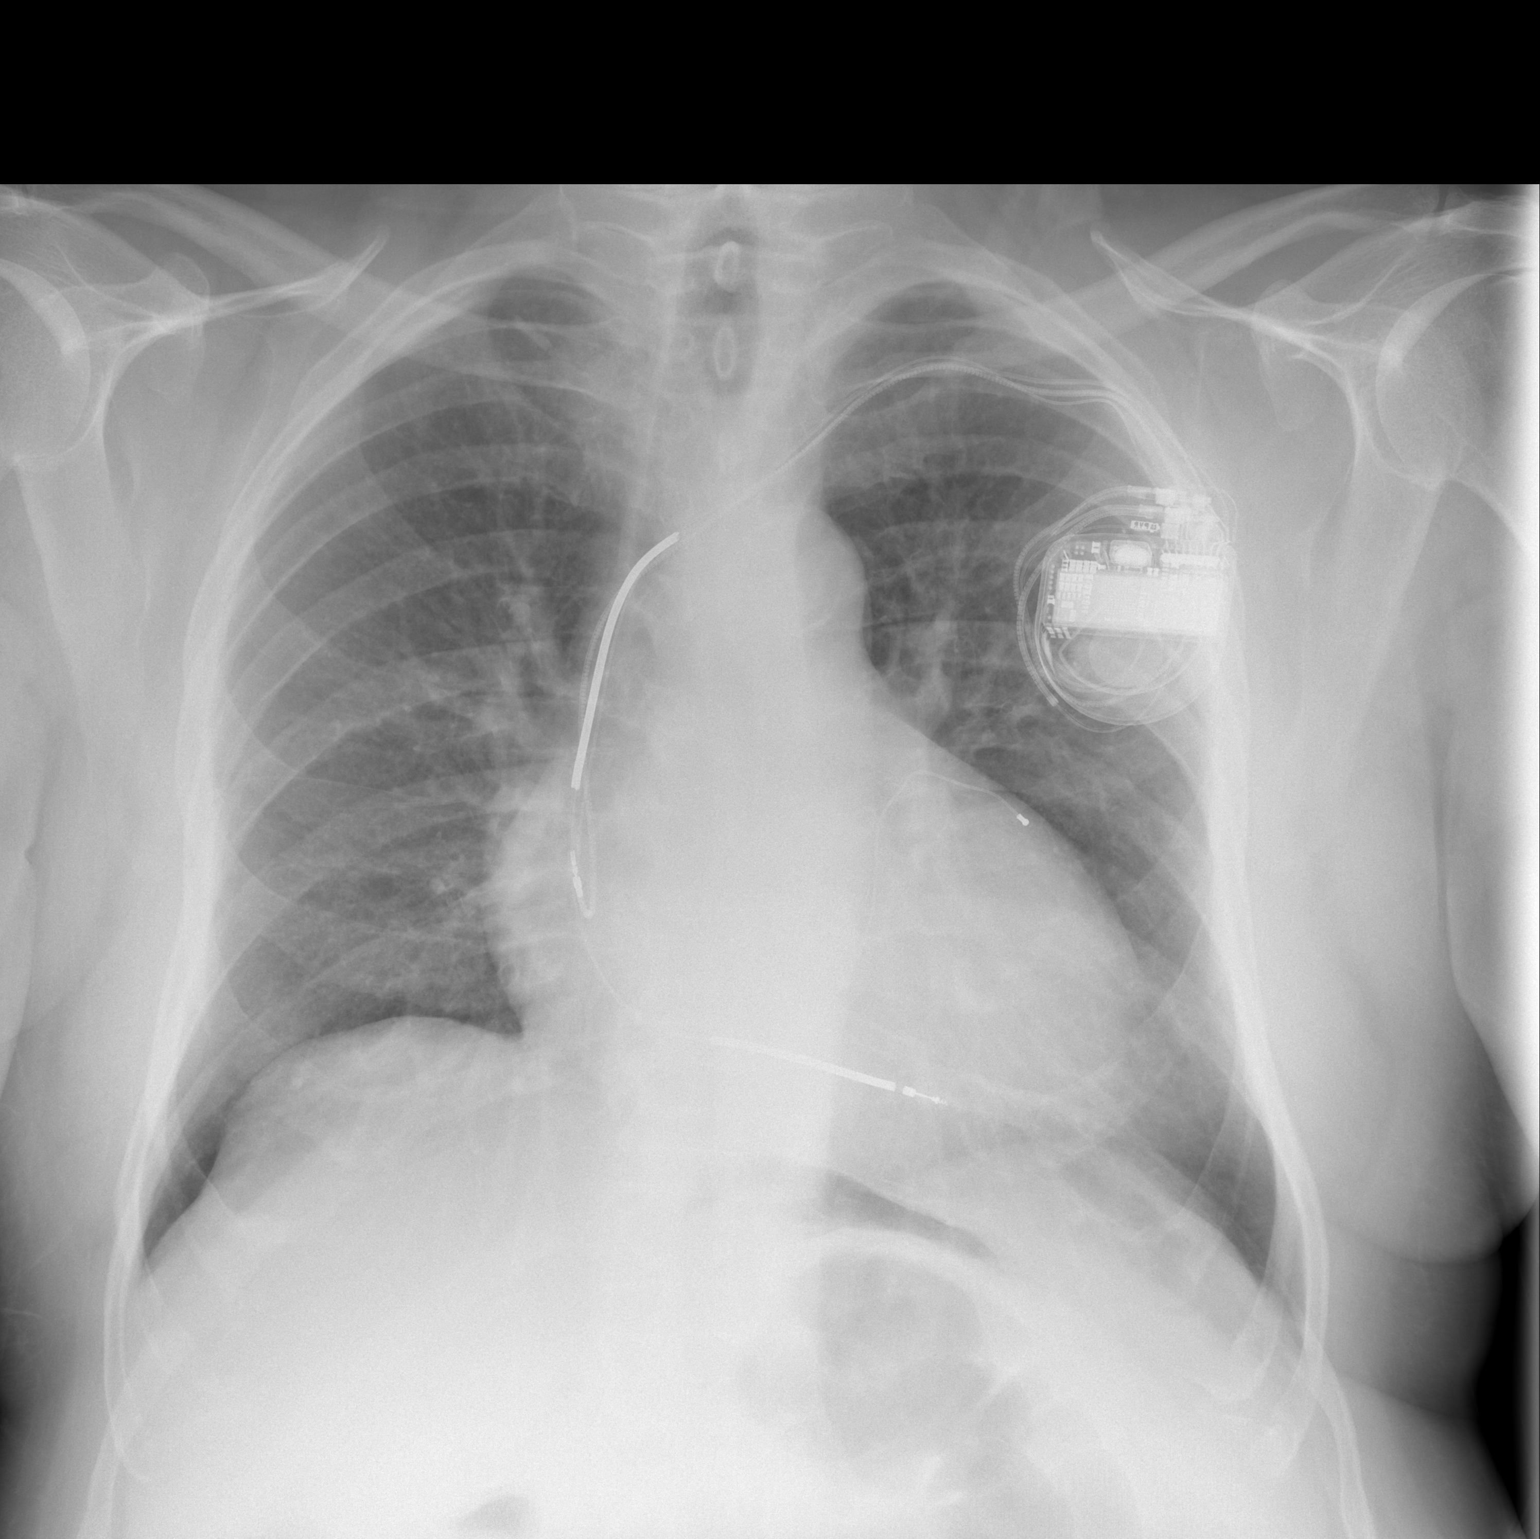

[w chest lat]
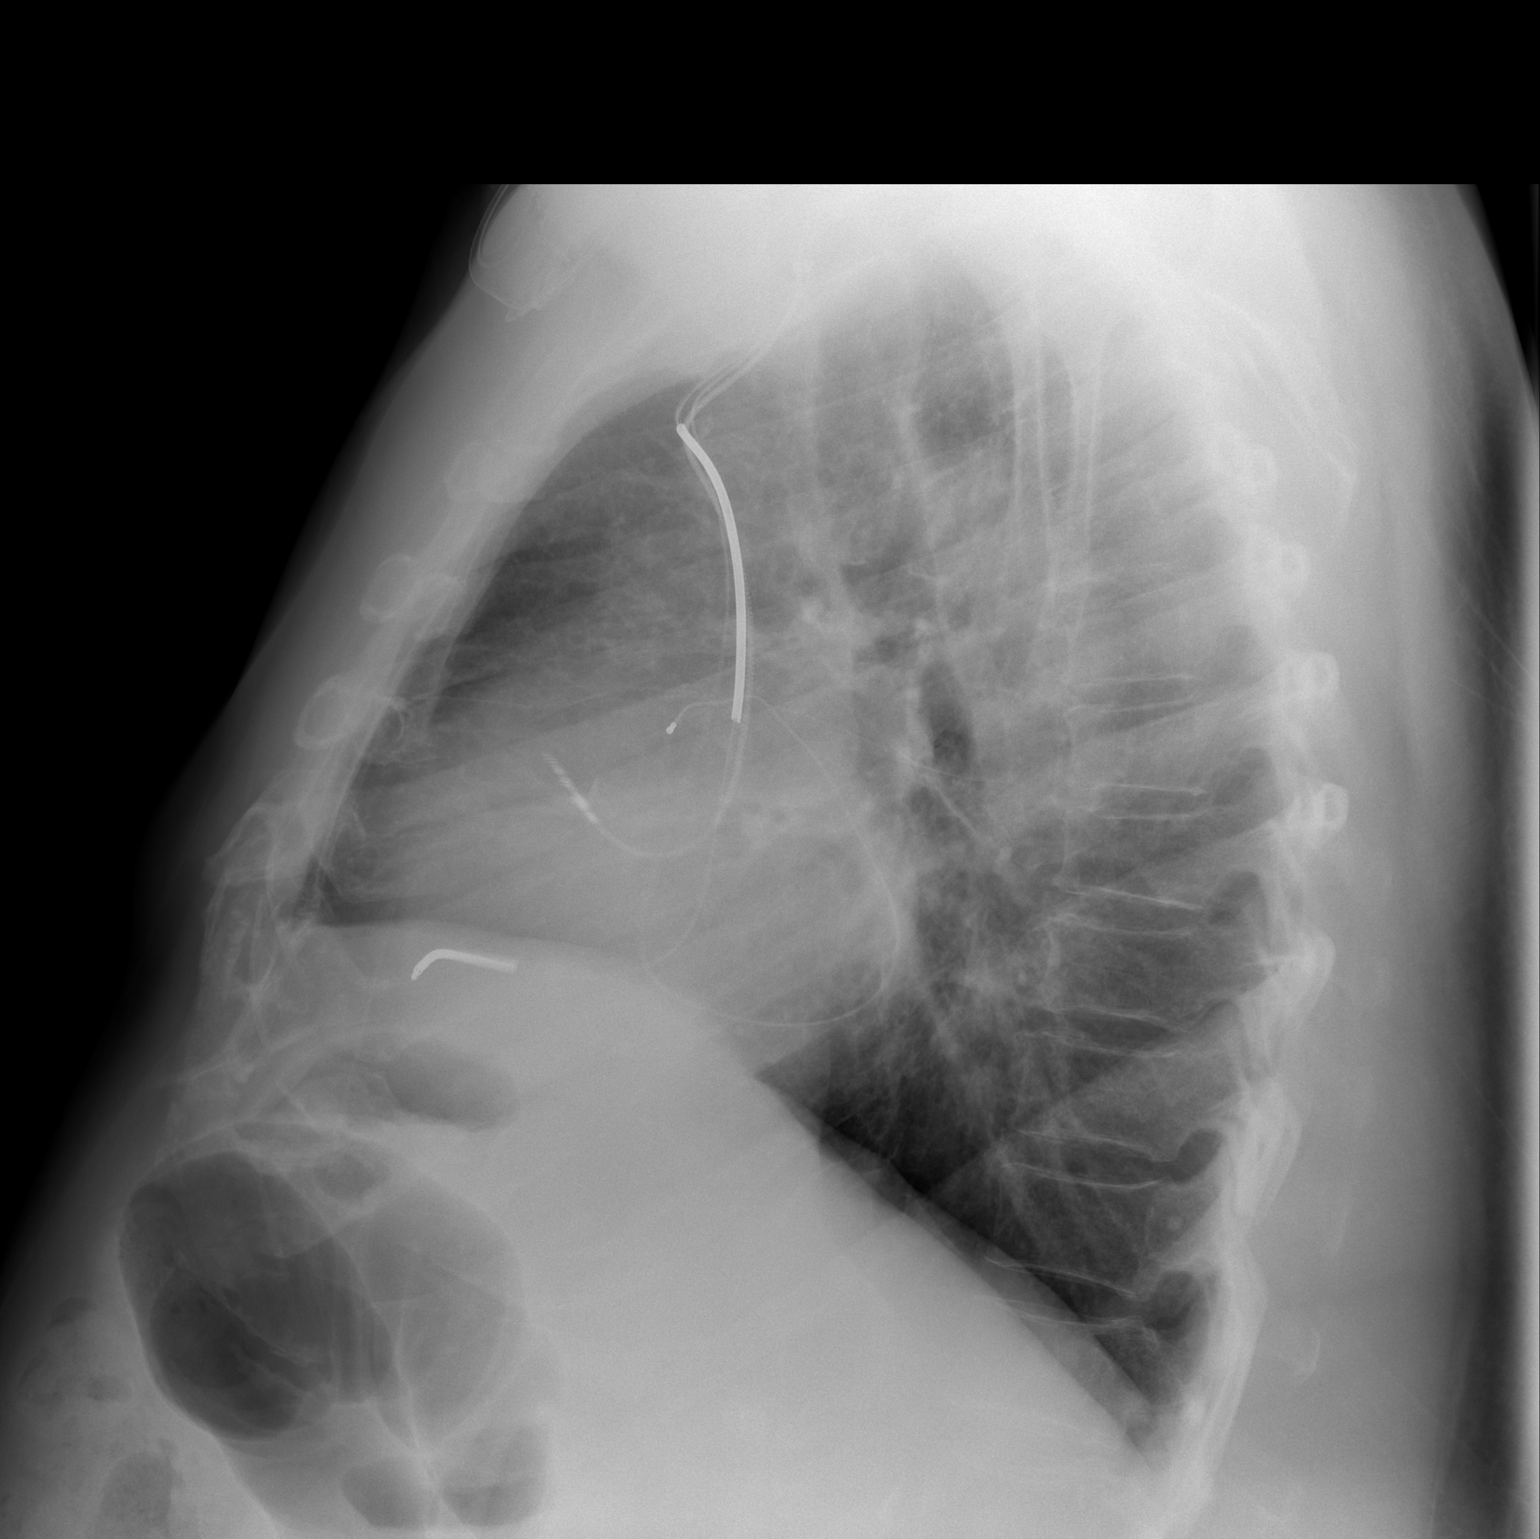

[2 of 2 positions shown; findings below may reference images not displayed]

FINDINGS: Mild to moderate cardiomegaly stable.  AICD remains in
appropriate position.  Both lungs are clear.  No evidence of
pleural effusion.  No mass or adenopathy identified.
IMPRESSION: Stable cardiomegaly.  No active disease.

## 2012-02-22 ENCOUNTER — Telehealth: Payer: Self-pay

## 2012-02-22 MED ORDER — TRAMADOL HCL 50 MG PO TABS: 50.0000 mg | ORAL_TABLET | Freq: Four times a day (QID) | ORAL | Status: AC | PRN

## 2012-02-22 NOTE — Telephone Encounter (Signed)
Pt has known chronic knee and leg pain , for tramadol prn trial, let us know if this works and he needs refill

## 2012-02-22 NOTE — Telephone Encounter (Signed)
Patient informed of prescription requested sent to his pharmacy.

## 2012-02-22 NOTE — Telephone Encounter (Signed)
The patient called concerning needing some pain medication for his leg. The patient stated he is in a lot of pain. Please advise call back number for the patient [phone]

## 2012-04-03 ENCOUNTER — Other Ambulatory Visit: Payer: Self-pay | Admitting: Internal Medicine

## 2012-04-05 ENCOUNTER — Ambulatory Visit: Payer: Medicare Other | Admitting: Internal Medicine

## 2012-04-05 DIAGNOSIS — Z0289 Encounter for other administrative examinations: Secondary | ICD-10-CM

## 2012-04-16 ENCOUNTER — Emergency Department (HOSPITAL_COMMUNITY): Payer: Medicare Other

## 2012-04-16 ENCOUNTER — Encounter (HOSPITAL_COMMUNITY): Payer: Self-pay

## 2012-04-16 ENCOUNTER — Emergency Department (HOSPITAL_COMMUNITY)
Admission: EM | Admit: 2012-04-16 | Discharge: 2012-04-16 | Disposition: A | Discharge status: Home Health | Payer: Medicare Other | Source: Home / Self Care | Attending: Emergency Medicine | Admitting: Emergency Medicine

## 2012-04-16 DIAGNOSIS — I5022 Chronic systolic (congestive) heart failure: Secondary | ICD-10-CM | POA: Insufficient documentation

## 2012-04-16 DIAGNOSIS — I10 Essential (primary) hypertension: Secondary | ICD-10-CM | POA: Insufficient documentation

## 2012-04-16 DIAGNOSIS — Z79899 Other long term (current) drug therapy: Secondary | ICD-10-CM | POA: Insufficient documentation

## 2012-04-16 DIAGNOSIS — N390 Urinary tract infection, site not specified: Secondary | ICD-10-CM

## 2012-04-16 DIAGNOSIS — E119 Type 2 diabetes mellitus without complications: Secondary | ICD-10-CM | POA: Insufficient documentation

## 2012-04-16 DIAGNOSIS — R0602 Shortness of breath: Secondary | ICD-10-CM | POA: Insufficient documentation

## 2012-04-16 DIAGNOSIS — Z8546 Personal history of malignant neoplasm of prostate: Secondary | ICD-10-CM | POA: Insufficient documentation

## 2012-04-16 DIAGNOSIS — I2510 Atherosclerotic heart disease of native coronary artery without angina pectoris: Secondary | ICD-10-CM | POA: Insufficient documentation

## 2012-04-16 DIAGNOSIS — I739 Peripheral vascular disease, unspecified: Secondary | ICD-10-CM | POA: Insufficient documentation

## 2012-04-16 DIAGNOSIS — R05 Cough: Secondary | ICD-10-CM | POA: Insufficient documentation

## 2012-04-16 DIAGNOSIS — E785 Hyperlipidemia, unspecified: Secondary | ICD-10-CM | POA: Insufficient documentation

## 2012-04-16 DIAGNOSIS — R079 Chest pain, unspecified: Secondary | ICD-10-CM | POA: Insufficient documentation

## 2012-04-16 DIAGNOSIS — E039 Hypothyroidism, unspecified: Secondary | ICD-10-CM | POA: Insufficient documentation

## 2012-04-16 DIAGNOSIS — I252 Old myocardial infarction: Secondary | ICD-10-CM | POA: Insufficient documentation

## 2012-04-16 DIAGNOSIS — Z794 Long term (current) use of insulin: Secondary | ICD-10-CM | POA: Insufficient documentation

## 2012-04-16 DIAGNOSIS — S88119A Complete traumatic amputation at level between knee and ankle, unspecified lower leg, initial encounter: Secondary | ICD-10-CM | POA: Insufficient documentation

## 2012-04-16 DIAGNOSIS — K219 Gastro-esophageal reflux disease without esophagitis: Secondary | ICD-10-CM | POA: Insufficient documentation

## 2012-04-16 DIAGNOSIS — R059 Cough, unspecified: Secondary | ICD-10-CM | POA: Insufficient documentation

## 2012-04-16 LAB — DIFFERENTIAL
Basophils Absolute: 0 10*3/uL (ref 0.0–0.1)
Lymphocytes Relative: 14 % (ref 12–46)
Monocytes Absolute: 0.6 10*3/uL (ref 0.1–1.0)
Neutro Abs: 3.7 10*3/uL (ref 1.7–7.7)

## 2012-04-16 LAB — URINALYSIS, ROUTINE W REFLEX MICROSCOPIC
Nitrite: NEGATIVE
Specific Gravity, Urine: 1.018 (ref 1.005–1.030)
pH: 7 (ref 5.0–8.0)

## 2012-04-16 LAB — CBC
HCT: 40.2 % (ref 39.0–52.0)
Platelets: 118 10*3/uL — ABNORMAL LOW (ref 150–400)
RBC: 4.72 MIL/uL (ref 4.22–5.81)
RDW: 13.6 % (ref 11.5–15.5)
WBC: 5.1 10*3/uL (ref 4.0–10.5)

## 2012-04-16 LAB — BASIC METABOLIC PANEL
Chloride: 100 mEq/L (ref 96–112)
GFR calc Af Amer: 70 mL/min — ABNORMAL LOW (ref >90)
Potassium: 3.1 mEq/L — ABNORMAL LOW (ref 3.5–5.1)

## 2012-04-16 LAB — URINE MICROSCOPIC-ADD ON

## 2012-04-16 LAB — TROPONIN I: Troponin I: <0.3 ng/mL (ref <0.30)

## 2012-04-16 MED ORDER — IOHEXOL 350 MG/ML SOLN
100.0000 mL | Freq: Once | INTRAVENOUS | Status: AC | PRN
  Administered 2012-04-16: 100 mL via INTRAVENOUS

## 2012-04-16 MED ORDER — CEPHALEXIN 500 MG PO CAPS: 500.0000 mg | ORAL_CAPSULE | Freq: Four times a day (QID) | ORAL | Status: DC

## 2012-04-16 MED ORDER — SODIUM CHLORIDE 0.9 % IV SOLN: INTRAVENOUS | Status: DC

## 2012-04-16 MED ORDER — IOHEXOL 350 MG/ML SOLN: 100.0000 mL | Freq: Once | INTRAVENOUS | Status: DC | PRN

## 2012-04-16 NOTE — Discharge Instructions (Signed)
Shortness of Breath Shortness of breath (dyspnea) is the feeling of uneasy breathing. Shortness of breath does not always mean that there is a life-threatening illness. However, shortness of breath requires immediate medical care. CAUSES  Causes for shortness of breath include:  Not enough oxygen in the air (as with high altitudes or with a smoke-filled room).   Short-term (acute) lung disease, including:   Infections such as pneumonia.   Fluid in the lungs, such as heart failure.   A blood clot in the lungs (pulmonary embolism).   Lasting (chronic) lung diseases.   Heart disease (heart attack, angina, heart failure, and others).   Low red blood cells (anemia).   Poor physical fitness. This can cause shortness of breath when you exercise.   Chest or back injuries or stiffness.   Being overweight (obese).   Anxiety. This can make you feel like you are not getting enough air.  DIAGNOSIS  Serious medical problems can usually be found during your physical exam. Many tests may also be done to determine why you are having shortness of breath. Tests include:  Chest X-rays.   Lung function tests.   Blood tests.   Electrocardiography.   Exercise testing.   A cardiac echo.   Imaging scans.  Your caregiver may not be able to find a cause for your shortness of breath after your exam. In this case, it is important to have a follow-up exam with your caregiver as directed.  HOME CARE INSTRUCTIONS   Do not smoke. Smoking is a common cause of shortness of breath. Ask for help to stop smoking.   Avoid being around chemicals that may bother your breathing (paint fumes, dust).   Rest as needed. Slowly resume your usual activities.   If medicines were prescribed, take them as directed for the full length of time directed. This includes oxygen and any inhaled medicines.   Follow up with your caregiver as directed. Waiting to do so or failure to follow up could result in worsening of  your condition and possible disability or death.   Be sure you understand what to do or who to call if your shortness of breath worsens.  SEEK MEDICAL CARE IF:   Your condition does not improve in the time expected.   You have a hard time doing your normal activities even with rest.   You have any side effects or problems with the medicines prescribed.   You develop any new symptoms.  SEEK IMMEDIATE MEDICAL CARE IF:   Your shortness of breath is getting worse.   You feel lightheaded, faint, or develop a cough not controlled with medicines.   You start coughing up blood.   You have pain with breathing.   You have chest pain or pain in your arms, shoulders, or abdomen.   You have a fever.   You are unable to walk up stairs or exercise the way you normally do.   Your symptoms are getting worse.  Document Released: 08/10/2001 Document Revised: 11/04/2011 Document Reviewed: 03/27/2008 [facility] Patient Information 2012 [place], [place].  The only significant finding was a urinary tract infection with too numerous to count white blood cells patient has had a history of that in the past urine culture has been sent he is not toxic treat with Keflex due to his history of diabetes. The chest x-ray and workup including CT angiogram only shows some mild bronchitis. Patient will return for new worse symptoms.
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
## 2012-04-16 NOTE — ED Notes (Signed)
PER EMS: patient presents with SOB, hx COPD and CHF, patient also reporting dry, non-productive cough since last night, denies chest pain, n/v.  Alert and orientedx 4, patient from home.

## 2012-04-18 ENCOUNTER — Encounter (HOSPITAL_COMMUNITY): Payer: Self-pay | Admitting: Emergency Medicine

## 2012-04-18 ENCOUNTER — Telehealth: Payer: Self-pay | Admitting: Internal Medicine

## 2012-04-18 ENCOUNTER — Inpatient Hospital Stay (HOSPITAL_COMMUNITY)
Admission: EM | Admit: 2012-04-18 | Discharge: 2012-04-21 | DRG: 280 | Disposition: A | Discharge status: Home / Self Care | Payer: Medicare Other | Attending: Internal Medicine | Admitting: Internal Medicine

## 2012-04-18 ENCOUNTER — Emergency Department (HOSPITAL_COMMUNITY): Payer: Medicare Other

## 2012-04-18 DIAGNOSIS — Z8249 Family history of ischemic heart disease and other diseases of the circulatory system: Secondary | ICD-10-CM

## 2012-04-18 DIAGNOSIS — E118 Type 2 diabetes mellitus with unspecified complications: Secondary | ICD-10-CM

## 2012-04-18 DIAGNOSIS — I10 Essential (primary) hypertension: Secondary | ICD-10-CM | POA: Diagnosis present

## 2012-04-18 DIAGNOSIS — I214 Non-ST elevation (NSTEMI) myocardial infarction: Secondary | ICD-10-CM | POA: Diagnosis present

## 2012-04-18 DIAGNOSIS — E039 Hypothyroidism, unspecified: Secondary | ICD-10-CM | POA: Diagnosis present

## 2012-04-18 DIAGNOSIS — Z9861 Coronary angioplasty status: Secondary | ICD-10-CM

## 2012-04-18 DIAGNOSIS — I5031 Acute diastolic (congestive) heart failure: Secondary | ICD-10-CM | POA: Diagnosis present

## 2012-04-18 DIAGNOSIS — D696 Thrombocytopenia, unspecified: Secondary | ICD-10-CM | POA: Diagnosis present

## 2012-04-18 DIAGNOSIS — N400 Enlarged prostate without lower urinary tract symptoms: Secondary | ICD-10-CM | POA: Diagnosis present

## 2012-04-18 DIAGNOSIS — I5033 Acute on chronic diastolic (congestive) heart failure: Principal | ICD-10-CM | POA: Diagnosis present

## 2012-04-18 DIAGNOSIS — C671 Malignant neoplasm of dome of bladder: Secondary | ICD-10-CM | POA: Diagnosis present

## 2012-04-18 DIAGNOSIS — E785 Hyperlipidemia, unspecified: Secondary | ICD-10-CM | POA: Diagnosis present

## 2012-04-18 DIAGNOSIS — E1165 Type 2 diabetes mellitus with hyperglycemia: Secondary | ICD-10-CM

## 2012-04-18 DIAGNOSIS — J189 Pneumonia, unspecified organism: Secondary | ICD-10-CM | POA: Diagnosis present

## 2012-04-18 DIAGNOSIS — I2510 Atherosclerotic heart disease of native coronary artery without angina pectoris: Secondary | ICD-10-CM | POA: Diagnosis present

## 2012-04-18 DIAGNOSIS — E876 Hypokalemia: Secondary | ICD-10-CM | POA: Diagnosis present

## 2012-04-18 DIAGNOSIS — Z833 Family history of diabetes mellitus: Secondary | ICD-10-CM

## 2012-04-18 DIAGNOSIS — Z950 Presence of cardiac pacemaker: Secondary | ICD-10-CM

## 2012-04-18 DIAGNOSIS — I2589 Other forms of chronic ischemic heart disease: Secondary | ICD-10-CM | POA: Diagnosis present

## 2012-04-18 DIAGNOSIS — J159 Unspecified bacterial pneumonia: Secondary | ICD-10-CM

## 2012-04-18 DIAGNOSIS — I5021 Acute systolic (congestive) heart failure: Secondary | ICD-10-CM | POA: Insufficient documentation

## 2012-04-18 DIAGNOSIS — E119 Type 2 diabetes mellitus without complications: Secondary | ICD-10-CM | POA: Diagnosis present

## 2012-04-18 DIAGNOSIS — I739 Peripheral vascular disease, unspecified: Secondary | ICD-10-CM | POA: Diagnosis present

## 2012-04-18 DIAGNOSIS — I509 Heart failure, unspecified: Secondary | ICD-10-CM

## 2012-04-18 DIAGNOSIS — E032 Hypothyroidism due to medicaments and other exogenous substances: Secondary | ICD-10-CM

## 2012-04-18 HISTORY — DX: Presence of automatic (implantable) cardiac defibrillator: Z95.810

## 2012-04-18 HISTORY — DX: Angina pectoris, unspecified: I20.9

## 2012-04-18 HISTORY — DX: Presence of cardiac pacemaker: Z95.0

## 2012-04-18 LAB — CBC
HCT: 40.5 % (ref 39.0–52.0)
Hemoglobin: 13.9 g/dL (ref 13.0–17.0)
Platelets: 104 10*3/uL — ABNORMAL LOW (ref 150–400)
RBC: 4.58 MIL/uL (ref 4.22–5.81)
RDW: 13.5 % (ref 11.5–15.5)
WBC: 4.8 10*3/uL (ref 4.0–10.5)
WBC: 3.9 10*3/uL — ABNORMAL LOW (ref 4.0–10.5)

## 2012-04-18 LAB — DIFFERENTIAL
Basophils Absolute: 0 10*3/uL (ref 0.0–0.1)
Lymphocytes Relative: 11 % — ABNORMAL LOW (ref 12–46)
Monocytes Absolute: 0.3 10*3/uL (ref 0.1–1.0)
Neutro Abs: 3.9 10*3/uL (ref 1.7–7.7)

## 2012-04-18 LAB — PRO B NATRIURETIC PEPTIDE: Pro B Natriuretic peptide (BNP): 2199 pg/mL — ABNORMAL HIGH (ref 0–450)

## 2012-04-18 LAB — URINE MICROSCOPIC-ADD ON

## 2012-04-18 LAB — URINALYSIS, ROUTINE W REFLEX MICROSCOPIC
Bilirubin Urine: NEGATIVE
Specific Gravity, Urine: 1.011 (ref 1.005–1.030)
pH: 5.5 (ref 5.0–8.0)

## 2012-04-18 LAB — CREATININE, SERUM
Creatinine, Ser: 1.2 mg/dL (ref 0.50–1.35)
GFR calc Af Amer: 64 mL/min — ABNORMAL LOW (ref >90)

## 2012-04-18 LAB — URINE CULTURE: Culture  Setup Time: 201305200144

## 2012-04-18 LAB — CARDIAC PANEL(CRET KIN+CKTOT+MB+TROPI)
CK, MB: 5.2 ng/mL — ABNORMAL HIGH (ref 0.3–4.0)
Relative Index: 0.5 (ref 0.0–2.5)
Relative Index: 0.5 (ref 0.0–2.5)
Total CK: 1002 U/L — ABNORMAL HIGH (ref 7–232)
Total CK: 1102 U/L — ABNORMAL HIGH (ref 7–232)
Troponin I: 0.34 ng/mL (ref <0.30)
Troponin I: 0.4 ng/mL (ref <0.30)

## 2012-04-18 LAB — EXPECTORATED SPUTUM ASSESSMENT W GRAM STAIN, RFLX TO RESP C

## 2012-04-18 LAB — BASIC METABOLIC PANEL
CO2: 28 mEq/L (ref 19–32)
Chloride: 94 mEq/L — ABNORMAL LOW (ref 96–112)
Creatinine, Ser: 1.09 mg/dL (ref 0.50–1.35)
Potassium: 3.3 mEq/L — ABNORMAL LOW (ref 3.5–5.1)

## 2012-04-18 MED ORDER — FUROSEMIDE 10 MG/ML IJ SOLN
40.0000 mg | Freq: Two times a day (BID) | INTRAMUSCULAR | Status: DC
  Filled 2012-04-18 (×2): qty 4

## 2012-04-18 MED ORDER — POTASSIUM CHLORIDE CRYS ER 20 MEQ PO TBCR
EXTENDED_RELEASE_TABLET | ORAL | Status: AC
  Filled 2012-04-18: qty 2

## 2012-04-18 MED ORDER — SODIUM CHLORIDE 0.9 % IJ SOLN
3.0000 mL | Freq: Two times a day (BID) | INTRAMUSCULAR | Status: DC
  Administered 2012-04-18 – 2012-04-20 (×3): 3 mL via INTRAVENOUS

## 2012-04-18 MED ORDER — ACETAMINOPHEN 325 MG PO TABS
650.0000 mg | ORAL_TABLET | Freq: Once | ORAL | Status: AC
  Administered 2012-04-18: 650 mg via ORAL
  Filled 2012-04-18: qty 2

## 2012-04-18 MED ORDER — MOXIFLOXACIN HCL IN NACL 400 MG/250ML IV SOLN
400.0000 mg | Freq: Once | INTRAVENOUS | Status: AC
  Administered 2012-04-18: 400 mg via INTRAVENOUS
  Filled 2012-04-18: qty 250

## 2012-04-18 MED ORDER — ALBUTEROL SULFATE (5 MG/ML) 0.5% IN NEBU
5.0000 mg | INHALATION_SOLUTION | Freq: Once | RESPIRATORY_TRACT | Status: AC
  Administered 2012-04-18: 5 mg via RESPIRATORY_TRACT
  Filled 2012-04-18: qty 1

## 2012-04-18 MED ORDER — ATORVASTATIN CALCIUM 40 MG PO TABS
40.0000 mg | ORAL_TABLET | Freq: Every day | ORAL | Status: DC
  Administered 2012-04-18 – 2012-04-20 (×3): 40 mg via ORAL
  Filled 2012-04-18 (×4): qty 1

## 2012-04-18 MED ORDER — ASPIRIN 81 MG PO CHEW
CHEWABLE_TABLET | ORAL | Status: AC
  Administered 2012-04-18: 324 mg
  Filled 2012-04-18: qty 4

## 2012-04-18 MED ORDER — FUROSEMIDE 10 MG/ML IJ SOLN
80.0000 mg | Freq: Two times a day (BID) | INTRAMUSCULAR | Status: AC
  Administered 2012-04-18 – 2012-04-20 (×4): 80 mg via INTRAVENOUS
  Filled 2012-04-18 (×4): qty 8

## 2012-04-18 MED ORDER — SODIUM CHLORIDE 0.9 % IV SOLN: 250.0000 mL | INTRAVENOUS | Status: DC | PRN

## 2012-04-18 MED ORDER — ONDANSETRON HCL 4 MG/2ML IJ SOLN: 4.0000 mg | Freq: Three times a day (TID) | INTRAMUSCULAR | Status: DC | PRN

## 2012-04-18 MED ORDER — DOCUSATE SODIUM 100 MG PO CAPS
100.0000 mg | ORAL_CAPSULE | Freq: Two times a day (BID) | ORAL | Status: DC
  Administered 2012-04-18 – 2012-04-21 (×6): 100 mg via ORAL
  Filled 2012-04-18 (×7): qty 1

## 2012-04-18 MED ORDER — INSULIN GLARGINE 100 UNIT/ML ~~LOC~~ SOLN
30.0000 [IU] | Freq: Every day | SUBCUTANEOUS | Status: DC
  Administered 2012-04-18 – 2012-04-20 (×3): 30 [IU] via SUBCUTANEOUS

## 2012-04-18 MED ORDER — ONDANSETRON HCL 4 MG PO TABS: 4.0000 mg | ORAL_TABLET | Freq: Four times a day (QID) | ORAL | Status: DC | PRN

## 2012-04-18 MED ORDER — INSULIN ASPART 100 UNIT/ML ~~LOC~~ SOLN
0.0000 [IU] | Freq: Three times a day (TID) | SUBCUTANEOUS | Status: DC
  Administered 2012-04-19 (×3): 2 [IU] via SUBCUTANEOUS
  Administered 2012-04-20: 1 [IU] via SUBCUTANEOUS
  Administered 2012-04-20: 5 [IU] via SUBCUTANEOUS
  Administered 2012-04-20 – 2012-04-21 (×2): 3 [IU] via SUBCUTANEOUS
  Administered 2012-04-21: 2 [IU] via SUBCUTANEOUS

## 2012-04-18 MED ORDER — ENOXAPARIN SODIUM 40 MG/0.4ML ~~LOC~~ SOLN
40.0000 mg | SUBCUTANEOUS | Status: DC
  Administered 2012-04-18: 40 mg via SUBCUTANEOUS
  Filled 2012-04-18: qty 0.4

## 2012-04-18 MED ORDER — FINASTERIDE 5 MG PO TABS
5.0000 mg | ORAL_TABLET | Freq: Every day | ORAL | Status: DC
Start: 2012-04-19 — End: 2012-04-21
  Administered 2012-04-19 – 2012-04-21 (×3): 5 mg via ORAL
  Filled 2012-04-18 (×3): qty 1

## 2012-04-18 MED ORDER — LEVOTHYROXINE SODIUM 150 MCG PO TABS
150.0000 ug | ORAL_TABLET | Freq: Every day | ORAL | Status: DC
  Administered 2012-04-19 – 2012-04-21 (×3): 150 ug via ORAL
  Filled 2012-04-18 (×4): qty 1

## 2012-04-18 MED ORDER — SODIUM CHLORIDE 0.9 % IJ SOLN
3.0000 mL | Freq: Two times a day (BID) | INTRAMUSCULAR | Status: DC
  Administered 2012-04-20 – 2012-04-21 (×2): 3 mL via INTRAVENOUS

## 2012-04-18 MED ORDER — CARVEDILOL 25 MG PO TABS
25.0000 mg | ORAL_TABLET | Freq: Two times a day (BID) | ORAL | Status: DC
  Administered 2012-04-18 – 2012-04-21 (×6): 25 mg via ORAL
  Filled 2012-04-18 (×8): qty 1

## 2012-04-18 MED ORDER — ASPIRIN 81 MG PO TABS
324.0000 mg | ORAL_TABLET | Freq: Once | ORAL | Status: AC
  Administered 2012-04-18: 324 mg via ORAL
  Filled 2012-04-18: qty 4

## 2012-04-18 MED ORDER — ZOLPIDEM TARTRATE 5 MG PO TABS
5.0000 mg | ORAL_TABLET | Freq: Once | ORAL | Status: AC
  Administered 2012-04-18: 5 mg via ORAL
  Filled 2012-04-18: qty 1

## 2012-04-18 MED ORDER — POTASSIUM CHLORIDE CRYS ER 20 MEQ PO TBCR
20.0000 meq | EXTENDED_RELEASE_TABLET | Freq: Two times a day (BID) | ORAL | Status: AC
  Administered 2012-04-18 – 2012-04-20 (×4): 20 meq via ORAL
  Filled 2012-04-18 (×4): qty 1

## 2012-04-18 MED ORDER — MOXIFLOXACIN HCL IN NACL 400 MG/250ML IV SOLN
400.0000 mg | INTRAVENOUS | Status: DC
  Administered 2012-04-19: 400 mg via INTRAVENOUS
  Filled 2012-04-18: qty 250

## 2012-04-18 MED ORDER — SODIUM CHLORIDE 0.9 % IJ SOLN: 3.0000 mL | INTRAMUSCULAR | Status: DC | PRN

## 2012-04-18 MED ORDER — PANTOPRAZOLE SODIUM 40 MG PO TBEC
40.0000 mg | DELAYED_RELEASE_TABLET | Freq: Every day | ORAL | Status: DC
  Administered 2012-04-19 – 2012-04-21 (×3): 40 mg via ORAL
  Filled 2012-04-18 (×3): qty 1

## 2012-04-18 MED ORDER — FUROSEMIDE 10 MG/ML IJ SOLN
60.0000 mg | Freq: Once | INTRAMUSCULAR | Status: AC
  Administered 2012-04-18: 60 mg via INTRAVENOUS
  Filled 2012-04-18: qty 6

## 2012-04-18 MED ORDER — IPRATROPIUM BROMIDE 0.02 % IN SOLN
0.5000 mg | Freq: Once | RESPIRATORY_TRACT | Status: AC
  Administered 2012-04-18: 0.5 mg via RESPIRATORY_TRACT
  Filled 2012-04-18: qty 2.5

## 2012-04-18 MED ORDER — ISOSORBIDE DINITRATE 30 MG PO TABS
30.0000 mg | ORAL_TABLET | Freq: Two times a day (BID) | ORAL | Status: DC
  Administered 2012-04-18 – 2012-04-21 (×6): 30 mg via ORAL
  Filled 2012-04-18 (×7): qty 1

## 2012-04-18 MED ORDER — ONDANSETRON HCL 4 MG/2ML IJ SOLN: 4.0000 mg | Freq: Four times a day (QID) | INTRAMUSCULAR | Status: DC | PRN

## 2012-04-18 MED ORDER — POTASSIUM CHLORIDE 20 MEQ/15ML (10%) PO LIQD
40.0000 meq | Freq: Once | ORAL | Status: AC
  Administered 2012-04-18: 40 meq via ORAL
  Filled 2012-04-18: qty 30

## 2012-04-18 MED ORDER — NITROGLYCERIN 2 % TD OINT
1.0000 [in_us] | TOPICAL_OINTMENT | Freq: Once | TRANSDERMAL | Status: AC
  Administered 2012-04-18: 1 [in_us] via TOPICAL
  Filled 2012-04-18: qty 1

## 2012-04-18 MED ORDER — ALBUTEROL SULFATE (5 MG/ML) 0.5% IN NEBU
2.5000 mg | INHALATION_SOLUTION | RESPIRATORY_TRACT | Status: DC | PRN
  Administered 2012-04-19: 2.5 mg via RESPIRATORY_TRACT
  Filled 2012-04-18: qty 0.5

## 2012-04-18 MED ORDER — ALBUTEROL SULFATE (5 MG/ML) 0.5% IN NEBU: 2.5000 mg | INHALATION_SOLUTION | RESPIRATORY_TRACT | Status: DC | PRN

## 2012-04-18 MED ORDER — CLOPIDOGREL BISULFATE 75 MG PO TABS
75.0000 mg | ORAL_TABLET | Freq: Every day | ORAL | Status: DC
  Administered 2012-04-19 – 2012-04-21 (×3): 75 mg via ORAL
  Filled 2012-04-18 (×3): qty 1

## 2012-04-18 NOTE — ED Notes (Signed)
Sharlot Gowda, RT at bedside.

## 2012-04-18 NOTE — Telephone Encounter (Signed)
The patient called and was requesting an appointment for today.  I offered him an 11am appointment, but he stated he could not wait one hour and 15 min for medical care.  I advised him if he felt it was that urgent, he would need to seek emergency care.

## 2012-04-18 NOTE — ED Notes (Addendum)
Dr Regalado at bedside.  
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
## 2012-04-18 NOTE — Progress Notes (Signed)
Phone call/page-RN called NP 2/2 pt having bright red blood tinged sputum x 1. No coffee grounds. RN says sputum cx has been sent. Also, RN related 2 + troponins.  NP reviewed chart. Cardio has seen and thinks + trop elevation is 2/2 demand ischemia and did not order Heparin. Platelet count noted to be a tad lower than admission, so Lovenox d/c'd and SCD ordered. Told RN to watch sputum/blood and call if worse. CBC ordered for a.m. Maren Reamer, NP [facility]
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
## 2012-04-18 NOTE — ED Notes (Signed)
Per EMS, Pt with SOB and generalized weakness.  Pt seen Sunday at ED for UTI and given antibiotics.  Pt without home O2 at this time.  Denies chest pain at this time.

## 2012-04-19 ENCOUNTER — Encounter (HOSPITAL_COMMUNITY): Payer: Self-pay | Admitting: General Practice

## 2012-04-19 DIAGNOSIS — E118 Type 2 diabetes mellitus with unspecified complications: Secondary | ICD-10-CM

## 2012-04-19 DIAGNOSIS — I5043 Acute on chronic combined systolic (congestive) and diastolic (congestive) heart failure: Secondary | ICD-10-CM

## 2012-04-19 DIAGNOSIS — I509 Heart failure, unspecified: Secondary | ICD-10-CM

## 2012-04-19 DIAGNOSIS — E032 Hypothyroidism due to medicaments and other exogenous substances: Secondary | ICD-10-CM

## 2012-04-19 DIAGNOSIS — E1165 Type 2 diabetes mellitus with hyperglycemia: Secondary | ICD-10-CM

## 2012-04-19 DIAGNOSIS — I369 Nonrheumatic tricuspid valve disorder, unspecified: Secondary | ICD-10-CM

## 2012-04-19 DIAGNOSIS — J159 Unspecified bacterial pneumonia: Secondary | ICD-10-CM

## 2012-04-19 LAB — HEMOGLOBIN A1C
Hgb A1c MFr Bld: 9.1 % — ABNORMAL HIGH (ref <5.7)
Mean Plasma Glucose: 214 mg/dL — ABNORMAL HIGH (ref <117)

## 2012-04-19 LAB — COMPREHENSIVE METABOLIC PANEL
Alkaline Phosphatase: 48 U/L (ref 39–117)
BUN: 19 mg/dL (ref 6–23)
CO2: 30 mEq/L (ref 19–32)
GFR calc Af Amer: 63 mL/min — ABNORMAL LOW (ref >90)
GFR calc non Af Amer: 54 mL/min — ABNORMAL LOW (ref >90)
Glucose, Bld: 142 mg/dL — ABNORMAL HIGH (ref 70–99)
Potassium: 3.2 mEq/L — ABNORMAL LOW (ref 3.5–5.1)
Total Protein: 6.6 g/dL (ref 6.0–8.3)

## 2012-04-19 LAB — PROTIME-INR
INR: 1.19 (ref 0.00–1.49)
Prothrombin Time: 15.4 seconds — ABNORMAL HIGH (ref 11.6–15.2)

## 2012-04-19 LAB — GLUCOSE, CAPILLARY
Glucose-Capillary: 244 mg/dL — ABNORMAL HIGH (ref 70–99)
Glucose-Capillary: 155 mg/dL — ABNORMAL HIGH (ref 70–99)
Glucose-Capillary: 184 mg/dL — ABNORMAL HIGH (ref 70–99)
Glucose-Capillary: 166 mg/dL — ABNORMAL HIGH (ref 70–99)

## 2012-04-19 LAB — CBC
HCT: 38 % — ABNORMAL LOW (ref 39.0–52.0)
Platelets: 104 10*3/uL — ABNORMAL LOW (ref 150–400)
RDW: 13.6 % (ref 11.5–15.5)
WBC: 3.6 10*3/uL — ABNORMAL LOW (ref 4.0–10.5)

## 2012-04-19 LAB — CARDIAC PANEL(CRET KIN+CKTOT+MB+TROPI)
Relative Index: 0.4 (ref 0.0–2.5)
Troponin I: 0.34 ng/mL (ref <0.30)

## 2012-04-19 LAB — T3, FREE: T3, Free: 1.8 pg/mL — ABNORMAL LOW (ref 2.3–4.2)

## 2012-04-19 LAB — MAGNESIUM: Magnesium: 2 mg/dL (ref 1.5–2.5)

## 2012-04-19 MED ORDER — LEVOFLOXACIN IN D5W 750 MG/150ML IV SOLN
750.0000 mg | INTRAVENOUS | Status: DC
  Administered 2012-04-19 – 2012-04-20 (×2): 750 mg via INTRAVENOUS
  Filled 2012-04-19 (×3): qty 150

## 2012-04-19 MED ORDER — PREDNISONE 20 MG PO TABS
40.0000 mg | ORAL_TABLET | Freq: Every day | ORAL | Status: DC
  Administered 2012-04-20: 40 mg via ORAL
  Filled 2012-04-19 (×2): qty 2

## 2012-04-19 MED ORDER — SODIUM CHLORIDE 0.9 % IV SOLN
2000.0000 mg | Freq: Once | INTRAVENOUS | Status: AC
  Administered 2012-04-19: 2000 mg via INTRAVENOUS
  Filled 2012-04-19: qty 2000

## 2012-04-19 MED ORDER — POTASSIUM CHLORIDE 20 MEQ/15ML (10%) PO LIQD
40.0000 meq | Freq: Once | ORAL | Status: AC
  Administered 2012-04-19: 40 meq via ORAL
  Filled 2012-04-19: qty 30

## 2012-04-19 MED ORDER — VANCOMYCIN HCL IN DEXTROSE 1-5 GM/200ML-% IV SOLN
1000.0000 mg | Freq: Two times a day (BID) | INTRAVENOUS | Status: DC
  Administered 2012-04-20 – 2012-04-21 (×3): 1000 mg via INTRAVENOUS
  Filled 2012-04-19 (×5): qty 200

## 2012-04-19 NOTE — Progress Notes (Signed)
[name] is a pleasant 78 y.o. male admitted with worsening shortness of breath and cough, which has been productive since last night. He ruled in for NSTEMI. A ct chest suggests bronchitis/PNA. I have reviewed his chart, seen and examined him at bed side. Appreciate cardiology input. Noted night development of some tinge of hemoptysis.  SUBJECTIVE says he feels about the same as yesterday.  1. CHF exacerbation   2. Elevated troponin   3. Community acquired pneumonia     Past Medical History  Diagnosis Date  . Candidiasis of the esophagus 10/26/2007  . Benign neoplasm of esophagus 12/29/2006  . HYPOTHYROIDISM 10/26/2007  . DIABETES MELLITUS, TYPE II 06/11/2007  . HYPERLIPIDEMIA 06/11/2007  . DELUSIONAL DISORDER 06/11/2007  . HYPERTENSION 06/11/2007  . AMI 06/11/2007  . MYOCARDIAL INFARCTION, HX OF 10/26/2007  . CORONARY ARTERY DISEASE 06/11/2007  . SYSTOLIC HEART FAILURE, CHRONIC 01/13/2009  . PERIPHERAL VASCULAR DISEASE 10/26/2007  . ALLERGIC RHINITIS 10/26/2007  . PNEUMONIA, COMMUNITY ACQUIRED, PNEUMOCOCCAL 01/23/2010  . ACUTE GINGIVITIS PLAQUE INDUCED 07/30/2009  . ABSCESS, MOUTH 01/01/2009  . ESOPHAGITIS 06/17/2008  . GERD 06/11/2007  . DIVERTICULOSIS, COLON 10/26/2007  . RENAL INSUFFICIENCY 10/26/2007  . UTI 12/07/2007  . BENIGN PROSTATIC HYPERTROPHY 08/27/2009  . BOILS, RECURRENT 10/02/2008  . Cellulitis and abscess of buttock 09/10/2008  . FOOT ULCER, LEFT 10/27/2007  . BACK PAIN 11/14/2007  . ARTERIOVENOUS MALFORMATION, COLON 10/26/2007  . HYPERSOMNIA 05/30/2008  . FATIGUE 05/30/2008  . PARESTHESIA 05/30/2008  . CHEST PAIN 11/26/2008  . NAUSEA 08/27/2009  . Dysuria 08/27/2009  . URINARY RETENTION 12/07/2007  . PSA, INCREASED 10/26/2007  . 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 Foreign body in esophagus 01/29/2005  . PROSTATE CANCER, HX OF 07/30/2009  . COLONIC POLYPS, HX OF 10/26/2007  . AUTOMATIC IMPLANTABLE CARDIAC DEFIBRILLATOR SITU 05/27/2009   Current  Facility-Administered Medications  Medication Dose Route Frequency Provider Last Rate Last Dose  . 0.9 %  sodium chloride infusion  250 mL Intravenous PRN Belkys A Regalado, MD      . albuterol (PROVENTIL) (5 MG/ML) 0.5% nebulizer solution 2.5 mg  2.5 mg Nebulization Q2H PRN Belkys A Regalado, MD      . aspirin 81 MG chewable tablet        324 mg at 04/18/12 1338  . aspirin tablet 324 mg  324 mg Oral Once Nat Christen, MD   324 mg at 04/18/12 1339  . atorvastatin (LIPITOR) tablet 40 mg  40 mg Oral q1800 Belkys A Regalado, MD   40 mg at 04/18/12 1833  . carvedilol (COREG) tablet 25 mg  25 mg Oral BID WC Belkys A Regalado, MD   25 mg at 04/19/12 4696  . clopidogrel (PLAVIX) tablet 75 mg  75 mg Oral Daily Belkys A Regalado, MD   75 mg at 04/19/12 1037  . docusate sodium (COLACE) capsule 100 mg  100 mg Oral BID Belkys A Regalado, MD   100 mg at 04/19/12 1036  . finasteride (PROSCAR) tablet 5 mg  5 mg Oral Daily Belkys A Regalado, MD   5 mg at 04/19/12 1037  . furosemide (LASIX) injection 60 mg  60 mg Intravenous Once Nat Christen, MD   60 mg at 04/18/12 1337  . furosemide (LASIX) injection 80 mg  80 mg Intravenous BID Gaylord Shih, MD   80 mg at 04/19/12 1036  . insulin aspart (novoLOG) injection 0-9 Units  0-9 Units Subcutaneous TID WC Belkys Bonita Quin, MD  2 Units at 04/19/12 1146  . insulin glargine (LANTUS) injection 30 Units  30 Units Subcutaneous QHS Belkys A Regalado, MD   30 Units at 04/18/12 2119  . isosorbide dinitrate (ISORDIL) tablet 30 mg  30 mg Oral BID Belkys A Regalado, MD   30 mg at 04/19/12 1036  . levothyroxine (SYNTHROID, LEVOTHROID) tablet 150 mcg  150 mcg Oral QAC breakfast Belkys A Regalado, MD   150 mcg at 04/19/12 0454  . moxifloxacin (AVELOX) IVPB 400 mg  400 mg Intravenous Once Nat Christen, MD   400 mg at 04/18/12 1355  . moxifloxacin (AVELOX) IVPB 400 mg  400 mg Intravenous Q24H Belkys A Regalado, MD   400 mg at 04/19/12 0846  . nitroGLYCERIN (NITROGLYN)  2 % ointment 1 inch  1 inch Topical Once Nat Christen, MD   1 inch at 04/18/12 1338  . ondansetron (ZOFRAN) tablet 4 mg  4 mg Oral Q6H PRN Belkys A Regalado, MD       Or  . ondansetron (ZOFRAN) injection 4 mg  4 mg Intravenous Q6H PRN Belkys A Regalado, MD      . pantoprazole (PROTONIX) EC tablet 40 mg  40 mg Oral Q1200 Belkys A Regalado, MD      . potassium chloride 20 MEQ/15ML (10%) liquid 40 mEq  40 mEq Oral Once Nat Christen, MD   40 mEq at 04/18/12 1339  . potassium chloride 20 MEQ/15ML (10%) liquid 40 mEq  40 mEq Oral Once Zurich Carreno, MD      . potassium chloride SA (K-DUR,KLOR-CON) CR tablet 20 mEq  20 mEq Oral BID Belkys A Regalado, MD   20 mEq at 04/19/12 1037  . predniSONE (DELTASONE) tablet 40 mg  40 mg Oral Q breakfast Eean Buss, MD      . sodium chloride 0.9 % injection 3 mL  3 mL Intravenous Q12H Belkys A Regalado, MD   3 mL at 04/18/12 2118  . sodium chloride 0.9 % injection 3 mL  3 mL Intravenous Q12H Belkys A Regalado, MD      . sodium chloride 0.9 % injection 3 mL  3 mL Intravenous PRN Belkys A Regalado, MD      . zolpidem (AMBIEN) tablet 5 mg  5 mg Oral Once Caroline More, NP   5 mg at 04/18/12 2225  . DISCONTD: albuterol (PROVENTIL) (5 MG/ML) 0.5% nebulizer solution 2.5 mg  2.5 mg Nebulization Q4H PRN Nat Christen, MD      . DISCONTD: enoxaparin (LOVENOX) injection 40 mg  40 mg Subcutaneous Q24H Belkys A Regalado, MD   40 mg at 04/18/12 1833  . DISCONTD: furosemide (LASIX) injection 40 mg  40 mg Intravenous BID Belkys A Regalado, MD      . DISCONTD: ondansetron (ZOFRAN) injection 4 mg  4 mg Intravenous Q8H PRN Nat Christen, MD      . DISCONTD: potassium chloride SA (K-DUR,KLOR-CON) 20 MEQ CR tablet            Allergies  Allergen Reactions  . Oxycodone-Acetaminophen Nausea And Vomiting  . Oxycodone-Aspirin Nausea And Vomiting   Active Problems:  HYPOTHYROIDISM  DIABETES MELLITUS, TYPE II  HYPERLIPIDEMIA  HYPERTENSION  CORONARY ARTERY  DISEASE  PERIPHERAL VASCULAR DISEASE  PNA (pneumonia)  Heart failure, diastolic, acute   Vital signs in last 24 hours: Temp:  [98.8 F (37.1 C)-100.3 F (37.9 C)] 99.4 F (37.4 C) (05/22 0937) Pulse Rate:  [65-83] 65  (05/22 0937) Resp:  [20-30] 22  (  05/22 0937) BP: (134-166)/(45-82) 137/45 mmHg (05/22 0937) SpO2:  [93 %-100 %] 100 % (05/22 0937) Weight:  [124.2 kg (273 lb 13 oz)-124.9 kg (275 lb 5.7 oz)] 124.2 kg (273 lb 13 oz) (05/22 1610) Weight change:  Last BM Date: 04/15/12  Intake/Output from previous day: 05/21 0701 - 05/22 0700 In: 948 [P.O.:940; IV Piggyback:8] Out: 1200 [Urine:1200] Intake/Output this shift: Total I/O In: 250 [IV Piggyback:250] Out: -   Lab Results:  Basename 04/19/12 0827 04/18/12 1640  WBC 3.6* 3.9*  HGB 12.8* 13.3  HCT 38.0* 38.8*  PLT 104* 104*   BMET  Basename 04/19/12 0827 04/18/12 1640 04/18/12 1145  NA 139 -- 133*  K 3.2* -- 3.3*  CL 99 -- 94*  CO2 30 -- 28  GLUCOSE 142* -- 238*  BUN 19 -- 18  CREATININE 1.21 1.20 --  CALCIUM 8.5 -- 8.8    Studies/Results: Dg Chest 2 View  04/18/2012  *RADIOLOGY REPORT*  Clinical Data: Shortness of breath, history COPD, CHF, diabetes, hypertension, coronary artery disease post MI  CHEST - 2 VIEW  Comparison: 04/16/2012  Findings: Left subclavian pacemaker / AICD leads project over right atrium, right ventricle, and coronary sinus. Enlargement of cardiac silhouette with pulmonary vascular congestion. Minimal tortuosity thoracic aorta. Minimal chronic peribronchial thickening. Atelectasis versus consolidation retrocardiac left lower lobe. Remaining lungs clear. No pleural effusion or pneumothorax.  IMPRESSION: Enlargement of cardiac silhouette with pulmonary vascular congestion post pacemaker / AICD. Retrocardiac left lower lobe atelectasis versus consolidation.  Original Report Authenticated By: Lollie Marrow, M.D.    Medications: I have reviewed the patient's current medications.   Physical  exam GENERAL- alert HEAD- normal atraumatic, no neck masses, normal thyroid, no jvd RESPIRATORY- Bilateral transmitted sounds, more on the bases. CVS- regular rate and rhythm, S1, S2 normal, no murmur, click, rub or gallop ABDOMEN- abdomen is soft without significant tenderness, masses, organomegaly or guarding NEURO- Grossly normal EXTREMITIES- prosthesis right leg. No pedal edema left foot.  Plan  * PNA (pneumonia)/element of bronchitis- continue abx to complete 7 days(change to vanc/levaquin to cover resistant organisms, as immunocompromise, not improving. Add prednisone, short course.  * Heart failure, diastolic, acute/NSTEMI- Await 2decho. Appreciate cardiology. Defer mx to cardiology. * HYPOTHYROIDISM- stable * DIABETES MELLITUS, TYPE II- controlled. Expect slight bump in glucose with steroids. * Hypokalemia- due to diuresis. Replenish, Check magnesium. * HYPERLIPIDEMIA  HYPERTENSION  CORONARY ARTERY DISEASE  PERIPHERAL VASCULAR DISEASE    Jillyan Plitt 04/19/2012 12:33 PM Pager: [phone].

## 2012-04-19 NOTE — Progress Notes (Signed)
Inpatient Diabetes Program Recommendations  AACE/ADA: New Consensus Statement on Inpatient Glycemic Control (2009)  Target Ranges:  Prepandial:   less than 140 mg/dL      Peak postprandial:   less than 180 mg/dL (1-2 hours)      Critically ill patients:  140 - 180 mg/dL   Reason for Visit: Elevated HgbA1C and diet recommendations while here.  Inpatient Diabetes Program Recommendations HgbA1C: Has come down from 10.1 on 10/06/11 to 9.1 on 04/18/12..Improvement! Diet: Patient on carb modified -low.  Pt can be on level moderate and still lose weight.  Low level is very little to eat.  Note: Thank you, Lenor Coffin, RN, CNS, Diabetes Coordinator [phone])

## 2012-04-19 NOTE — Progress Notes (Signed)
ANTIBIOTIC CONSULT NOTE - INITIAL  Pharmacy Consult for Vancomycin and Levaquin Indication: pneumonia  Allergies  Allergen Reactions  . Oxycodone-Acetaminophen Nausea And Vomiting  . Oxycodone-Aspirin Nausea And Vomiting    Patient Measurements: Height: 6\' 2"  (188 cm) Weight: 273 lb 13 oz (124.2 kg) (bed weight) IBW/kg (Calculated) : 82.2   Vital Signs: Temp: 99.4 F (37.4 C) (05/22 0937) Temp src: Oral (05/22 0937) BP: 137/45 mmHg (05/22 0937) Pulse Rate: 65  (05/22 0937) Intake/Output from previous day: 05/21 0701 - 05/22 0700 In: 948 [P.O.:940; IV Piggyback:8] Out: 1200 [Urine:1200] Intake/Output from this shift: Total I/O In: 250 [IV Piggyback:250] Out: -   Labs:  Basename 04/19/12 0827 04/18/12 1640 04/18/12 1145  WBC 3.6* 3.9* 4.8  HGB 12.8* 13.3 13.9  PLT 104* 104* 108*  LABCREA -- -- --  CREATININE 1.21 1.20 1.09   Estimated Creatinine Clearance: 67 ml/min (by C-G formula based on Cr of 1.21). No results found for this basename: VANCOTROUGH:2,VANCOPEAK:2,VANCORANDOM:2,GENTTROUGH:2,GENTPEAK:2,GENTRANDOM:2,TOBRATROUGH:2,TOBRAPEAK:2,TOBRARND:2,AMIKACINPEAK:2,AMIKACINTROU:2,AMIKACIN:2, in the last 72 hours   Microbiology: Recent Results (from the past 720 hour(s))  URINE CULTURE     Status: Normal   Collection Time   04/16/12 12:34 PM      Component Value Range Status Comment   Specimen Description URINE, CLEAN CATCH   Final    Special Requests ADDED ON 04/16/12 AT 1615   Final    Culture  Setup Time [id number]   Final    Colony Count 7,000 COLONIES/ML   Final    Culture INSIGNIFICANT GROWTH   Final    Report Status 04/18/2012 FINAL   Final   CULTURE, EXPECTORATED SPUTUM-ASSESSMENT     Status: Normal   Collection Time   04/18/12  7:27 PM      Component Value Range Status Comment   Specimen Description SPUTUM   Final    Special Requests NONE   Final    Sputum evaluation     Final    Value: THIS SPECIMEN IS ACCEPTABLE. RESPIRATORY CULTURE REPORT TO  FOLLOW.   Report Status 04/18/2012 FINAL   Final   CULTURE, RESPIRATORY     Status: Normal (Preliminary result)   Collection Time   04/18/12  7:27 PM      Component Value Range Status Comment   Specimen Description SPUTUM   Final    Special Requests NONE   Final    Gram Stain     Final    Value: FEW WBC PRESENT,BOTH PMN AND MONONUCLEAR     NO SQUAMOUS EPITHELIAL CELLS SEEN     RARE GRAM POSITIVE COCCI IN PAIRS     RARE GRAM POSITIVE RODS     RARE GRAM NEGATIVE RODS   Culture NO GROWTH 1 DAY   Final    Report Status PENDING   Incomplete   MRSA PCR SCREENING     Status: Normal   Collection Time   04/18/12  8:24 PM      Component Value Range Status Comment   MRSA by PCR NEGATIVE  NEGATIVE  Final     Medical History: Past Medical History  Diagnosis Date  . Candidiasis of the esophagus 10/26/2007  . Benign neoplasm of esophagus 12/29/2006  . HYPOTHYROIDISM 10/26/2007  . DIABETES MELLITUS, TYPE II 06/11/2007  . HYPERLIPIDEMIA 06/11/2007  . DELUSIONAL DISORDER 06/11/2007  . HYPERTENSION 06/11/2007  . AMI 06/11/2007  . MYOCARDIAL INFARCTION, HX OF 10/26/2007  . CORONARY ARTERY DISEASE 06/11/2007  . SYSTOLIC HEART FAILURE, CHRONIC 01/13/2009  . PERIPHERAL VASCULAR DISEASE 10/26/2007  .  ALLERGIC RHINITIS 10/26/2007  . PNEUMONIA, COMMUNITY ACQUIRED, PNEUMOCOCCAL 01/23/2010  . ACUTE GINGIVITIS PLAQUE INDUCED 07/30/2009  . ABSCESS, MOUTH 01/01/2009  . ESOPHAGITIS 06/17/2008  . GERD 06/11/2007  . DIVERTICULOSIS, COLON 10/26/2007  . RENAL INSUFFICIENCY 10/26/2007  . UTI 12/07/2007  . BENIGN PROSTATIC HYPERTROPHY 08/27/2009  . BOILS, RECURRENT 10/02/2008  . Cellulitis and abscess of buttock 09/10/2008  . FOOT ULCER, LEFT 10/27/2007  . BACK PAIN 11/14/2007  . ARTERIOVENOUS MALFORMATION, COLON 10/26/2007  . HYPERSOMNIA 05/30/2008  . FATIGUE 05/30/2008  . PARESTHESIA 05/30/2008  . CHEST PAIN 11/26/2008  . NAUSEA 08/27/2009  . Dysuria 08/27/2009  . URINARY RETENTION 12/07/2007  . PSA, INCREASED 10/26/2007    . 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 Foreign body in esophagus 01/29/2005  . PROSTATE CANCER, HX OF 07/30/2009  . COLONIC POLYPS, HX OF 10/26/2007  . AUTOMATIC IMPLANTABLE CARDIAC DEFIBRILLATOR SITU 05/27/2009    Medications:  Scheduled:    . aspirin      . aspirin  324 mg Oral Once  . atorvastatin  40 mg Oral q1800  . carvedilol  25 mg Oral BID WC  . clopidogrel  75 mg Oral Daily  . docusate sodium  100 mg Oral BID  . finasteride  5 mg Oral Daily  . furosemide  60 mg Intravenous Once  . furosemide  80 mg Intravenous BID  . insulin aspart  0-9 Units Subcutaneous TID WC  . insulin glargine  30 Units Subcutaneous QHS  . isosorbide dinitrate  30 mg Oral BID  . levothyroxine  150 mcg Oral QAC breakfast  . moxifloxacin  400 mg Intravenous Once  . nitroGLYCERIN  1 inch Topical Once  . pantoprazole  40 mg Oral Q1200  . potassium chloride  40 mEq Oral Once  . potassium chloride  40 mEq Oral Once  . potassium chloride  20 mEq Oral BID  . predniSONE  40 mg Oral Q breakfast  . sodium chloride  3 mL Intravenous Q12H  . sodium chloride  3 mL Intravenous Q12H  . zolpidem  5 mg Oral Once  . DISCONTD: enoxaparin  40 mg Subcutaneous Q24H  . DISCONTD: furosemide  40 mg Intravenous BID  . DISCONTD: moxifloxacin  400 mg Intravenous Q24H  . DISCONTD: potassium chloride SA       Assessment: 78 yo male admitted with worsening SOB and cough. Per MD note, CT chest suggests bronchitis/PNA. Of note, patient was discharged from the ED 5/19 with a UTI and given Kelflex for treatment. Pharmacy consulted to begin vancomycin and Levaquin for CAP. Sputum culture pending. Patient received Avelox 5/21-22.  Goal of Therapy:  resolution of PNA  Plan:  Vancomycin 2 g IV now then 1 g IV q12h Levaquin 750 mg IV q24h Will follow-up renal function and [employer], [address].D., BCPS Clinical Pharmacist 04/19/2012 12:59 PM

## 2012-04-19 NOTE — Progress Notes (Signed)
pt had 5 beat run of V-tach, asymptomatic. BP=145/57, HR= 75. MD notified, will continue to monitor.

## 2012-04-19 NOTE — Care Management Note (Addendum)
    Page 1 of 2   04/21/2012     12:38:59 PM   CARE MANAGEMENT NOTE 04/21/2012  Patient:  [name], [name]   Account Number:  [id number]  Date Initiated:  04/19/2012  Documentation initiated by:  Fransico Michael  Subjective/Objective Assessment:   admitted on 04/18/12 with c/o worsening shortness of breath.     Action/Plan:   IV ABX  IV lasix   Anticipated DC Date:  04/22/2012   Anticipated DC Plan:  HOME W HOME HEALTH SERVICES      DC Planning Services  CM consult      Choice offered to / List presented to:  C-1 Patient        HH arranged  HH-1 RN  HH-10 DISEASE MANAGEMENT      HH agency  [facility]   Status of service:  Completed, signed off Medicare Important Message given?  NO (If response is "NO", the following Medicare IM given date fields will be blank) Date Medicare IM given:   Date Additional Medicare IM given:    Discharge Disposition:  HOME W HOME HEALTH SERVICES  Per UR Regulation:  Reviewed for med. necessity/level of care/duration of stay  If discussed at Long Length of Stay Meetings, dates discussed:    Comments:  PCP: Belinda FisherCarolee Rota (niece) [phone]   04/21/12 1230 Spoke with pt. about HH services and pt. stated he had [facility] in past and was satisfied with their care.  Pt. did not want HH PT/OT, only was interested in having a HH RN to come out to check on him. TC to [facility] with [facility] to make referral for [facility] RN for HF management and to make aware of discharge today.  SOC to begin within 24 to 48hr post discharge. Tera Mater, RN, BSN NCM [phone]   04/19/12-1443-[name] [phone]      78yo male patient admitted on 04/18/12 with c/o worsening shortness of breath. Noted to have community acquired pneumonia. Prior to admission, patient lived at home alone. Independent with ADLs. Physical therapy eval ordered. CM will continue to monitor for discharge/home needs.

## 2012-04-19 NOTE — Progress Notes (Signed)
  Echocardiogram 2D Echocardiogram has been performed.  Emelia Loron A 04/19/2012, 3:20 PM

## 2012-04-19 NOTE — Progress Notes (Signed)
Utilization review completed.  
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
## 2012-04-19 NOTE — Progress Notes (Signed)
Patient ID: [name], male   DOB: [birth year], 78 y.o.   MRN: [id number] Subjective:  Dyspnea improved  Objective:  Vital Signs in the last 24 hours: Temp:  [98.8 F (37.1 C)-99.9 F (37.7 C)] 99.4 F (37.4 C) (05/22 0937) Pulse Rate:  [65-83] 65  (05/22 0937) Resp:  [20-22] 22  (05/22 0937) BP: (137-166)/(45-82) 137/45 mmHg (05/22 0937) SpO2:  [93 %-100 %] 100 % (05/22 0937) Weight:  [273 lb 13 oz (124.2 kg)-275 lb 5.7 oz (124.9 kg)] 273 lb 13 oz (124.2 kg) (05/22 8657)  Intake/Output from previous day: 05/21 0701 - 05/22 0700 In: 948 [P.O.:940; IV Piggyback:8] Out: 1200 [Urine:1200] Intake/Output from this shift: Total I/O In: 250 [IV Piggyback:250] Out: -   Physical Exam: Well appearing elderly man, NAD HEENT: Unremarkable Neck:  8 cm JVD, no thyromegally Lungs:  ClearWith no wheezes, or rhonchi. Scattered rales are present.  HEART:  Regular rate rhythm, no murmurs, no rubs, no clicks Abd:  Flat, positive bowel sounds, no organomegally, no rebound, no guarding Ext:  Left below the knee amputation  Skin:  No rashes no nodules Neuro:  CN II through XII intact, motor grossly intact  Lab Results:  Basename 04/19/12 0827 04/18/12 1640  WBC 3.6* 3.9*  HGB 12.8* 13.3  PLT 104* 104*    Basename 04/19/12 0827 04/18/12 1640 04/18/12 1145  NA 139 -- 133*  K 3.2* -- 3.3*  CL 99 -- 94*  CO2 30 -- 28  GLUCOSE 142* -- 238*  BUN 19 -- 18  CREATININE 1.21 1.20 --    Basename 04/19/12 0832 04/18/12 2326  TROPONINI 0.34* 0.34*   Hepatic Function Panel  Basename 04/19/12 0827  PROT 6.6  ALBUMIN 2.9*  AST 36  ALT 17  ALKPHOS 48  BILITOT 0.9  BILIDIR --  IBILI --   No results found for this basename: CHOL in the last 72 hours No results found for this basename: PROTIME in the last 72 hours  Imaging: Dg Chest 2 View  04/18/2012  *RADIOLOGY REPORT*  Clinical Data: Shortness of breath, history COPD, CHF, diabetes, hypertension, coronary artery disease post MI   CHEST - 2 VIEW  Comparison: 04/16/2012  Findings: Left subclavian pacemaker / AICD leads project over right atrium, right ventricle, and coronary sinus. Enlargement of cardiac silhouette with pulmonary vascular congestion. Minimal tortuosity thoracic aorta. Minimal chronic peribronchial thickening. Atelectasis versus consolidation retrocardiac left lower lobe. Remaining lungs clear. No pleural effusion or pneumothorax.  IMPRESSION: Enlargement of cardiac silhouette with pulmonary vascular congestion post pacemaker / AICD. Retrocardiac left lower lobe atelectasis versus consolidation.  Original Report Authenticated By: Lollie Marrow, M.D.    Cardiac Studies:  Assessment/Plan:  1. Acute on chronic systolic diastolic heart failure - his dyspnea is improved. We'll continue intravenous diuresis.  2. hypokalemia  - will need potassium supplementation particularly in the setting of intravenous diuretic therapy.   LOS: 1 day    Lewayne Bunting 04/19/2012, 3:00 PM

## 2012-04-20 DIAGNOSIS — E1165 Type 2 diabetes mellitus with hyperglycemia: Secondary | ICD-10-CM

## 2012-04-20 DIAGNOSIS — E032 Hypothyroidism due to medicaments and other exogenous substances: Secondary | ICD-10-CM

## 2012-04-20 DIAGNOSIS — I509 Heart failure, unspecified: Secondary | ICD-10-CM

## 2012-04-20 DIAGNOSIS — E118 Type 2 diabetes mellitus with unspecified complications: Secondary | ICD-10-CM

## 2012-04-20 DIAGNOSIS — J159 Unspecified bacterial pneumonia: Secondary | ICD-10-CM

## 2012-04-20 LAB — COMPREHENSIVE METABOLIC PANEL
ALT: 20 U/L (ref 0–53)
BUN: 20 mg/dL (ref 6–23)
Calcium: 8.8 mg/dL (ref 8.4–10.5)
Creatinine, Ser: 1.31 mg/dL (ref 0.50–1.35)
GFR calc Af Amer: 57 mL/min — ABNORMAL LOW (ref >90)
Glucose, Bld: 163 mg/dL — ABNORMAL HIGH (ref 70–99)
Sodium: 140 mEq/L (ref 135–145)
Total Protein: 6.6 g/dL (ref 6.0–8.3)

## 2012-04-20 LAB — GLUCOSE, CAPILLARY
Glucose-Capillary: 150 mg/dL — ABNORMAL HIGH (ref 70–99)
Glucose-Capillary: 204 mg/dL — ABNORMAL HIGH (ref 70–99)
Glucose-Capillary: 283 mg/dL — ABNORMAL HIGH (ref 70–99)

## 2012-04-20 LAB — CBC
Hemoglobin: 12.5 g/dL — ABNORMAL LOW (ref 13.0–17.0)
MCH: 28.8 pg (ref 26.0–34.0)
MCHC: 33.4 g/dL (ref 30.0–36.0)

## 2012-04-20 LAB — CULTURE, RESPIRATORY W GRAM STAIN

## 2012-04-20 MED ORDER — POLYETHYLENE GLYCOL 3350 17 G PO PACK
17.0000 g | PACK | Freq: Once | ORAL | Status: AC
  Administered 2012-04-20: 17 g via ORAL
  Filled 2012-04-20: qty 1

## 2012-04-20 MED ORDER — PREDNISONE 20 MG PO TABS
30.0000 mg | ORAL_TABLET | Freq: Every day | ORAL | Status: DC
  Administered 2012-04-21: 30 mg via ORAL
  Filled 2012-04-20 (×2): qty 1

## 2012-04-20 MED ORDER — POTASSIUM CHLORIDE CRYS ER 20 MEQ PO TBCR
40.0000 meq | EXTENDED_RELEASE_TABLET | Freq: Once | ORAL | Status: AC
  Administered 2012-04-20: 40 meq via ORAL
  Filled 2012-04-20: qty 2

## 2012-04-20 MED ORDER — POTASSIUM CHLORIDE 20 MEQ/15ML (10%) PO LIQD
40.0000 meq | Freq: Once | ORAL | Status: DC
  Filled 2012-04-20: qty 30

## 2012-04-20 MED ORDER — LACTULOSE 10 GM/15ML PO SOLN
20.0000 g | Freq: Once | ORAL | Status: AC
  Administered 2012-04-20: 20 g via ORAL
  Filled 2012-04-20: qty 30

## 2012-04-20 NOTE — Progress Notes (Signed)
CSW was awaiting PT's recommendation.  CSW will discuss SNF placement in the am.  There are barriers due to finances and insurance. CSW will discuss all of these with patient.  Sabino Niemann, MSW, [employer] [phone]

## 2012-04-20 NOTE — Progress Notes (Signed)
SUBJECTIVE Mr Attia feels better today. Less SOB.   1. CHF exacerbation   2. Elevated troponin   3. Community acquired pneumonia     Past Medical History  Diagnosis Date  . Candidiasis of the esophagus 10/26/2007  . Benign neoplasm of esophagus 12/29/2006  . HYPOTHYROIDISM 10/26/2007  . DIABETES MELLITUS, TYPE II 06/11/2007  . HYPERLIPIDEMIA 06/11/2007  . DELUSIONAL DISORDER 06/11/2007  . HYPERTENSION 06/11/2007  . AMI 06/11/2007  . MYOCARDIAL INFARCTION, HX OF 10/26/2007  . CORONARY ARTERY DISEASE 06/11/2007  . SYSTOLIC HEART FAILURE, CHRONIC 01/13/2009  . PERIPHERAL VASCULAR DISEASE 10/26/2007  . ALLERGIC RHINITIS 10/26/2007  . PNEUMONIA, COMMUNITY ACQUIRED, PNEUMOCOCCAL 01/23/2010  . ACUTE GINGIVITIS PLAQUE INDUCED 07/30/2009  . ABSCESS, MOUTH 01/01/2009  . ESOPHAGITIS 06/17/2008  . GERD 06/11/2007  . DIVERTICULOSIS, COLON 10/26/2007  . RENAL INSUFFICIENCY 10/26/2007  . UTI 12/07/2007  . BENIGN PROSTATIC HYPERTROPHY 08/27/2009  . BOILS, RECURRENT 10/02/2008  . Cellulitis and abscess of buttock 09/10/2008  . FOOT ULCER, LEFT 10/27/2007  . BACK PAIN 11/14/2007  . ARTERIOVENOUS MALFORMATION, COLON 10/26/2007  . HYPERSOMNIA 05/30/2008  . FATIGUE 05/30/2008  . PARESTHESIA 05/30/2008  . CHEST PAIN 11/26/2008  . NAUSEA 08/27/2009  . Dysuria 08/27/2009  . URINARY RETENTION 12/07/2007  . PSA, INCREASED 10/26/2007  . 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 Foreign body in esophagus 01/29/2005  . PROSTATE CANCER, HX OF 07/30/2009  . COLONIC POLYPS, HX OF 10/26/2007  . AUTOMATIC IMPLANTABLE CARDIAC DEFIBRILLATOR SITU 05/27/2009  . ICD (implantable cardiac defibrillator) in place   . Pacemaker   . Angina    Current Facility-Administered Medications  Medication Dose Route Frequency Provider Last Rate Last Dose  . 0.9 %  sodium chloride infusion  250 mL Intravenous PRN Belkys A Regalado, MD      . albuterol (PROVENTIL) (5 MG/ML) 0.5% nebulizer solution 2.5 mg  2.5 mg Nebulization Q2H PRN  Belkys A Regalado, MD   2.5 mg at 04/19/12 2105  . atorvastatin (LIPITOR) tablet 40 mg  40 mg Oral q1800 Belkys A Regalado, MD   40 mg at 04/19/12 1733  . carvedilol (COREG) tablet 25 mg  25 mg Oral BID WC Belkys A Regalado, MD   25 mg at 04/20/12 0854  . clopidogrel (PLAVIX) tablet 75 mg  75 mg Oral Daily Belkys A Regalado, MD   75 mg at 04/20/12 1035  . docusate sodium (COLACE) capsule 100 mg  100 mg Oral BID Belkys A Regalado, MD   100 mg at 04/20/12 1035  . finasteride (PROSCAR) tablet 5 mg  5 mg Oral Daily Belkys A Regalado, MD   5 mg at 04/20/12 1035  . furosemide (LASIX) injection 80 mg  80 mg Intravenous BID Gaylord Shih, MD   80 mg at 04/20/12 1034  . insulin aspart (novoLOG) injection 0-9 Units  0-9 Units Subcutaneous TID WC Belkys A Regalado, MD   3 Units at 04/20/12 1157  . insulin glargine (LANTUS) injection 30 Units  30 Units Subcutaneous QHS Belkys A Regalado, MD   30 Units at 04/19/12 2205  . isosorbide dinitrate (ISORDIL) tablet 30 mg  30 mg Oral BID Belkys A Regalado, MD   30 mg at 04/20/12 1035  . lactulose (CHRONULAC) 10 GM/15ML solution 20 g  20 g Oral Once Heavenleigh Petruzzi, MD   20 g at 04/20/12 1035  . levofloxacin (LEVAQUIN) IVPB 750 mg  750 mg Intravenous Q24H Maryanna Shape [place], PHARMD   750 mg at 04/20/12 1413  .  levothyroxine (SYNTHROID, LEVOTHROID) tablet 150 mcg  150 mcg Oral QAC breakfast Belkys A Regalado, MD   150 mcg at 04/20/12 1610  . ondansetron (ZOFRAN) tablet 4 mg  4 mg Oral Q6H PRN Belkys A Regalado, MD       Or  . ondansetron (ZOFRAN) injection 4 mg  4 mg Intravenous Q6H PRN Belkys A Regalado, MD      . pantoprazole (PROTONIX) EC tablet 40 mg  40 mg Oral Q1200 Belkys A Regalado, MD   40 mg at 04/20/12 1157  . polyethylene glycol (MIRALAX / GLYCOLAX) packet 17 g  17 g Oral Once Lennox Leikam, MD   17 g at 04/20/12 1035  . potassium chloride SA (K-DUR,KLOR-CON) CR tablet 20 mEq  20 mEq Oral BID Belkys A Regalado, MD   20 mEq at 04/20/12 1035  . predniSONE  (DELTASONE) tablet 40 mg  40 mg Oral Q breakfast Aiko Belko, MD   40 mg at 04/20/12 0854  . sodium chloride 0.9 % injection 3 mL  3 mL Intravenous Q12H Belkys A Regalado, MD      . sodium chloride 0.9 % injection 3 mL  3 mL Intravenous PRN Belkys A Regalado, MD      . vancomycin (VANCOCIN) 2,000 mg in sodium chloride 0.9 % 500 mL IVPB  2,000 mg Intravenous Once [facility], PHARMD   2,000 mg at 04/19/12 1532  . vancomycin (VANCOCIN) IVPB 1000 mg/200 mL premix  1,000 mg Intravenous Q12H [facility], PHARMD   1,000 mg at 04/20/12 1413  . DISCONTD: sodium chloride 0.9 % injection 3 mL  3 mL Intravenous Q12H Belkys A Regalado, MD   3 mL at 04/20/12 1035   Allergies  Allergen Reactions  . Oxycodone-Acetaminophen Nausea And Vomiting  . Oxycodone-Aspirin Nausea And Vomiting   Active Problems:  HYPOTHYROIDISM  DIABETES MELLITUS, TYPE II  HYPERLIPIDEMIA  HYPERTENSION  CORONARY ARTERY DISEASE  PERIPHERAL VASCULAR DISEASE  PNA (pneumonia)  Heart failure, diastolic, acute   Vital signs in last 24 hours: Temp:  [98 F (36.7 C)-98.7 F (37.1 C)] 98.1 F (36.7 C) (05/23 0852) Pulse Rate:  [56-63] 59  (05/23 1030) Resp:  [20-22] 20  (05/23 1030) BP: (142-179)/(50-98) 142/50 mmHg (05/23 1030) SpO2:  [93 %-100 %] 93 % (05/23 1030) Weight:  [123.424 kg (272 lb 1.6 oz)] 123.424 kg (272 lb 1.6 oz) (05/23 0522) Weight change: -1.476 kg (-3 lb 4.1 oz) Last BM Date: 04/15/12  Intake/Output from previous day: 05/22 0701 - 05/23 0700 In: 1836 [P.O.:720; IV Piggyback:1116] Out: 2300 [Urine:2300] Intake/Output this shift: Total I/O In: 603 [P.O.:600; I.V.:3] Out: 621 [Urine:620; Stool:1]  Lab Results:  Basename 04/20/12 0520 04/19/12 0827  WBC 3.5* 3.6*  HGB 12.5* 12.8*  HCT 37.4* 38.0*  PLT 100* 104*   BMET  Basename 04/20/12 0520 04/19/12 0827  NA 140 139  K 3.6 3.2*  CL 100 99  CO2 33* 30  GLUCOSE 163* 142*  BUN 20 19  CREATININE 1.31 1.21  CALCIUM  8.8 8.5    Studies/Results: No results found.  Medications: I have reviewed the patient's current medications.   Physical exam GENERAL- alert HEAD- normal atraumatic, no neck masses, normal thyroid, no jvd RESPIRATORY- appears well, vitals normal, no respiratory distress, acyanotic, normal RR, ear and throat exam is normal, neck free of mass or lymphadenopathy, chest clear, no wheezing, crepitations, rhonchi, normal symmetric air entry CVS- regular rate and rhythm, S1, S2 normal, no murmur, click, rub or gallop ABDOMEN-  abdomen is soft without significant tenderness, masses, organomegaly or guarding NEURO- Grossly normal EXTREMITIES- no acute changes.  Plan  * PNA (pneumonia)/element of bronchitis- clinically better. Less dyspneic. continue abx to complete 7 days(change to vanc/levaquin). to cover resistant organisms, as immunocompromise, not improving. Plan quick prednisone taper. * Heart failure, diastolic, acute/NSTEMI- Reviewed  2 decho results. EF normal. Appreciate cardiology. Defer mx to cardiology.  * HYPOTHYROIDISM- stable  * DIABETES MELLITUS, TYPE II- controlled. * Hypokalemia- due to diuresis. Replenish,  Magnesium level normal.  * HYPERLIPIDEMIA  HYPERTENSION  CORONARY ARTERY DISEASE  PERIPHERAL VASCULAR DISEASE     Ambriella Kitt 04/20/2012 2:47 PM Pager: [phone].

## 2012-04-20 NOTE — Evaluation (Signed)
Physical Therapy Evaluation Patient Details Name: [name] MRN: [id number] DOB: 03-May-1929 Today's Date: 04/20/2012 Time: 4098-1191 PT Time Calculation (min): 24 min  PT Assessment / Plan / Recommendation Clinical Impression  Pt is an 78 y/o male with old R BKA admitted to hospital for SOB and hypothyroidism. Pt present with decreased activity tolerance. Oxygen saturation dropped to mid 80s on 2L o2 via [place] while ambulating and 79 at rest on room air. Acute PT will follow pt.     PT Assessment  Patient needs continued PT services    Follow Up Recommendations  Other (comment);Supervision - Intermittent (to be determined )    Barriers to Discharge Decreased caregiver support      lEquipment Recommendations  3 in 1 bedside comode    Recommendations for Other Services OT consult   Frequency Min 3X/week    Precautions / Restrictions Precautions Precautions: Fall Required Braces or Orthoses: Other Brace/Splint Other Brace/Splint: Right Prosthetic leg.  (BKA) Restrictions Weight Bearing Restrictions: No   Pertinent Vitals/Pain See flow sheet in chart.       Mobility  Bed Mobility Bed Mobility: Rolling Left;Left Sidelying to Sit Rolling Left: 6: Modified independent (Device/Increase time);With rail Left Sidelying to Sit: 4: Min assist;HOB flat;With rails Details for Bed Mobility Assistance: Pt pulling on my hand to raise shoulders from bed.   Transfers Transfers: Sit to Stand;Stand to Sit;Stand Pivot Transfers Sit to Stand: 5: Supervision;From bed;With upper extremity assist;From elevated surface Stand to Sit: 5: Supervision;To chair/3-in-1;With upper extremity assist Stand Pivot Transfers: 5: Supervision Details for Transfer Assistance: Supervision for safety. Pt demonstrates safe technique.  Ambulation/Gait Ambulation/Gait Assistance: 4: Min guard Ambulation Distance (Feet): 20 Feet Assistive device: 1 person hand held assist Ambulation/Gait Assistance Details: HHA to  stabilze pt. Pt used cane at home PTA Gait Pattern: Within Functional Limits Stairs: No Wheelchair Mobility Wheelchair Mobility: No    Exercises     PT Diagnosis: Generalized weakness  PT Problem List: Decreased activity tolerance;Decreased mobility;Cardiopulmonary status limiting activity PT Treatment Interventions: DME instruction;Gait training;Functional mobility training;Therapeutic activities;Therapeutic exercise;Patient/family education   PT Goals Acute Rehab PT Goals PT Goal Formulation: With patient Time For Goal Achievement: 04/27/12 Potential to Achieve Goals: Good Pt will Ambulate: 16 - 50 feet;with modified independence;with cane PT Goal: Ambulate - Progress: Goal set today  Visit Information  Last PT Received On: 04/20/12    Subjective Data  Subjective: I dont do much walking at home.  Just to the bathroom.  I use my power chair.   Patient Stated Goal: Return to home Independent.    Prior Functioning  Home Living Lives With: Alone Available Help at Discharge: Friend(s);Available PRN/intermittently Type of Home: Apartment Home Access: Ramped entrance Home Layout: One level Bathroom Shower/Tub: Tub/shower unit;Curtain Bathroom Toilet: Standard Bathroom Accessibility: No Home Adaptive Equipment: Shower chair with back;Wheelchair - powered;Straight cane Prior Function Level of Independence: Independent Able to Take Stairs?: No Driving: Yes Vocation: Retired Musician: No difficulties Dominant Hand: Right    Cognition  Overall Cognitive Status: Appears within functional limits for tasks assessed/performed Arousal/Alertness: Awake/alert Orientation Level: Appears intact for tasks assessed;Oriented X4 / Intact Behavior During Session: [facility] for tasks performed    Extremity/Trunk Assessment Right Upper Extremity Assessment RUE ROM/Strength/Tone: Within functional levels Left Upper Extremity Assessment LUE ROM/Strength/Tone: Within  functional levels Right Lower Extremity Assessment RLE ROM/Strength/Tone: Within functional levels (BKA) Left Lower Extremity Assessment LLE ROM/Strength/Tone: WFL for tasks assessed Trunk Assessment Trunk Assessment: Normal   Balance Balance Balance Assessed:  Yes Static Sitting Balance Static Sitting - Balance Support: Feet supported;No upper extremity supported Static Sitting - Level of Assistance: 7: Independent Static Sitting - Comment/# of Minutes: 3+ minutes sitting on EOB no LOB  End of Session PT - End of Session Equipment Utilized During Treatment: Gait belt;Right lower extremity prosthesis;Oxygen Activity Tolerance: Patient limited by fatigue (O2 sats drop to 79 at rest on room air. ) Patient left: in chair;with call bell/phone within reach Nurse Communication: Mobility status;Other (comment) (activity tolerance. )   Adael Culbreath 04/20/2012, 1:18 PM Zahi Plaskett L. Pink Maye DPT [phone]

## 2012-04-20 NOTE — Progress Notes (Signed)
Patient states he has not had BM since Saturday, 04/15/12, MD notified and orders given to administer medications, will continue to monitor patient______________________________________________________D. Manson Passey RN

## 2012-04-21 ENCOUNTER — Inpatient Hospital Stay (HOSPITAL_COMMUNITY): Payer: Medicare Other

## 2012-04-21 DIAGNOSIS — E032 Hypothyroidism due to medicaments and other exogenous substances: Secondary | ICD-10-CM

## 2012-04-21 DIAGNOSIS — E1165 Type 2 diabetes mellitus with hyperglycemia: Secondary | ICD-10-CM

## 2012-04-21 DIAGNOSIS — I509 Heart failure, unspecified: Secondary | ICD-10-CM

## 2012-04-21 DIAGNOSIS — J159 Unspecified bacterial pneumonia: Secondary | ICD-10-CM

## 2012-04-21 DIAGNOSIS — E118 Type 2 diabetes mellitus with unspecified complications: Secondary | ICD-10-CM

## 2012-04-21 DIAGNOSIS — I214 Non-ST elevation (NSTEMI) myocardial infarction: Secondary | ICD-10-CM | POA: Diagnosis present

## 2012-04-21 LAB — CBC
MCH: 28.3 pg (ref 26.0–34.0)
MCHC: 33.3 g/dL (ref 30.0–36.0)
Platelets: 120 10*3/uL — ABNORMAL LOW (ref 150–400)

## 2012-04-21 LAB — COMPREHENSIVE METABOLIC PANEL
ALT: 22 U/L (ref 0–53)
AST: 32 U/L (ref 0–37)
Calcium: 9.1 mg/dL (ref 8.4–10.5)
Creatinine, Ser: 1.03 mg/dL (ref 0.50–1.35)
GFR calc Af Amer: 77 mL/min — ABNORMAL LOW (ref >90)
Sodium: 137 mEq/L (ref 135–145)
Total Protein: 7 g/dL (ref 6.0–8.3)

## 2012-04-21 LAB — GLUCOSE, CAPILLARY
Glucose-Capillary: 180 mg/dL — ABNORMAL HIGH (ref 70–99)
Glucose-Capillary: 248 mg/dL — ABNORMAL HIGH (ref 70–99)

## 2012-04-21 MED ORDER — PREDNISONE 10 MG PO TABS: ORAL_TABLET | ORAL | Status: DC

## 2012-04-21 MED ORDER — FUROSEMIDE 40 MG PO TABS
40.0000 mg | ORAL_TABLET | Freq: Two times a day (BID) | ORAL | Status: DC
  Administered 2012-04-21: 40 mg via ORAL
  Filled 2012-04-21 (×3): qty 1

## 2012-04-21 MED ORDER — LOSARTAN POTASSIUM 50 MG PO TABS
50.0000 mg | ORAL_TABLET | Freq: Every day | ORAL | Status: DC
  Administered 2012-04-21: 50 mg via ORAL
  Filled 2012-04-21: qty 1

## 2012-04-21 MED ORDER — ALBUTEROL SULFATE HFA 108 (90 BASE) MCG/ACT IN AERS: 2.0000 | INHALATION_SPRAY | Freq: Four times a day (QID) | RESPIRATORY_TRACT | Status: DC | PRN

## 2012-04-21 MED ORDER — LEVOFLOXACIN 750 MG PO TABS: 750.0000 mg | ORAL_TABLET | Freq: Every day | ORAL | Status: AC

## 2012-04-21 NOTE — Discharge Summary (Signed)
DISCHARGE SUMMARY  [name]  [name]#: [id number]  DOB:[birth year]  Date of Admission: 04/18/2012 Date of Discharge: 04/21/2012  Attending Physician:Lerlene Treadwell  Patient's ONG:EXBMW [name] Ruiz, MD, MD  Consults:Treatment Team:  Dory Peru Lbcardiology, MD  Discharge Diagnoses: Present on Admission:  .HYPOTHYROIDISM .DIABETES MELLITUS, TYPE II .HYPERLIPIDEMIA .HYPERTENSION .CORONARY ARTERY DISEASE .PERIPHERAL VASCULAR DISEASE .PNA (pneumonia) .Heart failure, diastolic, acute .NSTEMI (non-ST elevated myocardial infarction)  Hospital Course: [name] is an 78 year old gentleman admitted with worsening shortness of breath and cough. Cardiac enzymes were positive but cardiology felt this was due to acute on chronic diastolic chf decompensation. An echo showed EF of 60-65% without wall motion abnormalities. He was treated for CAP with element of COPD exacerbation. [name] should complete 5 more days of levaquin. He feels much better today, and is eager to go home. I have encouraged him to follow with cardiology and his PCP in the next 1-2 weeks. He is discharged in stable condition.   Medication List  As of 04/21/2012 11:49 AM   STOP taking these medications         cephALEXin 500 MG capsule         TAKE these medications         albuterol 108 (90 BASE) MCG/ACT inhaler   Commonly known as: PROVENTIL HFA;VENTOLIN HFA   Inhale 2 puffs into the lungs every 6 (six) hours as needed for wheezing.      carvedilol 25 MG tablet   Commonly known as: COREG   Take 1 tablet (25 mg total) by mouth 2 (two) times daily with a meal.      clopidogrel 75 MG tablet   Commonly known as: PLAVIX   Take 1 tablet (75 mg total) by mouth daily.      finasteride 5 MG tablet   Commonly known as: PROSCAR   Take 1 tablet (5 mg total) by mouth daily.      furosemide 40 MG tablet   Commonly known as: LASIX   Take 40-80 mg by mouth 2 (two) times daily. Take 2 tabs in the morning and 1 tab at night     glipiZIDE 10 MG 24 hr tablet   Commonly known as: GLUCOTROL XL   Take 10 mg by mouth daily.      insulin glargine 100 UNIT/ML injection   Commonly known as: LANTUS   Inject 60 Units into the skin at bedtime.      irbesartan 300 MG tablet   Commonly known as: AVAPRO   Take 1 tablet (300 mg total) by mouth daily.      isosorbide dinitrate 30 MG tablet   Commonly known as: ISORDIL   Take 1 tablet (30 mg total) by mouth 2 (two) times daily.      levofloxacin 750 MG tablet   Commonly known as: LEVAQUIN   Take 1 tablet (750 mg total) by mouth daily.      levothyroxine 150 MCG tablet   Commonly known as: SYNTHROID, LEVOTHROID   Take 1 tablet (150 mcg total) by mouth daily.      omeprazole 20 MG capsule   Commonly known as: PRILOSEC   Take 1 capsule (20 mg total) by mouth daily.      potassium chloride SA 20 MEQ tablet   Commonly known as: K-DUR,KLOR-CON   Take 20-40 mEq by mouth 2 (two) times daily. Take 2 tabs in the morning and 1 tab at night      predniSONE 10 MG tablet   Commonly  known as: DELTASONE   Take 3 tablets daily for 2 days, then 2 tablets daily for 2 days, then 1 tablet daily for 2 days.      simvastatin 80 MG tablet   Commonly known as: ZOCOR   Take 1 tablet (80 mg total) by mouth at bedtime.             Day of Discharge BP 164/67  Pulse 74  Temp(Src) 97.7 F (36.5 C) (Oral)  Resp 20  Ht 6\' 2"  (1.88 m)  Wt 120.1 kg (264 lb 12.4 oz)  BMI 33.99 kg/m2  SpO2 94%  Physical Exam: Comfortable at rest.  Results for orders placed during the hospital encounter of 04/18/12 (from the past 24 hour(s))  GLUCOSE, CAPILLARY     Status: Abnormal   Collection Time   04/20/12  4:13 PM      Component Value Range   Glucose-Capillary 283 (*) 70 - 99 (mg/dL)   Comment 1 Notify RN    GLUCOSE, CAPILLARY     Status: Abnormal   Collection Time   04/20/12  9:57 PM      Component Value Range   Glucose-Capillary 269 (*) 70 - 99 (mg/dL)   Comment 1 Notify RN    CBC      Status: Abnormal   Collection Time   04/21/12  6:17 AM      Component Value Range   WBC 4.3  4.0 - 10.5 (K/uL)   RBC 4.63  4.22 - 5.81 (MIL/uL)   Hemoglobin 13.1  13.0 - 17.0 (g/dL)   HCT 16.1  09.6 - 04.5 (%)   MCV 84.9  78.0 - 100.0 (fL)   MCH 28.3  26.0 - 34.0 (pg)   MCHC 33.3  30.0 - 36.0 (g/dL)   RDW 40.9  81.1 - 91.4 (%)   Platelets 120 (*) 150 - 400 (K/uL)  COMPREHENSIVE METABOLIC PANEL     Status: Abnormal   Collection Time   04/21/12  6:17 AM      Component Value Range   Sodium 137  135 - 145 (mEq/L)   Potassium 3.9  3.5 - 5.1 (mEq/L)   Chloride 99  96 - 112 (mEq/L)   CO2 28  19 - 32 (mEq/L)   Glucose, Bld 215 (*) 70 - 99 (mg/dL)   BUN 18  6 - 23 (mg/dL)   Creatinine, Ser 7.82  0.50 - 1.35 (mg/dL)   Calcium 9.1  8.4 - 95.6 (mg/dL)   Total Protein 7.0  6.0 - 8.3 (g/dL)   Albumin 2.9 (*) 3.5 - 5.2 (g/dL)   AST 32  0 - 37 (U/L)   ALT 22  0 - 53 (U/L)   Alkaline Phosphatase 52  39 - 117 (U/L)   Total Bilirubin 0.5  0.3 - 1.2 (mg/dL)   GFR calc non Af Amer 66 (*) >90 (mL/min)   GFR calc Af Amer 77 (*) >90 (mL/min)  GLUCOSE, CAPILLARY     Status: Abnormal   Collection Time   04/21/12  6:24 AM      Component Value Range   Glucose-Capillary 180 (*) 70 - 99 (mg/dL)   Comment 1 Notify RN    GLUCOSE, CAPILLARY     Status: Abnormal   Collection Time   04/21/12 11:21 AM      Component Value Range   Glucose-Capillary 248 (*) 70 - 99 (mg/dL)   Comment 1 Notify RN      Disposition: home with [facility].   Follow-up Appts:  Discharge Orders    Future Orders Please Complete By Expires   Diet - low sodium heart healthy      Increase activity slowly           Tests Needing Follow-up: BMP in 1 week. Time spent in discharge (includes decision making & examination of pt): 25 minutes  Signed: Drequan Ironside 04/21/2012, 11:49 AM

## 2012-04-21 NOTE — Progress Notes (Signed)
Clinical Social Work Department BRIEF PSYCHOSOCIAL ASSESSMENT 04/21/2012  Patient:  [name], [name]     Account Number:  [id number]     Admit date:  04/18/2012  Clinical Social Worker:  Juliette Mangle  Date/Time:  04/21/2012 12:00 N  Referred by:  Care Management  Date Referred:  04/19/2012 Referred for  SNF Placement   Other Referral:   Interview type:  Patient Other interview type:    PSYCHOSOCIAL DATA Living Status:  ALONE Admitted from facility:   Level of care:   Primary support name:  Murphy,Margaret Primary support relationship to patient:  FAMILY Degree of support available:   Fair    CURRENT CONCERNS Current Concerns  Post-Acute Placement   Other Concerns:    SOCIAL WORK ASSESSMENT / PLAN CSW received a referral for SNF placement. CSW was awaiting PT evaluation before meeting with the patient. PT evaluation recommended supervision and HH. CSW met with patient at bedside and introduced self, explained role and discussed possible SNF placement as an option. Patient reported that he was not interested in SNF placement. Patient was agreeable to Home Health with [facility]. CSW spoke with MD and also felt SNF was not appropriate.  CSW screened for depression and no depression was reported.  Patient identified his niece as a good support person. Patient's needs seem to have been met. CSW will sign off for now as Social Work intervention is no longer needed   Assessment/plan status:  No Further Intervention Required Other assessment/ plan:   Information/referral to community resources:    PATIENT'S/FAMILY'S RESPONSE TO PLAN OF CARE: Patient was receptive and appreciative of support and information provided by CSW. CSW will sign off.     Sabino Niemann, MSW, [employer] [phone]

## 2012-04-21 NOTE — Progress Notes (Signed)
Subjective: No CP  Breathing is OK Objective: Filed Vitals:   04/20/12 1448 04/20/12 1700 04/20/12 2055 04/21/12 0447  BP: 138/59 174/77 181/85 167/70  Pulse: 64 63 64 63  Temp: 97.7 F (36.5 C)  97.3 F (36.3 C) 97.8 F (36.6 C)  TempSrc: Oral  Oral Oral  Resp: 20 20 22 20   Height:      Weight:    264 lb 12.4 oz (120.1 kg)  SpO2: 99% 98% 97% 100%   Weight change: -7 lb 5.2 oz (-3.324 kg)  Intake/Output Summary (Last 24 hours) at 04/21/12 0918 Last data filed at 04/21/12 0809  Gross per 24 hour  Intake   1783 ml  Output   1951 ml  Net   -168 ml    General: Alert, awake, oriented x3, in no acute distress Heart: Regular rate and rhythm, without murmurs, rubs, gallops.  Lungs: Clear to auscultation.  No rales or wheezes. Exemities:  No edema.   Neuro: Grossly intact, nonfocal.   Lab Results: Results for orders placed during the hospital encounter of 04/18/12 (from the past 24 hour(s))  GLUCOSE, CAPILLARY     Status: Abnormal   Collection Time   04/20/12 11:03 AM      Component Value Range   Glucose-Capillary 204 (*) 70 - 99 (mg/dL)   Comment 1 Notify RN    GLUCOSE, CAPILLARY     Status: Abnormal   Collection Time   04/20/12  4:13 PM      Component Value Range   Glucose-Capillary 283 (*) 70 - 99 (mg/dL)   Comment 1 Notify RN    GLUCOSE, CAPILLARY     Status: Abnormal   Collection Time   04/20/12  9:57 PM      Component Value Range   Glucose-Capillary 269 (*) 70 - 99 (mg/dL)   Comment 1 Notify RN    CBC     Status: Abnormal   Collection Time   04/21/12  6:17 AM      Component Value Range   WBC 4.3  4.0 - 10.5 (K/uL)   RBC 4.63  4.22 - 5.81 (MIL/uL)   Hemoglobin 13.1  13.0 - 17.0 (g/dL)   HCT 16.1  09.6 - 04.5 (%)   MCV 84.9  78.0 - 100.0 (fL)   MCH 28.3  26.0 - 34.0 (pg)   MCHC 33.3  30.0 - 36.0 (g/dL)   RDW 40.9  81.1 - 91.4 (%)   Platelets 120 (*) 150 - 400 (K/uL)  COMPREHENSIVE METABOLIC PANEL     Status: Abnormal   Collection Time   04/21/12  6:17 AM     Component Value Range   Sodium 137  135 - 145 (mEq/L)   Potassium 3.9  3.5 - 5.1 (mEq/L)   Chloride 99  96 - 112 (mEq/L)   CO2 28  19 - 32 (mEq/L)   Glucose, Bld 215 (*) 70 - 99 (mg/dL)   BUN 18  6 - 23 (mg/dL)   Creatinine, Ser 7.82  0.50 - 1.35 (mg/dL)   Calcium 9.1  8.4 - 95.6 (mg/dL)   Total Protein 7.0  6.0 - 8.3 (g/dL)   Albumin 2.9 (*) 3.5 - 5.2 (g/dL)   AST 32  0 - 37 (U/L)   ALT 22  0 - 53 (U/L)   Alkaline Phosphatase 52  39 - 117 (U/L)   Total Bilirubin 0.5  0.3 - 1.2 (mg/dL)   GFR calc non Af Amer 66 (*) >90 (mL/min)   GFR  calc Af Amer 77 (*) >90 (mL/min)  GLUCOSE, CAPILLARY     Status: Abnormal   Collection Time   04/21/12  6:24 AM      Component Value Range   Glucose-Capillary 180 (*) 70 - 99 (mg/dL)   Comment 1 Notify RN      Studies/Results: No results found.  Medications: I have reviewed the patient's current medications.   Patient Active Hospital Problem List: HYPERLIPIDEMIA (06/11/2007)   Assessment: Keep on lipitor    HYPERTENION (06/11/2007)   Assessment: BP is out of control  COnsult note suggests that he was on irbesartan.  Not on ARB now.  I would recomm Cozaar 50 to start.  Titrate and follow K, Cr, BP.   CORONARY ARTERY DISEASE (06/11/2007)   Assessment: No signs to suggest active ischemia.    PNA (pneumonia) (04/18/2012)   Assessment: ABX  Heart failure, diastolic, acute (1/61/0960)   Assessment: Echo with normal LV systolic function.   COntinue diuresis.  Check BNP.  Ques if I/O accurate.  Clinically improved.  PO .Marland Kitchen  Again, wak K if adding ARB.  Will prob need to back down on  K supp.   LOS: 3 days   Dietrich Pates 04/21/2012, 9:18 AM

## 2012-04-21 NOTE — Progress Notes (Signed)
Patient's tele and IV has been discontinued; patient verbalizes understanding of discharge instructions______________________________________D. Manson Passey RN

## 2012-04-21 NOTE — Plan of Care (Signed)
Problem: Food- and Nutrition-Related Knowledge Deficit (NB-1.1) Goal: Nutrition education Formal process to instruct or train a patient/client in a skill or to impart knowledge to help patients/clients voluntarily manage or modify food choices and eating behavior to maintain or improve health.  Outcome: Completed/Met Date Met:  04/21/12 RD consulted for nutrition education on low salt diet. Per pt's food recall pt is eating out for dinner often at places such as [employer] and [employer] and was unaware that the foods were high in salt. "they dont taste salty" Pt also eating high salt foods at home, liver pudding, daily. RD educated pt on common high salt foods and ways to avoid them. Pt was given low sodium hand out to review. Pt did not seem interested in education or making changes at this time. RD encouraged to follow diet for better health. RD does not expect good compliance. Pt is being followed by RD, will continue to follow per care plan.   [name]

## 2012-04-26 ENCOUNTER — Encounter: Payer: Self-pay | Admitting: Internal Medicine

## 2012-04-26 ENCOUNTER — Ambulatory Visit (INDEPENDENT_AMBULATORY_CARE_PROVIDER_SITE_OTHER): Payer: Medicare Other | Admitting: Internal Medicine

## 2012-04-26 VITALS — BP 112/72 | HR 58 | Temp 97.6°F | Ht 74.0 in | Wt 264.0 lb

## 2012-04-26 DIAGNOSIS — Z0000 Encounter for general adult medical examination without abnormal findings: Secondary | ICD-10-CM

## 2012-04-26 DIAGNOSIS — E119 Type 2 diabetes mellitus without complications: Secondary | ICD-10-CM

## 2012-04-26 MED ORDER — CLOPIDOGREL BISULFATE 75 MG PO TABS: 75.0000 mg | ORAL_TABLET | Freq: Every day | ORAL | Status: AC

## 2012-04-26 MED ORDER — OMEPRAZOLE 20 MG PO CPDR: 20.0000 mg | DELAYED_RELEASE_CAPSULE | Freq: Every day | ORAL | Status: AC

## 2012-04-26 MED ORDER — POTASSIUM CHLORIDE CRYS ER 20 MEQ PO TBCR: 20.0000 meq | EXTENDED_RELEASE_TABLET | Freq: Two times a day (BID) | ORAL | Status: AC

## 2012-04-26 MED ORDER — ALBUTEROL SULFATE HFA 108 (90 BASE) MCG/ACT IN AERS: 2.0000 | INHALATION_SPRAY | Freq: Four times a day (QID) | RESPIRATORY_TRACT | Status: AC | PRN

## 2012-04-26 MED ORDER — FINASTERIDE 5 MG PO TABS: 5.0000 mg | ORAL_TABLET | Freq: Every day | ORAL | Status: AC

## 2012-04-26 MED ORDER — FUROSEMIDE 40 MG PO TABS: 40.0000 mg | ORAL_TABLET | Freq: Two times a day (BID) | ORAL | Status: AC

## 2012-04-26 MED ORDER — INSULIN GLARGINE 100 UNIT/ML ~~LOC~~ SOLN: 70.0000 [IU] | Freq: Every day | SUBCUTANEOUS | Status: AC

## 2012-04-26 MED ORDER — SIMVASTATIN 80 MG PO TABS: 80.0000 mg | ORAL_TABLET | Freq: Every day | ORAL | Status: AC

## 2012-04-26 MED ORDER — GLIPIZIDE ER 10 MG PO TB24: 10.0000 mg | ORAL_TABLET | Freq: Every day | ORAL | Status: AC

## 2012-04-26 MED ORDER — LEVOTHYROXINE SODIUM 150 MCG PO TABS: 150.0000 ug | ORAL_TABLET | Freq: Every day | ORAL | Status: AC

## 2012-04-26 MED ORDER — ISOSORBIDE DINITRATE 30 MG PO TABS: 30.0000 mg | ORAL_TABLET | Freq: Two times a day (BID) | ORAL | Status: AC

## 2012-04-26 MED ORDER — CARVEDILOL 25 MG PO TABS: 25.0000 mg | ORAL_TABLET | Freq: Two times a day (BID) | ORAL | Status: AC

## 2012-04-26 MED ORDER — IRBESARTAN 300 MG PO TABS: 300.0000 mg | ORAL_TABLET | Freq: Every day | ORAL | Status: AC

## 2012-04-26 NOTE — Patient Instructions (Signed)
Please increase your lantus to 70 units per day Continue all other medications as before All of your medications were refilled as of today's date Please finish the levaquiin and prednisone Please have the pharmacy call if you need a talking glucometer (need a name) Please return in 3 mo with Lab testing done 3-5 days before

## 2012-04-30 ENCOUNTER — Encounter: Payer: Self-pay | Admitting: Internal Medicine

## 2012-04-30 NOTE — Assessment & Plan Note (Signed)

## 2012-04-30 NOTE — Assessment & Plan Note (Signed)
Recent a1c 9.1  On may 21 - to incr the lantus to 70 units Lab Results  Component Value Date   HGBA1C 9.1* 04/18/2012
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
## 2012-05-04 DIAGNOSIS — I2510 Atherosclerotic heart disease of native coronary artery without angina pectoris: Secondary | ICD-10-CM

## 2012-05-04 DIAGNOSIS — I5031 Acute diastolic (congestive) heart failure: Secondary | ICD-10-CM

## 2012-05-04 DIAGNOSIS — J189 Pneumonia, unspecified organism: Secondary | ICD-10-CM

## 2012-05-04 DIAGNOSIS — I509 Heart failure, unspecified: Secondary | ICD-10-CM

## 2012-05-09 ENCOUNTER — Telehealth: Payer: Self-pay

## 2012-05-09 NOTE — Telephone Encounter (Signed)
Called [facility] left detailed message of verbal ok.

## 2012-05-09 NOTE — Telephone Encounter (Signed)
[facility] would like a verbal to continue to see the patient.  Verbal is ok. Call back number is [phone]

## 2012-05-09 NOTE — Telephone Encounter (Signed)
Ok for verbal 

## 2012-07-11 ENCOUNTER — Telehealth: Payer: Self-pay

## 2012-07-11 NOTE — Telephone Encounter (Signed)
Ok for 2 wk sample of lantus or levemir if available  Also to refer to [facility]

## 2012-07-11 NOTE — Telephone Encounter (Signed)
Called the patient gave him 4 boxes levemir to be picked up in the morning 07/12/12.  Also referred the patient to [facility] per MD instructions.  Sylvester Harder patient assistance form to complete.

## 2012-07-11 NOTE — Telephone Encounter (Signed)
The patient took his last lantus injection last night.  He is not out of insulin and has no money to get any, please advise as is completely out.

## 2012-11-01 ENCOUNTER — Encounter (HOSPITAL_COMMUNITY): Payer: Self-pay | Admitting: *Deleted

## 2012-11-01 ENCOUNTER — Emergency Department (HOSPITAL_COMMUNITY)
Admission: EM | Admit: 2012-11-01 | Discharge: 2012-11-03 | Disposition: A | Discharge status: Other Acute Inpatient Hospital | Payer: Medicare Other | Attending: Emergency Medicine | Admitting: Emergency Medicine

## 2012-11-01 DIAGNOSIS — F22 Delusional disorders: Secondary | ICD-10-CM

## 2012-11-01 DIAGNOSIS — Z8719 Personal history of other diseases of the digestive system: Secondary | ICD-10-CM | POA: Insufficient documentation

## 2012-11-01 DIAGNOSIS — E039 Hypothyroidism, unspecified: Secondary | ICD-10-CM | POA: Insufficient documentation

## 2012-11-01 DIAGNOSIS — Z872 Personal history of diseases of the skin and subcutaneous tissue: Secondary | ICD-10-CM | POA: Insufficient documentation

## 2012-11-01 DIAGNOSIS — Z8701 Personal history of pneumonia (recurrent): Secondary | ICD-10-CM | POA: Insufficient documentation

## 2012-11-01 DIAGNOSIS — Z8679 Personal history of other diseases of the circulatory system: Secondary | ICD-10-CM | POA: Insufficient documentation

## 2012-11-01 DIAGNOSIS — Z87828 Personal history of other (healed) physical injury and trauma: Secondary | ICD-10-CM | POA: Insufficient documentation

## 2012-11-01 DIAGNOSIS — E119 Type 2 diabetes mellitus without complications: Secondary | ICD-10-CM | POA: Insufficient documentation

## 2012-11-01 DIAGNOSIS — I252 Old myocardial infarction: Secondary | ICD-10-CM | POA: Insufficient documentation

## 2012-11-01 DIAGNOSIS — Z8546 Personal history of malignant neoplasm of prostate: Secondary | ICD-10-CM | POA: Insufficient documentation

## 2012-11-01 DIAGNOSIS — Z95810 Presence of automatic (implantable) cardiac defibrillator: Secondary | ICD-10-CM | POA: Insufficient documentation

## 2012-11-01 DIAGNOSIS — K219 Gastro-esophageal reflux disease without esophagitis: Secondary | ICD-10-CM | POA: Insufficient documentation

## 2012-11-01 DIAGNOSIS — Z87448 Personal history of other diseases of urinary system: Secondary | ICD-10-CM | POA: Insufficient documentation

## 2012-11-01 DIAGNOSIS — Z794 Long term (current) use of insulin: Secondary | ICD-10-CM | POA: Insufficient documentation

## 2012-11-01 DIAGNOSIS — Z8669 Personal history of other diseases of the nervous system and sense organs: Secondary | ICD-10-CM | POA: Insufficient documentation

## 2012-11-01 DIAGNOSIS — Z87440 Personal history of urinary (tract) infections: Secondary | ICD-10-CM | POA: Insufficient documentation

## 2012-11-01 DIAGNOSIS — E785 Hyperlipidemia, unspecified: Secondary | ICD-10-CM | POA: Insufficient documentation

## 2012-11-01 DIAGNOSIS — Z860100 Personal history of colon polyps, unspecified: Secondary | ICD-10-CM | POA: Insufficient documentation

## 2012-11-01 DIAGNOSIS — Z79899 Other long term (current) drug therapy: Secondary | ICD-10-CM | POA: Insufficient documentation

## 2012-11-01 DIAGNOSIS — Z8739 Personal history of other diseases of the musculoskeletal system and connective tissue: Secondary | ICD-10-CM | POA: Insufficient documentation

## 2012-11-01 DIAGNOSIS — I10 Essential (primary) hypertension: Secondary | ICD-10-CM | POA: Insufficient documentation

## 2012-11-01 NOTE — ED Notes (Signed)
Pt sts he is here tonight because "magistrate think I need help with my mental health. I don't know who magistrate is, he's never seen me and I've never seen him, but he thinks I need help. Go figure.". Pt is calm, cooperative and oriented x4, VSS

## 2012-11-01 NOTE — ED Notes (Signed)
Pt presents to ed via [facility], with police escort with c/o medical clearance. Per EMS report pt is here for IVC which was taken out by secret service. Per Secret Service Agent pt has been sending out letters threatening president, police detectives and others. Pt is known to secret service for doing same in past. Per EMS pt was uncooperative on their arrival and they were not able to get vital signs.
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
## 2012-11-02 ENCOUNTER — Emergency Department (HOSPITAL_COMMUNITY): Payer: Medicare Other

## 2012-11-02 LAB — COMPREHENSIVE METABOLIC PANEL
ALT: 15 U/L (ref 0–53)
Albumin: 3.2 g/dL — ABNORMAL LOW (ref 3.5–5.2)
Alkaline Phosphatase: 66 U/L (ref 39–117)
Chloride: 99 mEq/L (ref 96–112)
GFR calc Af Amer: 58 mL/min — ABNORMAL LOW (ref >90)
Glucose, Bld: 276 mg/dL — ABNORMAL HIGH (ref 70–99)
Potassium: 4 mEq/L (ref 3.5–5.1)
Sodium: 138 mEq/L (ref 135–145)
Total Bilirubin: 0.3 mg/dL (ref 0.3–1.2)
Total Protein: 7 g/dL (ref 6.0–8.3)

## 2012-11-02 LAB — CBC WITH DIFFERENTIAL/PLATELET
Eosinophils Absolute: 0.1 10*3/uL (ref 0.0–0.7)
Hemoglobin: 13 g/dL (ref 13.0–17.0)
Lymphocytes Relative: 28 % (ref 12–46)
Lymphs Abs: 1.2 10*3/uL (ref 0.7–4.0)
MCH: 29.5 pg (ref 26.0–34.0)
Neutro Abs: 2.6 10*3/uL (ref 1.7–7.7)
Neutrophils Relative %: 60 % (ref 43–77)
Platelets: 129 10*3/uL — ABNORMAL LOW (ref 150–400)
RBC: 4.41 MIL/uL (ref 4.22–5.81)
WBC: 4.3 10*3/uL (ref 4.0–10.5)

## 2012-11-02 LAB — GLUCOSE, CAPILLARY: Glucose-Capillary: 184 mg/dL — ABNORMAL HIGH (ref 70–99)

## 2012-11-02 LAB — ETHANOL: Alcohol, Ethyl (B): <11 mg/dL (ref 0–11)

## 2012-11-02 LAB — RAPID URINE DRUG SCREEN, HOSP PERFORMED
Amphetamines: NOT DETECTED
Tetrahydrocannabinol: NOT DETECTED

## 2012-11-02 MED ORDER — RISPERIDONE 0.5 MG PO TABS
0.5000 mg | ORAL_TABLET | Freq: Every day | ORAL | Status: DC
  Filled 2012-11-02: qty 1

## 2012-11-02 MED ORDER — FUROSEMIDE 40 MG PO TABS
40.0000 mg | ORAL_TABLET | Freq: Every day | ORAL | Status: DC
  Administered 2012-11-02 – 2012-11-03 (×2): 40 mg via ORAL
  Filled 2012-11-02 (×2): qty 1

## 2012-11-02 MED ORDER — POTASSIUM CHLORIDE CRYS ER 20 MEQ PO TBCR: 20.0000 meq | EXTENDED_RELEASE_TABLET | Freq: Two times a day (BID) | ORAL | Status: DC

## 2012-11-02 MED ORDER — POTASSIUM CHLORIDE CRYS ER 20 MEQ PO TBCR
40.0000 meq | EXTENDED_RELEASE_TABLET | Freq: Every day | ORAL | Status: DC
  Administered 2012-11-02 – 2012-11-03 (×2): 40 meq via ORAL
  Filled 2012-11-02 (×3): qty 2

## 2012-11-02 MED ORDER — ALBUTEROL SULFATE HFA 108 (90 BASE) MCG/ACT IN AERS: 2.0000 | INHALATION_SPRAY | Freq: Four times a day (QID) | RESPIRATORY_TRACT | Status: DC | PRN

## 2012-11-02 MED ORDER — FUROSEMIDE 80 MG PO TABS
80.0000 mg | ORAL_TABLET | Freq: Every day | ORAL | Status: DC
  Administered 2012-11-02 – 2012-11-03 (×2): 80 mg via ORAL
  Filled 2012-11-02 (×3): qty 2

## 2012-11-02 MED ORDER — LORAZEPAM 1 MG PO TABS: 1.0000 mg | ORAL_TABLET | Freq: Three times a day (TID) | ORAL | Status: DC | PRN

## 2012-11-02 MED ORDER — IRBESARTAN 300 MG PO TABS
300.0000 mg | ORAL_TABLET | Freq: Every day | ORAL | Status: DC
  Administered 2012-11-02 – 2012-11-03 (×2): 300 mg via ORAL
  Filled 2012-11-02 (×2): qty 1

## 2012-11-02 MED ORDER — PANTOPRAZOLE SODIUM 40 MG PO TBEC
40.0000 mg | DELAYED_RELEASE_TABLET | Freq: Every day | ORAL | Status: DC
  Administered 2012-11-02 – 2012-11-03 (×2): 40 mg via ORAL
  Filled 2012-11-02 (×2): qty 1

## 2012-11-02 MED ORDER — CLOPIDOGREL BISULFATE 75 MG PO TABS
75.0000 mg | ORAL_TABLET | Freq: Every day | ORAL | Status: DC
  Administered 2012-11-02 – 2012-11-03 (×2): 75 mg via ORAL
  Filled 2012-11-02 (×2): qty 1

## 2012-11-02 MED ORDER — LEVOTHYROXINE SODIUM 150 MCG PO TABS
150.0000 ug | ORAL_TABLET | Freq: Every day | ORAL | Status: DC
  Administered 2012-11-02 – 2012-11-03 (×2): 150 ug via ORAL
  Filled 2012-11-02 (×3): qty 1

## 2012-11-02 MED ORDER — INSULIN GLARGINE 100 UNIT/ML ~~LOC~~ SOLN
70.0000 [IU] | Freq: Every day | SUBCUTANEOUS | Status: DC
  Administered 2012-11-02 (×2): 70 [IU] via SUBCUTANEOUS
  Filled 2012-11-02 (×2): qty 1

## 2012-11-02 MED ORDER — CARVEDILOL 25 MG PO TABS
25.0000 mg | ORAL_TABLET | Freq: Two times a day (BID) | ORAL | Status: DC
  Administered 2012-11-02 – 2012-11-03 (×4): 25 mg via ORAL
  Filled 2012-11-02 (×5): qty 1

## 2012-11-02 MED ORDER — ISOSORBIDE DINITRATE 30 MG PO TABS
30.0000 mg | ORAL_TABLET | Freq: Two times a day (BID) | ORAL | Status: DC
  Administered 2012-11-02 – 2012-11-03 (×4): 30 mg via ORAL
  Filled 2012-11-02 (×5): qty 1

## 2012-11-02 MED ORDER — POTASSIUM CHLORIDE CRYS ER 20 MEQ PO TBCR
20.0000 meq | EXTENDED_RELEASE_TABLET | Freq: Every day | ORAL | Status: DC
  Administered 2012-11-02 (×2): 20 meq via ORAL
  Filled 2012-11-02: qty 1

## 2012-11-02 MED ORDER — ATORVASTATIN CALCIUM 40 MG PO TABS
40.0000 mg | ORAL_TABLET | Freq: Every day | ORAL | Status: DC
  Administered 2012-11-02 – 2012-11-03 (×2): 40 mg via ORAL
  Filled 2012-11-02 (×2): qty 1

## 2012-11-02 MED ORDER — FINASTERIDE 5 MG PO TABS
5.0000 mg | ORAL_TABLET | Freq: Every day | ORAL | Status: DC
  Administered 2012-11-02 – 2012-11-03 (×2): 5 mg via ORAL
  Filled 2012-11-02 (×2): qty 1

## 2012-11-02 MED ORDER — GLIPIZIDE ER 10 MG PO TB24
10.0000 mg | ORAL_TABLET | Freq: Every day | ORAL | Status: DC
  Administered 2012-11-02 – 2012-11-03 (×2): 10 mg via ORAL
  Filled 2012-11-02 (×3): qty 1
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
## 2012-11-02 NOTE — ED Notes (Signed)
Pt changed into gown and belongings, clothes and blue pouch placed in locker 33.

## 2012-11-02 NOTE — BHH Counselor (Signed)
Patient referred to the following:[facility], [place], [place], [place], [facility], [place], [address], [place], [place], [place], [place], [place], [facility], and [facility]. Patient's labs, clinicals, [place], etc was faxed to the above facilities for review as of 11/02/12 @ 1938.

## 2012-11-02 NOTE — BHH Counselor (Signed)
Received call from staff Marcelino Duster) at [facility] requesting IVC. Checked chart to review IVC and First Examination was not completed; Clinical research associate completed the appropriate paperwork and faxed all documents to [employer].   Marcelino Duster also asked for details on patients ADL's due to patients below the knee amputation. Per patient's nurse, he  independent. Pt also has a prosthetic leg and lives alone.   Elon Jester also inquired about patients medical issues. Per patients nurse, he has chronic medical issues such as HTN, Diabetes, Thyroid issues, and takes Potassium b/c he is on Lasix.   Contacted Michele at  [employer] to update her with the above information, however; she was unavailable. Left a message with staff-Cici asking that she has Marcelino Duster call [facility] staff back to obtain requested information.

## 2012-11-02 NOTE — ED Notes (Signed)
Telepsych is complete.

## 2012-11-02 NOTE — ED Provider Notes (Addendum)
0700.  Pt sleep, NAD.  Note stable VS.  No acute events reported.  Pt is awaiting eval by the ACT Team and telepsych provider.  Will review the recommendations as available.  Tobin Chad, MD 11/02/12 1191  1215.  Pt has been evaluated by Dr. Berlin Hun (relepsych).  She recommends inpt placement but feels requests a head CT as his paranoia may be secondary to dementia.  Will review the results a s available and respond appropriately to any abnormal findings.  Will also start risperdal per Dr. Solon Palm recommendations.  Tobin Chad, MD 11/02/12 [phone]

## 2012-11-03 LAB — URINALYSIS, ROUTINE W REFLEX MICROSCOPIC
Bilirubin Urine: NEGATIVE
Glucose, UA: NEGATIVE mg/dL
Ketones, ur: NEGATIVE mg/dL
Specific Gravity, Urine: 1.016 (ref 1.005–1.030)
pH: 6.5 (ref 5.0–8.0)

## 2012-11-03 LAB — URINE MICROSCOPIC-ADD ON

## 2012-11-03 LAB — GLUCOSE, CAPILLARY: Glucose-Capillary: 163 mg/dL — ABNORMAL HIGH (ref 70–99)

## 2012-11-03 NOTE — Progress Notes (Signed)
Late entry for 11/03/12 1430 CM assisted pt to received call from "chastity"

## 2012-11-03 NOTE — ED Notes (Signed)
ACT in w/w pt

## 2012-11-03 NOTE — BHH Counselor (Signed)
Patient accepted to [facility] by Dr. Norlene Duel. Call report # [phone]. Pt is under IVC and pending transport via sheriff.

## 2012-11-03 NOTE — ED Notes (Signed)
Secret service updated of pt disposition.

## 2012-11-03 NOTE — ED Notes (Signed)
Sheriff contacted and is aware that the pt will have to transport by PTA.  He will call back later to coordinate
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
## 2012-11-03 NOTE — ED Notes (Signed)
Pt reports he does not understand why he is here.  Pt reports that this is being done "to keep me quiet...Marland Kitchena big cover up... To keep me from talking..." Pt reasurred that he will be safe there.  Pt reports he will not be safe anywhere "they want to kill me..." and is requesting to talk w/ the secret service.

## 2012-11-03 NOTE — ED Notes (Signed)
Visitor at bedside.

## 2012-11-03 NOTE — ED Notes (Signed)
PTAR contacted and is unable to move pt up the priority list at this time,  Will re-contact later.  Sheriff is aware.

## 2012-11-03 NOTE — ED Notes (Signed)
Pt discharged to [facility] via [facility] accompanied by [facility] with chart and personal belongings, condition stable at time of discharge.

## 2012-11-03 NOTE — ED Provider Notes (Signed)
Blood sugars stable. Vitals wnl--placement pending  Toy Baker, MD 11/03/12 [phone]

## 2012-11-04 LAB — URINE CULTURE

## 2012-12-22 ENCOUNTER — Encounter (HOSPITAL_COMMUNITY): Payer: Self-pay | Admitting: Emergency Medicine

## 2012-12-22 ENCOUNTER — Emergency Department (HOSPITAL_COMMUNITY)
Admission: EM | Admit: 2012-12-22 | Discharge: 2012-12-30 | Disposition: E | Payer: Medicare Other | Attending: Emergency Medicine | Admitting: Emergency Medicine

## 2012-12-22 DIAGNOSIS — Z79899 Other long term (current) drug therapy: Secondary | ICD-10-CM | POA: Insufficient documentation

## 2012-12-22 DIAGNOSIS — Z95810 Presence of automatic (implantable) cardiac defibrillator: Secondary | ICD-10-CM | POA: Insufficient documentation

## 2012-12-22 DIAGNOSIS — Z8701 Personal history of pneumonia (recurrent): Secondary | ICD-10-CM | POA: Insufficient documentation

## 2012-12-22 DIAGNOSIS — E785 Hyperlipidemia, unspecified: Secondary | ICD-10-CM | POA: Insufficient documentation

## 2012-12-22 DIAGNOSIS — E039 Hypothyroidism, unspecified: Secondary | ICD-10-CM | POA: Insufficient documentation

## 2012-12-22 DIAGNOSIS — I5022 Chronic systolic (congestive) heart failure: Secondary | ICD-10-CM | POA: Insufficient documentation

## 2012-12-22 DIAGNOSIS — I469 Cardiac arrest, cause unspecified: Secondary | ICD-10-CM | POA: Insufficient documentation

## 2012-12-22 DIAGNOSIS — Z950 Presence of cardiac pacemaker: Secondary | ICD-10-CM | POA: Insufficient documentation

## 2012-12-22 DIAGNOSIS — K219 Gastro-esophageal reflux disease without esophagitis: Secondary | ICD-10-CM | POA: Insufficient documentation

## 2012-12-22 DIAGNOSIS — Z87440 Personal history of urinary (tract) infections: Secondary | ICD-10-CM | POA: Insufficient documentation

## 2012-12-22 DIAGNOSIS — Z8719 Personal history of other diseases of the digestive system: Secondary | ICD-10-CM | POA: Insufficient documentation

## 2012-12-22 DIAGNOSIS — N400 Enlarged prostate without lower urinary tract symptoms: Secondary | ICD-10-CM | POA: Insufficient documentation

## 2012-12-22 DIAGNOSIS — E119 Type 2 diabetes mellitus without complications: Secondary | ICD-10-CM | POA: Insufficient documentation

## 2012-12-22 DIAGNOSIS — Z87448 Personal history of other diseases of urinary system: Secondary | ICD-10-CM | POA: Insufficient documentation

## 2012-12-22 DIAGNOSIS — Z9861 Coronary angioplasty status: Secondary | ICD-10-CM | POA: Insufficient documentation

## 2012-12-22 DIAGNOSIS — S88119A Complete traumatic amputation at level between knee and ankle, unspecified lower leg, initial encounter: Secondary | ICD-10-CM | POA: Insufficient documentation

## 2012-12-22 DIAGNOSIS — I10 Essential (primary) hypertension: Secondary | ICD-10-CM | POA: Insufficient documentation

## 2012-12-22 DIAGNOSIS — Q2733 Arteriovenous malformation of digestive system vessel: Secondary | ICD-10-CM | POA: Insufficient documentation

## 2012-12-22 DIAGNOSIS — Z8619 Personal history of other infectious and parasitic diseases: Secondary | ICD-10-CM | POA: Insufficient documentation

## 2012-12-22 DIAGNOSIS — Z860100 Personal history of colon polyps, unspecified: Secondary | ICD-10-CM | POA: Insufficient documentation

## 2012-12-22 DIAGNOSIS — Z8546 Personal history of malignant neoplasm of prostate: Secondary | ICD-10-CM | POA: Insufficient documentation

## 2012-12-22 DIAGNOSIS — Z794 Long term (current) use of insulin: Secondary | ICD-10-CM | POA: Insufficient documentation

## 2012-12-22 DIAGNOSIS — Z7902 Long term (current) use of antithrombotics/antiplatelets: Secondary | ICD-10-CM | POA: Insufficient documentation

## 2012-12-22 DIAGNOSIS — Z872 Personal history of diseases of the skin and subcutaneous tissue: Secondary | ICD-10-CM | POA: Insufficient documentation

## 2012-12-22 DIAGNOSIS — I252 Old myocardial infarction: Secondary | ICD-10-CM | POA: Insufficient documentation

## 2012-12-22 DIAGNOSIS — Z8659 Personal history of other mental and behavioral disorders: Secondary | ICD-10-CM | POA: Insufficient documentation

## 2012-12-22 DIAGNOSIS — I2510 Atherosclerotic heart disease of native coronary artery without angina pectoris: Secondary | ICD-10-CM | POA: Insufficient documentation

## 2012-12-22 DIAGNOSIS — Z8679 Personal history of other diseases of the circulatory system: Secondary | ICD-10-CM | POA: Insufficient documentation

## 2012-12-22 MED FILL — Medication: Qty: 1 | Status: AC

## 2012-12-30 NOTE — ED Notes (Signed)
Patient arrived with [facility] performing chest compression. No airways in place at this time.

## 2012-12-30 NOTE — ED Notes (Signed)
PER EMS- Pt from nursing home. Last seen normal 2300. Patient found pulseless and began CPR at 0130. Patient had difficult airway. Asytole on monitor. 8 rounds of Epi without any changes on the monitor. EMS also administered Narcan, Sodium bicarb, D10, IO in right [place].

## 2012-12-30 NOTE — ED Notes (Signed)
Patient performing cardiac ultrasound. MD states no cardiac wall movement. Announced time of death at 0227.
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
## 2012-12-30 DEATH — deceased

## 2014-06-07 NOTE — Telephone Encounter (Signed)
Close encounter
